# Patient Record
Sex: Female | Born: 1965
Health system: Southern US, Community
[De-identification: ages and names within clinical notes are randomized; demographics above are authoritative.]

## PROBLEM LIST (undated history)

## (undated) DIAGNOSIS — F32A Depression, unspecified: Secondary | ICD-10-CM

## (undated) DIAGNOSIS — I10 Essential (primary) hypertension: Secondary | ICD-10-CM

## (undated) DIAGNOSIS — I639 Cerebral infarction, unspecified: Secondary | ICD-10-CM

## (undated) DIAGNOSIS — M5136 Other intervertebral disc degeneration, lumbar region: Secondary | ICD-10-CM

## (undated) DIAGNOSIS — R569 Unspecified convulsions: Secondary | ICD-10-CM

## (undated) DIAGNOSIS — F329 Major depressive disorder, single episode, unspecified: Secondary | ICD-10-CM

## (undated) DIAGNOSIS — E785 Hyperlipidemia, unspecified: Secondary | ICD-10-CM

## (undated) DIAGNOSIS — M51369 Other intervertebral disc degeneration, lumbar region without mention of lumbar back pain or lower extremity pain: Secondary | ICD-10-CM

## (undated) DIAGNOSIS — Z8619 Personal history of other infectious and parasitic diseases: Secondary | ICD-10-CM

## (undated) HISTORY — DX: Depression, unspecified: F32.A

## (undated) HISTORY — DX: Essential (primary) hypertension: I10

## (undated) HISTORY — PX: GALLBLADDER SURGERY: SHX652

## (undated) HISTORY — DX: Personal history of other infectious and parasitic diseases: Z86.19

## (undated) HISTORY — PX: PARTIAL HYSTERECTOMY: SHX80

## (undated) HISTORY — DX: Major depressive disorder, single episode, unspecified: F32.9

---

## 1988-04-01 DIAGNOSIS — I639 Cerebral infarction, unspecified: Secondary | ICD-10-CM | POA: Insufficient documentation

## 1988-04-01 HISTORY — DX: Cerebral infarction, unspecified: I63.9

## 2000-09-26 ENCOUNTER — Emergency Department (HOSPITAL_COMMUNITY): Admission: EM | Admit: 2000-09-26 | Discharge: 2000-09-26 | Payer: Self-pay | Admitting: Internal Medicine

## 2000-09-26 ENCOUNTER — Encounter: Payer: Self-pay | Admitting: Internal Medicine

## 2000-11-20 ENCOUNTER — Encounter: Payer: Self-pay | Admitting: Family Medicine

## 2000-11-20 ENCOUNTER — Ambulatory Visit (HOSPITAL_COMMUNITY): Admission: RE | Admit: 2000-11-20 | Discharge: 2000-11-20 | Payer: Self-pay | Admitting: Family Medicine

## 2001-10-23 ENCOUNTER — Encounter: Payer: Self-pay | Admitting: Family Medicine

## 2001-10-23 ENCOUNTER — Ambulatory Visit (HOSPITAL_COMMUNITY): Admission: RE | Admit: 2001-10-23 | Discharge: 2001-10-23 | Payer: Self-pay | Admitting: Family Medicine

## 2001-12-06 ENCOUNTER — Emergency Department (HOSPITAL_COMMUNITY): Admission: EM | Admit: 2001-12-06 | Discharge: 2001-12-06 | Payer: Self-pay | Admitting: *Deleted

## 2002-10-29 ENCOUNTER — Emergency Department (HOSPITAL_COMMUNITY): Admission: EM | Admit: 2002-10-29 | Discharge: 2002-10-29 | Payer: Self-pay | Admitting: *Deleted

## 2003-01-28 ENCOUNTER — Inpatient Hospital Stay (HOSPITAL_COMMUNITY): Admission: EM | Admit: 2003-01-28 | Discharge: 2003-01-31 | Payer: Self-pay | Admitting: Internal Medicine

## 2003-02-04 ENCOUNTER — Inpatient Hospital Stay (HOSPITAL_COMMUNITY): Admission: RE | Admit: 2003-02-04 | Discharge: 2003-02-07 | Payer: Self-pay | Admitting: General Surgery

## 2003-02-12 ENCOUNTER — Emergency Department (HOSPITAL_COMMUNITY): Admission: EM | Admit: 2003-02-12 | Discharge: 2003-02-12 | Payer: Self-pay

## 2003-02-15 ENCOUNTER — Ambulatory Visit (HOSPITAL_COMMUNITY): Admission: RE | Admit: 2003-02-15 | Discharge: 2003-02-15 | Payer: Self-pay | Admitting: Family Medicine

## 2003-02-17 ENCOUNTER — Inpatient Hospital Stay (HOSPITAL_COMMUNITY): Admission: EM | Admit: 2003-02-17 | Discharge: 2003-02-19 | Payer: Self-pay | Admitting: Emergency Medicine

## 2003-04-25 ENCOUNTER — Inpatient Hospital Stay (HOSPITAL_COMMUNITY): Admission: EM | Admit: 2003-04-25 | Discharge: 2003-04-26 | Payer: Self-pay | Admitting: Emergency Medicine

## 2003-08-01 ENCOUNTER — Emergency Department (HOSPITAL_COMMUNITY): Admission: EM | Admit: 2003-08-01 | Discharge: 2003-08-01 | Payer: Self-pay | Admitting: Emergency Medicine

## 2004-09-21 ENCOUNTER — Emergency Department (HOSPITAL_COMMUNITY): Admission: EM | Admit: 2004-09-21 | Discharge: 2004-09-21 | Payer: Self-pay | Admitting: Emergency Medicine

## 2004-12-19 ENCOUNTER — Ambulatory Visit (HOSPITAL_COMMUNITY): Admission: RE | Admit: 2004-12-19 | Discharge: 2004-12-19 | Payer: Self-pay | Admitting: Family Medicine

## 2005-03-04 ENCOUNTER — Emergency Department (HOSPITAL_COMMUNITY): Admission: EM | Admit: 2005-03-04 | Discharge: 2005-03-04 | Payer: Self-pay | Admitting: Emergency Medicine

## 2005-08-19 ENCOUNTER — Emergency Department (HOSPITAL_COMMUNITY): Admission: EM | Admit: 2005-08-19 | Discharge: 2005-08-19 | Payer: Self-pay | Admitting: Emergency Medicine

## 2005-10-11 ENCOUNTER — Emergency Department (HOSPITAL_COMMUNITY): Admission: EM | Admit: 2005-10-11 | Discharge: 2005-10-11 | Payer: Self-pay | Admitting: Emergency Medicine

## 2005-12-18 ENCOUNTER — Emergency Department (HOSPITAL_COMMUNITY): Admission: EM | Admit: 2005-12-18 | Discharge: 2005-12-18 | Payer: Self-pay | Admitting: Emergency Medicine

## 2006-01-10 ENCOUNTER — Emergency Department (HOSPITAL_COMMUNITY): Admission: EM | Admit: 2006-01-10 | Discharge: 2006-01-10 | Payer: Self-pay | Admitting: Emergency Medicine

## 2006-02-09 ENCOUNTER — Emergency Department (HOSPITAL_COMMUNITY): Admission: EM | Admit: 2006-02-09 | Discharge: 2006-02-09 | Payer: Self-pay | Admitting: Emergency Medicine

## 2006-04-07 ENCOUNTER — Emergency Department (HOSPITAL_COMMUNITY): Admission: EM | Admit: 2006-04-07 | Discharge: 2006-04-07 | Payer: Self-pay | Admitting: Emergency Medicine

## 2006-07-06 ENCOUNTER — Emergency Department (HOSPITAL_COMMUNITY): Admission: EM | Admit: 2006-07-06 | Discharge: 2006-07-06 | Payer: Self-pay | Admitting: Emergency Medicine

## 2006-08-21 ENCOUNTER — Emergency Department (HOSPITAL_COMMUNITY): Admission: EM | Admit: 2006-08-21 | Discharge: 2006-08-21 | Payer: Self-pay | Admitting: Emergency Medicine

## 2006-09-18 ENCOUNTER — Emergency Department (HOSPITAL_COMMUNITY): Admission: EM | Admit: 2006-09-18 | Discharge: 2006-09-18 | Payer: Self-pay | Admitting: Emergency Medicine

## 2007-02-17 ENCOUNTER — Emergency Department (HOSPITAL_COMMUNITY): Admission: EM | Admit: 2007-02-17 | Discharge: 2007-02-17 | Payer: Self-pay | Admitting: Emergency Medicine

## 2007-03-18 ENCOUNTER — Emergency Department (HOSPITAL_COMMUNITY): Admission: EM | Admit: 2007-03-18 | Discharge: 2007-03-18 | Payer: Self-pay | Admitting: Emergency Medicine

## 2007-05-28 ENCOUNTER — Ambulatory Visit (HOSPITAL_COMMUNITY): Admission: RE | Admit: 2007-05-28 | Discharge: 2007-05-28 | Payer: Self-pay | Admitting: Family Medicine

## 2007-10-26 ENCOUNTER — Emergency Department (HOSPITAL_COMMUNITY): Admission: EM | Admit: 2007-10-26 | Discharge: 2007-10-26 | Payer: Self-pay | Admitting: Emergency Medicine

## 2008-02-03 ENCOUNTER — Emergency Department (HOSPITAL_COMMUNITY): Admission: EM | Admit: 2008-02-03 | Discharge: 2008-02-03 | Payer: Self-pay | Admitting: Emergency Medicine

## 2008-05-30 ENCOUNTER — Inpatient Hospital Stay (HOSPITAL_COMMUNITY): Admission: EM | Admit: 2008-05-30 | Discharge: 2008-06-01 | Payer: Self-pay | Admitting: Emergency Medicine

## 2008-12-08 ENCOUNTER — Emergency Department (HOSPITAL_COMMUNITY): Admission: EM | Admit: 2008-12-08 | Discharge: 2008-12-08 | Payer: Self-pay | Admitting: Emergency Medicine

## 2009-03-29 ENCOUNTER — Emergency Department (HOSPITAL_COMMUNITY): Admission: EM | Admit: 2009-03-29 | Discharge: 2009-03-29 | Payer: Self-pay | Admitting: Emergency Medicine

## 2009-04-25 ENCOUNTER — Ambulatory Visit (HOSPITAL_COMMUNITY): Admission: RE | Admit: 2009-04-25 | Discharge: 2009-04-25 | Payer: Self-pay | Admitting: Family Medicine

## 2009-09-07 ENCOUNTER — Emergency Department (HOSPITAL_COMMUNITY): Admission: EM | Admit: 2009-09-07 | Discharge: 2009-09-07 | Payer: Self-pay | Admitting: Emergency Medicine

## 2010-03-10 ENCOUNTER — Emergency Department (HOSPITAL_COMMUNITY)
Admission: EM | Admit: 2010-03-10 | Discharge: 2010-03-10 | Payer: Self-pay | Source: Home / Self Care | Admitting: Emergency Medicine

## 2010-03-16 ENCOUNTER — Emergency Department (HOSPITAL_COMMUNITY)
Admission: EM | Admit: 2010-03-16 | Discharge: 2010-03-16 | Payer: Self-pay | Source: Home / Self Care | Admitting: Emergency Medicine

## 2010-04-22 ENCOUNTER — Encounter: Payer: Self-pay | Admitting: Family Medicine

## 2010-06-19 ENCOUNTER — Other Ambulatory Visit (HOSPITAL_COMMUNITY): Payer: Self-pay | Admitting: Family Medicine

## 2010-06-19 DIAGNOSIS — Z139 Encounter for screening, unspecified: Secondary | ICD-10-CM

## 2010-06-21 ENCOUNTER — Ambulatory Visit (HOSPITAL_COMMUNITY): Admission: RE | Admit: 2010-06-21 | Payer: Medicare Other | Source: Ambulatory Visit

## 2010-07-01 ENCOUNTER — Emergency Department (HOSPITAL_COMMUNITY)
Admission: EM | Admit: 2010-07-01 | Discharge: 2010-07-02 | Disposition: A | Discharge status: Home / Self Care | Payer: Medicare Other | Attending: Emergency Medicine | Admitting: Emergency Medicine

## 2010-07-01 DIAGNOSIS — A5900 Urogenital trichomoniasis, unspecified: Secondary | ICD-10-CM | POA: Insufficient documentation

## 2010-07-01 DIAGNOSIS — J45909 Unspecified asthma, uncomplicated: Secondary | ICD-10-CM | POA: Insufficient documentation

## 2010-07-01 DIAGNOSIS — E7800 Pure hypercholesterolemia, unspecified: Secondary | ICD-10-CM | POA: Insufficient documentation

## 2010-07-01 DIAGNOSIS — Z8673 Personal history of transient ischemic attack (TIA), and cerebral infarction without residual deficits: Secondary | ICD-10-CM | POA: Insufficient documentation

## 2010-07-01 DIAGNOSIS — R509 Fever, unspecified: Secondary | ICD-10-CM | POA: Insufficient documentation

## 2010-07-01 LAB — URINALYSIS, ROUTINE W REFLEX MICROSCOPIC
Glucose, UA: NEGATIVE mg/dL
Hgb urine dipstick: NEGATIVE
Specific Gravity, Urine: 1.025 (ref 1.005–1.030)
Urobilinogen, UA: 2 mg/dL — ABNORMAL HIGH (ref 0.0–1.0)

## 2010-07-01 LAB — URINE MICROSCOPIC-ADD ON

## 2010-07-03 LAB — URINE CULTURE: Colony Count: NO GROWTH

## 2010-07-12 LAB — BLOOD GAS, ARTERIAL
Acid-base deficit: 7 mmol/L — ABNORMAL HIGH (ref 0.0–2.0)
Bicarbonate: 17.2 mEq/L — ABNORMAL LOW (ref 20.0–24.0)
FIO2: 21 %
O2 Content: 21 L/min
O2 Saturation: 90.4 %
Patient temperature: 37
TCO2: 15.9 mmol/L (ref 0–100)
pCO2 arterial: 29.7 mmHg — ABNORMAL LOW (ref 35.0–45.0)
pH, Arterial: 7.381 (ref 7.350–7.400)
pO2, Arterial: 61 mmHg — ABNORMAL LOW (ref 80.0–100.0)

## 2010-07-12 LAB — CBC
HCT: 38 % (ref 36.0–46.0)
HCT: 38.8 % (ref 36.0–46.0)
HCT: 41.9 % (ref 36.0–46.0)
Hemoglobin: 12.8 g/dL (ref 12.0–15.0)
Hemoglobin: 13.3 g/dL (ref 12.0–15.0)
Hemoglobin: 14.1 g/dL (ref 12.0–15.0)
MCHC: 33.5 g/dL (ref 30.0–36.0)
MCHC: 33.8 g/dL (ref 30.0–36.0)
MCHC: 34.4 g/dL (ref 30.0–36.0)
MCV: 93 fL (ref 78.0–100.0)
MCV: 93.1 fL (ref 78.0–100.0)
MCV: 93.3 fL (ref 78.0–100.0)
Platelets: 200 10*3/uL (ref 150–400)
Platelets: 231 K/uL (ref 150–400)
RBC: 4.07 MIL/uL (ref 3.87–5.11)
RBC: 4.17 MIL/uL (ref 3.87–5.11)
RBC: 4.5 MIL/uL (ref 3.87–5.11)
RDW: 13 % (ref 11.5–15.5)
RDW: 13.1 % (ref 11.5–15.5)
RDW: 13.3 % (ref 11.5–15.5)
WBC: 11.9 K/uL — ABNORMAL HIGH (ref 4.0–10.5)
WBC: 6.3 10*3/uL (ref 4.0–10.5)

## 2010-07-12 LAB — BASIC METABOLIC PANEL
CO2: 18 mEq/L — ABNORMAL LOW (ref 19–32)
CO2: 24 mEq/L (ref 19–32)
Chloride: 100 mEq/L (ref 96–112)
Chloride: 113 mEq/L — ABNORMAL HIGH (ref 96–112)
GFR calc Af Amer: >60 mL/min (ref >60)
GFR calc Af Amer: >60 mL/min (ref >60)
Glucose, Bld: 119 mg/dL — ABNORMAL HIGH (ref 70–99)
Potassium: 3.6 mEq/L (ref 3.5–5.1)
Potassium: 3.8 mEq/L (ref 3.5–5.1)
Sodium: 134 mEq/L — ABNORMAL LOW (ref 135–145)
Sodium: 140 mEq/L (ref 135–145)

## 2010-07-12 LAB — DIFFERENTIAL
Basophils Absolute: 0 10*3/uL (ref 0.0–0.1)
Basophils Relative: 0 % (ref 0–1)
Basophils Relative: 0 % (ref 0–1)
Eosinophils Absolute: 0 10*3/uL (ref 0.0–0.7)
Eosinophils Absolute: 0 10*3/uL (ref 0.0–0.7)
Eosinophils Relative: 0 % (ref 0–5)
Eosinophils Relative: 0 % (ref 0–5)
Lymphocytes Relative: 15 % (ref 12–46)
Lymphocytes Relative: 7 % — ABNORMAL LOW (ref 12–46)
Lymphs Abs: 0.6 10*3/uL — ABNORMAL LOW (ref 0.7–4.0)
Lymphs Abs: 0.8 10*3/uL (ref 0.7–4.0)
Lymphs Abs: 1.1 10*3/uL (ref 0.7–4.0)
Monocytes Absolute: 0.2 10*3/uL (ref 0.1–1.0)
Monocytes Absolute: 0.6 10*3/uL (ref 0.1–1.0)
Monocytes Relative: 2 % — ABNORMAL LOW (ref 3–12)
Monocytes Relative: 9 % (ref 3–12)
Neutro Abs: 5.4 10*3/uL (ref 1.7–7.7)
Neutrophils Relative %: 76 % (ref 43–77)
Neutrophils Relative %: 88 % — ABNORMAL HIGH (ref 43–77)
Neutrophils Relative %: 90 % — ABNORMAL HIGH (ref 43–77)

## 2010-07-12 LAB — CULTURE, BLOOD (ROUTINE X 2)
Culture: NO GROWTH
Culture: NO GROWTH
Report Status: 3062010
Report Status: 3062010

## 2010-07-12 LAB — BASIC METABOLIC PANEL WITH GFR
BUN: 11 mg/dL (ref 6–23)
CO2: 20 meq/L (ref 19–32)
Calcium: 8.6 mg/dL (ref 8.4–10.5)
Chloride: 112 meq/L (ref 96–112)
Creatinine, Ser: 0.71 mg/dL (ref 0.4–1.2)
GFR calc non Af Amer: >60 mL/min
Glucose, Bld: 177 mg/dL — ABNORMAL HIGH (ref 70–99)
Potassium: 3.9 meq/L (ref 3.5–5.1)
Sodium: 140 meq/L (ref 135–145)

## 2010-07-12 LAB — PROTIME-INR: INR: 1 (ref 0.00–1.49)

## 2010-08-14 NOTE — Discharge Summary (Signed)
[name], [name] NO.:  [id number]   MEDICAL RECORD NO.:  [id number]          PATIENT TYPE:  INP   LOCATION:  A310                          FACILITY:  [facility]   PHYSICIAN:  Dorris Singh, DO    DATE OF BIRTH:  [birth year]   DATE OF ADMISSION:  05/30/2008  DATE OF DISCHARGE:  03/03/2010LH                               DISCHARGE SUMMARY   PRIMARY CARE PHYSICIAN:  Dr. Loleta Chance.   TESTING:  Portable chest which shows new linear opacities in both  perihilar regions which may be due to atelectasis or bronchial  pneumonia.   Her H and P was done by Dr. Elige Radon.  Please refer to HPI.  She was  admitted for community acquired pneumonia and exacerbation of asthma.   DISCHARGE DIAGNOSES:  1. Acute exacerbation of asthma, resolved.  2. Pneumonia, resolving.  3. History of reflux, depression, cerebrovascular accident, high      cholesterol.   HOSPITAL COURSE:  The patient was admitted with the above diagnoses.  She was started on IV steroids as well as breathing treatments around  the clock.  She continued to improve without any incidents.  I also  talked with the patient regarding her medications that she has at home.  She just has inhalers.  We will give her a prescription for a nebulizer  to use as well as nebulizing solution.  We will have her follow up with  her doctor.   DISCHARGE MEDICATIONS:  1. The patient did not bring in a list from home but taking this from      her H and P, Depakote 250 mg b.i.d.  2. Albuterol inhaler as needed.  3. Singulair 10 mg once a day.  4. Simvastatin 20 mg at bedtime.  5. She will be sent home on 3 new medications which include albuterol      2.5 mg to use with nebulizer treatment 4 times a day p.r.n.  6. Medrol Dosepak which she will use as directed.  7. Levaquin 750 one p.o. daily x7 days.   DISCHARGE INSTRUCTIONS:  She is to increase her activity slowly.  She is  to remain on a regular diet with no restrictions.  She is to  see Dr.  Margo Aye within the next 1-5 days for followup.  She is to continue the  nebulizer treatments  as directed and all medications to resume as directed from previous.  If  her symptoms worsen she is instructed to follow back up with her primary  care physician or to return to the emergency room for evaluation.   DISPOSITION:  Stable.  She will be discharged to home.      Dorris Singh, DO  Electronically Signed     CB/MEDQ  D:  06/01/2008  T:  06/01/2008  Job:  [id number]

## 2010-08-14 NOTE — H&P (Signed)
Patty [name], WARDA NO.:  [id number]   MEDICAL RECORD NO.:  [id number]          PATIENT TYPE:  INP   LOCATION:  A310                          FACILITY:  [facility]   PHYSICIAN:  Dorris Singh, DO    DATE OF BIRTH:  07-02-1965   DATE OF ADMISSION:  05/30/2008  DATE OF DISCHARGE:  LH                              HISTORY & PHYSICAL   CHIEF COMPLAINT:  The patient is a 45 year old African American female  who presented to [facility] emergency room with the chief complaint of  shortness of breath.  She apparently stated that over the last couple  days she had noticed that her asthma, for which she takes inhalers, was  not  responding to her use of inhalers.  She currently is a patient of  Dr. Loleta Chance so she came in to be evaluated.  She was found to have a  questionable pneumonia and exacerbation of her asthma with pyrexia.   PAST MEDICAL HISTORY:  1. Significant for having some acid reflux.  2. CVA.  3. Depression.  4. High cholesterol.  5. Seizure disorder.   FAMILY HISTORY:  Hypertension.   SURGICAL HISTORY:  She has had cesarean section and hysterectomy.   SOCIAL HISTORY:  Nonsmoker, no drug use.  Occasional drinker.  Does have  a boyfriend.   ALLERGIES:  No known drug allergies.   CURRENT MEDICATIONS:  1. Depakote 250 mg t.i.d..  2. Albuterol inhaler as needed.  3. Singulair 10 mg once a day.  4. Simvastatin 20 mg at bedtime.   REVIEW OF SYSTEMS:  CONSTITUTIONAL:  Positive for fever.  Negative for  weight loss, chills.  EYES:  Negative for eye pain or visual changes.  NOSE, MOUTH, THROAT:  Negative for ear pain.  Denies sinus congestion.  CARDIOVASCULAR:  Denies chest pain or dyspnea.  RESPIRATORY:  Positive  for dyspnea and wheezing and shortness of breath.  GI:  Nausea and  vomiting.  GU:  Negative dysuria and flank pain.  MUSCULOSKELETAL:  Negative for arthralgias or back pain.  SKIN:  Positive for pruritus and  rash.  NEURO:  Negative for headache or  weakness.   LABORATORY DATA:  White count of 7.1,  hemoglobin 14.1 hematocrit 41.9,  platelet count 198,000.  BMP shows sodium 134, potassium 3.6, chloride  100, carbon dioxide 24, glucose 119, BUN 10, creatinine 0.93.   Chest x-ray, one-view, shows new opacity in both perihilar regions which  may be due to atelectasis or bronchopneumonia.   ASSESSMENT/PLAN:  Community-acquired pneumonia, acute exacerbation of  asthma.   PLAN:  Admit the patient to service of [facility] and start her on DVT  and GI prophylaxis.  She was given one dose of Rocephin and Zithromax.  We will continue that while she is here.  Will continue nebulizer  treatments, albuterol and Atrovent, as well as IV hydration and  antitussives.  I will continue to monitor her.  If she is greatly  improved, she may be able to be discharged next 24 hours.  However, she  may need to stay for a duration  longer than that depending on how she  progresses.      Dorris Singh, DO  Electronically Signed     CB/MEDQ  D:  05/30/2008  T:  05/30/2008  Job:  [id number]
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
## 2010-08-17 NOTE — Op Note (Signed)
NAME:  Patty, [name]                            ACCOUNT NO.:  [id number]   MEDICAL RECORD NO.:  [id number]                   PATIENT TYPE:  AMB   LOCATION:  DAY                                  FACILITY:  [facility]   PHYSICIAN:  Jerolyn Shin C. Katrinka Blazing, M.D.                DATE OF BIRTH:  1966-02-27   DATE OF PROCEDURE:  DATE OF DISCHARGE:                                 OPERATIVE REPORT   PREOPERATIVE DIAGNOSES:  1. Severe dysfunctional uterine bleeding.  2. Massive uterine fibroids.  3. Severe anemia.   POSTOPERATIVE DIAGNOSES:  1. Severe dysfunctional uterine bleeding.  2. Massive uterine fibroids.  3. Severe anemia.  4. Cholelithiasis.   PROCEDURE:  Total abdominal hysterectomy.   DESCRIPTION OF PROCEDURE:  Under general anesthesia, the patient's abdomen  was prepped and draped in a sterile field.   A midline incision was made and extended above the umbilicus because of the  large size of the uterus.  The uterus extended about four fingerbreadths  above the umbilicus, and it could not be pulled from the pelvis without a  larger incision.  Once the uterus was pulled from the pelvis, evaluation of  the upper abdomen was done.  The patient was found to have two large  gallstones in the gallbladder without any palpable gallbladder wall  thickening.  No palpable pericholecystic adhesions.  The uterus was grasped  with a tenaculum.  The round ligaments were isolated, doubly clamped, tied,  and divided.  The tubo-ovarian complex was doubly clamped at its junction  with the uterus bilaterally.  Each was controlled with a Heaney ligature of  0 Dexon.  The initial dissection was started on the left side.  The  infundibulopelvic ligament was significantly vascularized due to the massive  size of the uterus.  IT was doubly clamped, divided, and controlled with  ligatures of 0 Dexon.  The uterine vessels were singly clamped, divided, and  controlled with ligatures of 0 Dexon.  A lot of the end  vessels had to be  sutured because of significant backbleeding.  There was major backbleeding  all the way down to the cervix.  Estimated blood loss was about 1300 cc.  There were large major vessels both arterial and venous.  Once the  dissection was continued down to the apex of the vagina, the apex of the  vagina was clamped with a curved Heaney clamp, incised, and then closed with  a ligature of 0 Dexon.  The uterus was excised.  Hemostasis was achieved.  The cuff was oversewn using running locking 0 Biosyn.  The pelvis was  irrigated.  The pelvis was then reperitonealized using 3-0 Biosyn.  The  ovaries were inspected, and there was no evidence of bleeding there.  Sponge, needle, instrument, and blade counts were verified as correct.  The  fascia was closed using running #1 Prolene.  The subcutaneous tissue  was  closed in two layers using 2-0 Biosyn.  The skin was closed with staples.   The patient tolerated the procedure well.  She was awakened from anesthesia  uneventfully, transferred to a bed, and taken to the postanesthetic care  unit in satisfactory condition.      ___________________________________________                                            Dirk Dress Katrinka Blazing, M.D.   LCS/MEDQ  D:  02/04/2003  T:  02/04/2003  Job:  [id number]

## 2010-08-17 NOTE — Discharge Summary (Signed)
   NAME:  Patty [name], Patty [name]                            ACCOUNT NO.:  [id number]   MEDICAL RECORD NO.:  [id number]                   PATIENT TYPE:  INP   LOCATION:  A311                                 FACILITY:  [facility]   PHYSICIAN:  Vania Rea, M.D.              DATE OF BIRTH:  1965/07/21   DATE OF ADMISSION:  01/28/2003  DATE OF DISCHARGE:                                 DISCHARGE SUMMARY   DISCHARGE DIAGNOSES:  1. Anemia status post transfusion of 2 units of packed red cells.  2. Menorrhagia, not currently bleeding.  3. Large uterine fibroids, total abdominal hysterectomy pending.  4. Seizure disorder.  5. History of cerebrovascular accident.  6. Asthma.  7. History of oral Candida; human immunodeficiency virus test negative.   DISPOSITION:  Discharged to home.   CONDITION:  Stable.   DISCHARGE MEDICATIONS:  1. Ferrous sulfate 325 mg three times daily.  2. Depakote 250 mg three times daily.  3. Albuterol inhaler two puffs q.4h. when necessary.   Please refer to History and Physical of January 28, 2003.  The patient was  admitted with a one-day history of dizziness and presyncope.  The patient  gave a long history of menorrhagia.  The patient was found to have uterine  fibroids.  The patient's hemoglobin was found to be 6 on admission.  The  patient was not bleeding at the time of admission.  Blood work suggested a  microcytic anemia.  She was transfused 2 units of blood and was offered  total abdominal hysterectomy by the consulting surgeon, Dr. Elpidio Anis.  The patient agreed and was kept.  The surgery was scheduled for today.  However, today the patient has declined surgery, stating personal problems  needing to be attended to.  The patient is discharged with last hemoglobin  of 10, hematocrit 31.5.  She is ambulating without  difficulty.  She feels much better than when she was admitted.  Naturally  she has been advised to repeat her CBC on Thursday, to return for an  elective TAH on Friday of this week by Dr. Katrinka Blazing.   FOLLOW-UP:  By her primary care physician, Dr. Mirna Mires, and for surgery  on November 5 with Dr. Katrinka Blazing.     ___________________________________________                                         Vania Rea, M.D.   LC/MEDQ  D:  01/31/2003  T:  01/31/2003  Job:  [id number]   cc:   Annia Friendly. Loleta Chance, M.D.  [address]  [place]  [place] [address]  Fax: [phone]   Dirk Dress. Katrinka Blazing, M.D.  [address]  [place]  [place] [address]  Fax: [phone]

## 2010-08-17 NOTE — H&P (Signed)
   NAME:  Patty [name], Patty [name]                            ACCOUNT NO.:  [id number]   MEDICAL RECORD NO.:  [id number]                   PATIENT TYPE:  AMB   LOCATION:  DAY                                  FACILITY:  [facility]   PHYSICIAN:  Jerolyn Shin C. Katrinka Blazing, M.D.                DATE OF BIRTH:  28-Dec-1965   DATE OF ADMISSION:  DATE OF DISCHARGE:                                HISTORY & PHYSICAL   ADDENDUM:  [age]-year-old female admitted on 28 January 2003 with  severe anemia.  The patient has a long history of dysfunctional uterine  bleeding and menometrorrhagia.  She has had large uterine fibroid dating  back to 2002.  She was advised at that time to have hysterectomy.  She has  continued to be symptomatic.  The patient was admitted and transfused.  After transfusion, she was advised to have surgery but she wished to make  arrangement for her children.  Those problems have been completed and she is  now scheduled for total abdominal hysterectomy.     ___________________________________________                                         Dirk Dress Katrinka Blazing, M.D.   LCS/MEDQ  D:  02/03/2003  T:  02/04/2003  Job:  [id number]

## 2010-08-17 NOTE — Discharge Summary (Signed)
NAME:  Patty [name], Patty [name]                            ACCOUNT NO.:  [id number]   MEDICAL RECORD NO.:  [id number]                   PATIENT TYPE:  INP   LOCATION:  A322                                 FACILITY:  [facility]   PHYSICIAN:  Hanley Hays. Dechurch, M.D.           DATE OF BIRTH:  1965/12/19   DATE OF ADMISSION:  04/25/2003  DATE OF DISCHARGE:  04/26/2003                                 DISCHARGE SUMMARY   DIAGNOSES:  1. Status asthmaticus.  2. Probable reactive airway disease.  3. History of seizure disorder.  4. History of cerebrovascular accident.  5. History of hypoxic encephalopathy.  6. Status post total abdominal hysterectomy November 2004.   DISPOSITION:  The patient discharged to home.  Follow up with Dr. Loleta Chance in 2  weeks.   CONDITION:  Much improved.   DISCHARGE MEDICATIONS:  1. Prednisone 30 mg daily x2 days, 20 mg daily x3 days, 10 mg daily x3 days,     then discontinue.  2. Advair 250/50 one puff b.i.d.  3. Albuterol 2 puffs q.4h. p.r.n. wheezing cough.  4. Zithromax daily until gone.  5. Depakote 250 t.i.d.  6. The patient instructed to STOP her iron tablets.   HOSPITAL COURSE:  A 45 year old African-American female followed by Dr. Loleta Chance  who presented to the hospital with respiratory distress which was  progressive over the previous 3-4 days to the point where she could not get  her breath.  She was in moderate respiratory distress at the time of  presentation.  She was tachycardic with rates to 110.  Her O2 saturations  were 91% on room air, but she was working significantly to maintain that.  Chest x-ray revealed no evidence of acute disease.  She was treated in the  emergency room with Solu-Medrol nebulizer therapy, but due to ongoing  respiratory distress was admitted to the hospital. Over the course of the  next 24 hours she had significant improvement with much decreased wheezing.  She still has some scattered rhonchi in the right lower lobe at the time of  discharge, but she is able to move about and feels much much better.  She is  being discharged to home with the follow up as noted above.   PHYSICAL EXAMINATION:  GENERAL:  Exam reveals a moderately obese female in  no distress.  VITAL SIGNS:  No fever with a T-max of 97.9.  Blood pressure is 118/72,  pulse is 90 and regular.  Respirations are unlabored.  Oropharynx is moist,  no thrush.  LUNGS:  Diminished with improved breath sounds, a few scattered fine rhonchi  in the right base, but otherwise much improved. No rales are present.  EXTREMITIES:  No edema is present.  NEUROLOGIC:  Status is baseline, intact, appropriate, and nonfocal.  ABDOMEN:  Obese, soft, and nontender.   ASSESSMENT AND PLAN:  As noted above.  The patient was instructed on the use  of Advair and albuterol for rescue treatment.  Her appointment with Dr. Loleta Chance  was rescheduled for 2 weeks.  She seemed to have good understanding.  My  phone number was supplied to the patient at the time of discharge in case  she should have any questions or recurrences.     ___________________________________________                                         Hanley Hays Josefine Class, M.D.   FED/MEDQ  D:  04/26/2003  T:  04/26/2003  Job:  [id number]   cc:   Annia Friendly. Loleta Chance, M.D.  [address]  [place]  [place] [address]  Fax: [phone]

## 2010-08-17 NOTE — Discharge Summary (Signed)
NAME:  Patty [name], Patty [name]                            ACCOUNT NO.:  [id number]   MEDICAL RECORD NO.:  [id number]                   PATIENT TYPE:  INP   LOCATION:  A203                                 FACILITY:  [facility]   PHYSICIAN:  Annia Friendly. Loleta Chance, M.D.                DATE OF BIRTH:  Feb 23, 1966   DATE OF ADMISSION:  02/17/2003  DATE OF DISCHARGE:  02/19/2003                                 DISCHARGE SUMMARY   The patient was a 45 year old, divorced, gravida 2, para 2, AB 0, disabled,  black female from [place], [place].  The patient was transported  to the emergency room at [facility] by EMS for treatment of  shortness of breath.  EMS was summoned to the patient's home on the morning  of admission.  EMS found the patient sitting in a room and she was observed  to be very short of breath and in distress.  O2 saturation on room air was  74%.  She was given albuterol neb treatment with improvement and O2  saturation to 99% with a mask.  Also, an IV was started and ER physician  gave permission for IV Solu-Medrol 125 mg IV.  It should be noted the  patient had been seen in the emergency room, on February 12, 2003, for  shortness of breath and wheezing.  She indicated that treatment was a neb  treatment, and she was released.  History is also positive for patient being  seen in the office, on November 16 or 17, 2004, for a productive cough,  wheezing and a complaint of shortness of breath.  Her O2 saturation in the  office was 93% on room air.  The patient was treated in the office with  antibiotics, antitussive medication and metered-dose inhaler.  The patient  also received Xopenex neb treatment with each visit in the office.  ABGs, on  February 15, 2003, room air revealed the following:  pH 7.49, pCO2 32.3, pO2  71.7, O2 sat 99.6%.   HABITS:  Negative for tobacco, ethanol and street drugs.   PAST MEDICAL HISTORY:  1. Positive hospitalization for hysterectomy secondary to  dysfunctional     uterine bleeding, on February 12, 2003.  2. Positive hospitalization for hypoxic encephalopathy secondary to, with     residual right chronic hemiparesis and seizure disorder.  3. Positive hospitalization for pregnancy x2 (C-section with second     pregnancy secondary to fetal distress).  4. Bilateral tubal ligation by Dr. Kate Sable at [facility], on     August 31.   As a part of the workup, by the ER, on February 17, 2003, a CT of the chest  was ordered.  A spiral CT of the chest was negative for pulmonary embolic  disease.  Chest x-ray was read normal by ER physician.  A venous Doppler  study of the lower extremities, on February 17, 2003, was negative.   The patient was not allergic to any known medication.   FAMILY HISTORY:  Reveals mother living at age 46 with a history of  hypertension.  Father deceased secondary to a complication of stroke.  Seven  sisters living, health unknown.  One sister deceased, age 31, secondary to  being hit by a car.  Six brothers living, health unknown.  One son living,  age 74, with a history of asthma.  One daughter living, age 81, good health.   PROBLEM LIST:  Problem #1.  Shortness of breath secondary to acute  bronchitis with bronchospasm.   PHYSICAL EXAMINATION:  VITAL SIGNS:  On admission were as follows:  Temperature 99.0, pulse 112, respirations 20, blood pressure 123/63.  GENERAL APPEARANCE:  Revealed a middle-aged, slightly short, alert, medium  framed black female in no apparent respiratory distress or arrest.  NOSE:  Negative for discharge.  EYES:  Demonstrated no discharge.  Sclera were white.  THROAT:  Posterior pharynx was benign.  NECK:  Negative for lymphadenopathy.  LUNGS:  Positive bilateral inspiratory wheezes.  The patient was moving air  fairly on auscultation.  HEART:  Revealed audible S1 and S2 without murmur.  Rhythm was regular and  rate was 108.  EXTREMITIES:  Demonstrated no cyanosis or  clubbing.   SIGNIFICANT LABS ON ADMISSION:  As follows:  ABGs on room air, pH 7.39, pCO2  39.9, pO2 53.3, and O2 sat 86.5%.  D-dimmer was 1.49 (normal 0-0.4 mcg/ml),  on February 17, 2003.  Labs, on February 12, 2003, revealed the following:  Sodium 140, potassium 3.8, chloride 106, CO2 27, glucose 95, BUN 5,  creatinine 0.6, calcium 8.9.  White count 11.5, hemoglobin 11.9, hematocrit  36.6 and platelets 628,000.   The patient was treated with IV fluids, IV Solu-Medrol, Alupent/Atrovent neb  treatment q.4h., IV antibiotic, oxygen at 4 liters per minute via nasal  cannula, mucolytic agent, antitussive medicine and other supportive  measures.  The patient's hospital course was uneventful.  She improved daily  with treatment.  On the day of discharge her lungs were clear.  She was  alert and oriented to person, place and time.  She had no complaints of  shortness of breath at rest or ambulating in room.  ABGs on room air, on  February 19, 2003, demonstrated the following:  pH 7.40, pCO2 33.8, pO2  82.6, O2 sat 96.7%.  Appetite was good.  The patient was discharged to her  home on February 19, 2003.   1. Chronic right hemiparesis:  Examination of extremities on admission     revealed decreased strength involving the right upper and lower     extremities.  The patient demonstrated spastic movement of her right     upper extremity.  Fine motor movement of fingers was poor.  The patient     admitted to feeding herself with the left hand.  She demonstrated no     motor problem or trouble with control or strength pertaining to the left     upper and lower extremities.  Speech was slow but clear.   1. Seizure disorder:  Valproic acid level on admission was 63.6 ng/ml.  The     patient was continued on Depakote 250 mg one tablet every morning, two     tablets p.o. every bedtime.  She demonstrated no seizure activity during    this hospitalization.  She was always alert and oriented  to person,      place  and time.  Short and long term memory were intact.   DISCHARGE INSTRUCTIONS:   DIET:  Regular.   ACTIVITY:  As tolerated.   MEDICATIONS:  1. Albuterol metered-dose inhaler two puffs every four to six hours with     AeroChamber.  2. Doxycycline 100 mg p.o. b.i.d.  3. Tussi-Organidin NR two teaspoons p.o. q.i.d.  4. Depakote 250 mg one tablet every morning and two tablets p.o. every     bedtime.  5. Prednisone 5 mg p.o. t.i.d. x 5 days.   FINAL PRIMARY DIAGNOSIS:  Shortness of breath secondary to acute bronchitis  with bronchospasm.   SECONDARY DIAGNOSES:  1. Chronic right hemiparesis secondary to hypoxic encephalopathy.  2. Seizure disorder.     ___________________________________________                                         Annia Friendly. Loleta Chance, M.D.   Levonne Hubert  D:  02/19/2003  T:  02/20/2003  Job:  [id number]
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
## 2010-08-17 NOTE — Discharge Summary (Signed)
NAME:  Patty [name], Patty [name]                            ACCOUNT NO.:  [id number]   MEDICAL RECORD NO.:  [id number]                   PATIENT TYPE:  INP   LOCATION:  A321                                 FACILITY:  [facility]   PHYSICIAN:  Dirk Dress. Katrinka Blazing, M.D.                DATE OF BIRTH:  1965-05-26   DATE OF ADMISSION:  02/04/2003  DATE OF DISCHARGE:  02/07/2003                                 DISCHARGE SUMMARY   DISCHARGE DIAGNOSES:  1. Dysfunctional uterine bleeding.  2. Uterine fibroids.  3. Cholelithiasis.  4. Hemorrhagic anemia.  5. Seizure disorder.  6. History of asthma.   DISPOSITION:  The patient is discharged home in stable and satisfactory  condition.   DISCHARGE MEDICATIONS:  1. Tylox one to two every four hours as needed for pain.  2. Depakote 250 mg t.i.d.  3. Albuterol inhaler two puffs q.i.d.   HISTORY OF PRESENT ILLNESS:  This is a 45 year old female who presented on  October 29, with complaint of dizziness and weakness.  She had a long  history of uterine fibroids and severe menorrhagia.  She had significant  orthostatic dizziness and was seen in the emergency room.  She was noted to  have a hemoglobin of 6.2 with hematocrit of 20.7.  The patient was admitted,  transfused and had consultation for hysterectomy obtained.  She was advised  to have hysterectomy, but she needed to make arrangements for the children.   HOSPITAL COURSE:  She was treated until her hemoglobin was stabilized.  Once  hemoglobin was stabilized, the patient was discharged home.  She was  readmitted on November 5, and underwent hysterectomy.  At the time of  hysterectomy, she was found to have multiple gallstones.  She had a very  large uterus and had 1300 cc of blood loss mostly from back bleeding.  She  was transfused two units of packed red cells during that procedure.  Her  post procedure hemoglobin was 11.3.  She had no further problems.  She had  no respiratory difficulty.  By November 8, she was  stable and hemoglobin was  10.4.  She was discharged home in satisfactory condition.     ___________________________________________                                         Dirk Dress Katrinka Blazing, M.D.   LCS/MEDQ  D:  04/03/2003  T:  04/04/2003  Job:  [id number]

## 2010-09-19 ENCOUNTER — Emergency Department (HOSPITAL_COMMUNITY): Payer: Medicare Other

## 2010-09-19 ENCOUNTER — Emergency Department (HOSPITAL_COMMUNITY)
Admission: EM | Admit: 2010-09-19 | Discharge: 2010-09-19 | Disposition: A | Discharge status: Home / Self Care | Payer: Medicare Other | Attending: Emergency Medicine | Admitting: Emergency Medicine

## 2010-09-19 DIAGNOSIS — IMO0002 Reserved for concepts with insufficient information to code with codable children: Secondary | ICD-10-CM | POA: Insufficient documentation

## 2010-09-19 LAB — DIFFERENTIAL
Basophils Absolute: 0 K/uL (ref 0.0–0.1)
Basophils Relative: 0 % (ref 0–1)
Eosinophils Absolute: 0.3 K/uL (ref 0.0–0.7)
Eosinophils Relative: 6 % — ABNORMAL HIGH (ref 0–5)
Lymphocytes Relative: 42 % (ref 12–46)
Lymphs Abs: 2.3 10*3/uL (ref 0.7–4.0)
Monocytes Absolute: 0.4 K/uL (ref 0.1–1.0)
Monocytes Relative: 6 % (ref 3–12)
Neutro Abs: 2.6 10*3/uL (ref 1.7–7.7)
Neutrophils Relative %: 46 % (ref 43–77)

## 2010-09-19 LAB — CBC
HCT: 40.5 % (ref 36.0–46.0)
Hemoglobin: 13.6 g/dL (ref 12.0–15.0)
MCH: 31.1 pg (ref 26.0–34.0)
MCHC: 33.6 g/dL (ref 30.0–36.0)
MCV: 92.7 fL (ref 78.0–100.0)
Platelets: 260 K/uL (ref 150–400)
RBC: 4.37 MIL/uL (ref 3.87–5.11)
RDW: 13.2 % (ref 11.5–15.5)
WBC: 5.6 K/uL (ref 4.0–10.5)

## 2010-09-19 LAB — BASIC METABOLIC PANEL
CO2: 26 mEq/L (ref 19–32)
Calcium: 10.1 mg/dL (ref 8.4–10.5)
Creatinine, Ser: 0.66 mg/dL (ref 0.50–1.10)
Glucose, Bld: 92 mg/dL (ref 70–99)
Sodium: 138 mEq/L (ref 135–145)

## 2010-09-19 LAB — BASIC METABOLIC PANEL WITH GFR
BUN: 12 mg/dL (ref 6–23)
Chloride: 103 meq/L (ref 96–112)
GFR calc Af Amer: >60 mL/min (ref >60)
GFR calc non Af Amer: >60 mL/min (ref >60)
Potassium: 3.5 meq/L (ref 3.5–5.1)

## 2010-10-18 ENCOUNTER — Emergency Department (HOSPITAL_COMMUNITY)
Admission: EM | Admit: 2010-10-18 | Discharge: 2010-10-18 | Disposition: A | Discharge status: Home / Self Care | Payer: Medicare Other | Attending: Emergency Medicine | Admitting: Emergency Medicine

## 2010-10-18 ENCOUNTER — Encounter: Payer: Self-pay | Admitting: Emergency Medicine

## 2010-10-18 DIAGNOSIS — N739 Female pelvic inflammatory disease, unspecified: Secondary | ICD-10-CM | POA: Insufficient documentation

## 2010-10-18 DIAGNOSIS — Z8673 Personal history of transient ischemic attack (TIA), and cerebral infarction without residual deficits: Secondary | ICD-10-CM | POA: Insufficient documentation

## 2010-10-18 DIAGNOSIS — E785 Hyperlipidemia, unspecified: Secondary | ICD-10-CM | POA: Insufficient documentation

## 2010-10-18 DIAGNOSIS — G40909 Epilepsy, unspecified, not intractable, without status epilepticus: Secondary | ICD-10-CM | POA: Insufficient documentation

## 2010-10-18 DIAGNOSIS — J45909 Unspecified asthma, uncomplicated: Secondary | ICD-10-CM | POA: Insufficient documentation

## 2010-10-18 DIAGNOSIS — Z79899 Other long term (current) drug therapy: Secondary | ICD-10-CM | POA: Insufficient documentation

## 2010-10-18 HISTORY — DX: Hyperlipidemia, unspecified: E78.5

## 2010-10-18 HISTORY — DX: Cerebral infarction, unspecified: I63.9

## 2010-10-18 HISTORY — DX: Unspecified convulsions: R56.9

## 2010-10-18 MED ORDER — SULFAMETHOXAZOLE-TRIMETHOPRIM 800-160 MG PO TABS: 1.0000 | ORAL_TABLET | Freq: Two times a day (BID) | ORAL | Status: AC

## 2010-10-18 MED ORDER — HYDROCODONE-ACETAMINOPHEN 5-500 MG PO TABS: 1.0000 | ORAL_TABLET | ORAL | Status: AC | PRN

## 2010-10-18 NOTE — ED Notes (Signed)
Pt states she has knot on the rt labia.

## 2010-10-18 NOTE — ED Provider Notes (Signed)
History     Chief Complaint  Patient presents with  . Abscess   Patient is a 45 y.o. female presenting with abscess. The history is provided by the patient.  Abscess  This is a new problem. The current episode started more than one week ago. The problem has been unchanged. The abscess is present on the genitalia. The problem is moderate. The abscess is characterized by redness. Pertinent negatives include no fever and no cough. There were no sick contacts. Recently, medical care has been given by the PCP.    Past Medical History  Diagnosis Date  . Seizures   . Stroke   . Asthma   . Hyperlipidemia     Past Surgical History  Procedure Date  . Partial hyster   . Cesarean section     History reviewed. No pertinent family history.  History  Substance Use Topics  . Smoking status: Never Smoker   . Smokeless tobacco: Not on file  . Alcohol Use: Yes    OB History    Grav Para Term Preterm Abortions TAB SAB Ect Mult Living                  Review of Systems  Constitutional: Negative for fever and activity change.       All ROS Neg except as noted in HPI  HENT: Negative for nosebleeds and neck pain.   Eyes: Negative for photophobia and discharge.  Respiratory: Negative for cough, shortness of breath and wheezing.   Cardiovascular: Negative for chest pain and palpitations.  Gastrointestinal: Negative for abdominal pain and blood in stool.  Genitourinary: Negative for dysuria, frequency and hematuria.  Musculoskeletal: Negative for back pain and arthralgias.  Skin: Negative.   Neurological: Negative for dizziness, seizures and speech difficulty.  Psychiatric/Behavioral: Negative for hallucinations and confusion.    Physical Exam  BP 136/83  Pulse 72  Temp(Src) 98.4 F (36.9 C) (Oral)  Resp 20  Ht 5\' 2"  (1.575 m)  Wt 192 lb (87.091 kg)  BMI 35.12 kg/m2  SpO2 97%  Physical Exam  Nursing note and vitals reviewed. Constitutional: She is oriented to person, place,  and time. She appears well-developed and well-nourished.  Non-toxic appearance.  HENT:  Head: Normocephalic.  Right Ear: Tympanic membrane and external ear normal.  Left Ear: Tympanic membrane and external ear normal.  Eyes: EOM and lids are normal. Pupils are equal, round, and reactive to light.  Neck: Normal range of motion. Neck supple. Carotid bruit is not present.  Cardiovascular: Normal rate, regular rhythm, normal heart sounds, intact distal pulses and normal pulses.   Pulmonary/Chest: Breath sounds normal. No respiratory distress.  Abdominal: Soft. Bowel sounds are normal. There is no tenderness. There is no guarding.  Genitourinary:       2 small abscess of the right external labia of the vagina.  Musculoskeletal: Normal range of motion.  Lymphadenopathy:       Head (right side): No submandibular adenopathy present.       Head (left side): No submandibular adenopathy present.    She has no cervical adenopathy.  Neurological: She is alert and oriented to person, place, and time. She has normal strength. No cranial nerve deficit or sensory deficit.  Skin: Skin is warm and dry.  Psychiatric: She has a normal mood and affect. Her speech is normal.    ED Course  Procedures  MDM I have reviewed nursing notes, vital signs, and all appropriate lab and imaging results for this patient.  Kathie Dike, [place] 10/18/10 1526

## 2010-10-18 NOTE — ED Notes (Signed)
Patient with no complaints at this time. Respirations even and unlabored. Skin warm/dry. Discharge instructions reviewed with patient at this time. Patient given opportunity to voice concerns/ask questions. Patient discharged at this time and left Emergency Department with steady gait.   

## 2010-11-27 ENCOUNTER — Ambulatory Visit (HOSPITAL_COMMUNITY)
Admission: RE | Admit: 2010-11-27 | Discharge: 2010-11-27 | Disposition: A | Discharge status: Home / Self Care | Payer: Medicare Other | Source: Ambulatory Visit | Attending: Family Medicine | Admitting: Family Medicine

## 2010-11-27 DIAGNOSIS — Z1231 Encounter for screening mammogram for malignant neoplasm of breast: Secondary | ICD-10-CM | POA: Insufficient documentation

## 2010-11-27 DIAGNOSIS — Z139 Encounter for screening, unspecified: Secondary | ICD-10-CM

## 2011-01-04 LAB — BLOOD GAS, ARTERIAL
Bicarbonate: 22.1
Patient temperature: 37
TCO2: 19.4
pH, Arterial: 7.434 — ABNORMAL HIGH

## 2011-05-07 DIAGNOSIS — R569 Unspecified convulsions: Secondary | ICD-10-CM | POA: Diagnosis not present

## 2011-05-07 DIAGNOSIS — E781 Pure hyperglyceridemia: Secondary | ICD-10-CM | POA: Diagnosis not present

## 2011-05-22 ENCOUNTER — Other Ambulatory Visit: Payer: Self-pay | Admitting: Family Medicine

## 2011-05-22 DIAGNOSIS — M545 Low back pain: Secondary | ICD-10-CM

## 2011-05-30 ENCOUNTER — Ambulatory Visit
Admission: RE | Admit: 2011-05-30 | Discharge: 2011-05-30 | Disposition: A | Discharge status: Home / Self Care | Payer: Medicare Other | Source: Ambulatory Visit | Attending: Family Medicine | Admitting: Family Medicine

## 2011-05-30 DIAGNOSIS — M545 Low back pain: Secondary | ICD-10-CM

## 2011-05-30 DIAGNOSIS — M5137 Other intervertebral disc degeneration, lumbosacral region: Secondary | ICD-10-CM | POA: Diagnosis not present

## 2011-05-30 DIAGNOSIS — R209 Unspecified disturbances of skin sensation: Secondary | ICD-10-CM | POA: Diagnosis not present

## 2011-05-30 DIAGNOSIS — M5126 Other intervertebral disc displacement, lumbar region: Secondary | ICD-10-CM | POA: Diagnosis not present

## 2011-09-12 ENCOUNTER — Encounter (HOSPITAL_COMMUNITY): Payer: Self-pay | Admitting: *Deleted

## 2011-09-12 ENCOUNTER — Emergency Department (HOSPITAL_COMMUNITY)
Admission: EM | Admit: 2011-09-12 | Discharge: 2011-09-12 | Disposition: A | Discharge status: Home / Self Care | Payer: Medicare Other | Attending: Emergency Medicine | Admitting: Emergency Medicine

## 2011-09-12 DIAGNOSIS — L02219 Cutaneous abscess of trunk, unspecified: Secondary | ICD-10-CM | POA: Insufficient documentation

## 2011-09-12 DIAGNOSIS — E785 Hyperlipidemia, unspecified: Secondary | ICD-10-CM | POA: Diagnosis not present

## 2011-09-12 DIAGNOSIS — L03319 Cellulitis of trunk, unspecified: Secondary | ICD-10-CM | POA: Insufficient documentation

## 2011-09-12 DIAGNOSIS — L0291 Cutaneous abscess, unspecified: Secondary | ICD-10-CM

## 2011-09-12 MED ORDER — DOXYCYCLINE HYCLATE 100 MG PO CAPS: 100.0000 mg | ORAL_CAPSULE | Freq: Two times a day (BID) | ORAL | Status: AC

## 2011-09-12 NOTE — Discharge Instructions (Signed)
Abscess An abscess (boil or furuncle) is an infected area under your skin. This area is filled with yellowish white fluid (pus). HOME CARE   Only take medicine as told by your doctor.   Keep the skin clean around your abscess. Keep clothes that may touch the abscess clean.   Change any bandages (dressings) as told by your doctor.   Avoid direct skin contact with other people. The infection can spread by skin contact with others.   Practice good hygiene and do not share personal care items.   Do not share athletic equipment, towels, or whirlpools. Shower after every practice or work out session.   If a draining area cannot be covered:   Do not play sports.   Children should not go to daycare until the wound has healed or until fluid (drainage) stops coming out of the wound.   See your doctor for a follow-up visit as told.  GET HELP RIGHT AWAY IF:   There is more pain, puffiness (swelling), and redness in the wound site.   There is fluid or bleeding from the wound site.   You have muscle aches, chills, fever, or feel sick.   You or your child has a temperature by mouth above 102 F (38.9 C), not controlled by medicine.   Your baby is older than 3 months with a rectal temperature of 102 F (38.9 C) or higher.  MAKE SURE YOU:   Understand these instructions.   Will watch your condition.   Will get help right away if you are not doing well or get worse.  Document Released: 09/04/2007 Document Revised: 03/07/2011 Document Reviewed: 09/04/2007 [facility] Patient Information 2012 [place], [place].    Take the antibiotic as directed.  apply warm compresses several times daily.  Return as needed.

## 2011-09-12 NOTE — ED Provider Notes (Signed)
History     CSN: [id number]  Arrival date & time 09/12/11  1958   None     Chief Complaint  Patient presents with  . Abscess    (Consider location/radiation/quality/duration/timing/severity/associated sxs/prior treatment) HPI Comments: Suprapubic area.  Started 4 days ago.  No fever or chills.  Patient is a 46 y.o. female presenting with abscess. The history is provided by the patient. No language interpreter was used.  Abscess  This is a new problem. Pertinent negatives include no fever.    Past Medical History  Diagnosis Date  . Seizures   . Stroke   . Asthma   . Hyperlipidemia     Past Surgical History  Procedure Date  . Partial hyster   . Cesarean section     No family history on file.  History  Substance Use Topics  . Smoking status: Never Smoker   . Smokeless tobacco: Not on file  . Alcohol Use: Yes    OB History    Grav Para Term Preterm Abortions TAB SAB Ect Mult Living                  Review of Systems  Constitutional: Negative for fever and chills.  Skin:       abscess    Allergies  Review of patient's allergies indicates no known allergies.  Home Medications   Current Outpatient Rx  Name Route Sig Dispense Refill  . BUDESONIDE-FORMOTEROL FUMARATE 80-4.5 MCG/ACT IN AERO Inhalation Inhale 2 puffs into the lungs every 12 (twelve) hours as needed.    [name] DIVALPROEX SODIUM 250 MG PO TBEC Oral Take 250 mg by mouth 3 (three) times daily.    [name] LEVALBUTEROL TARTRATE 45 MCG/ACT IN AERO Inhalation Inhale 1-2 puffs into the lungs every 4 (four) hours as needed.    [name] PRAVASTATIN SODIUM 20 MG PO TABS Oral Take 20 mg by mouth daily.    . TETRAHYDROZOLINE HCL 0.05 % OP SOLN Both Eyes Place 1 drop into both eyes daily as needed.    [name] DOXYCYCLINE HYCLATE 100 MG PO CAPS Oral Take 1 capsule (100 mg total) by mouth 2 (two) times daily. 20 capsule 0    BP 128/74  Pulse 87  Temp 98.1 F (36.7 C) (Oral)  Resp 16  Ht 5\' 2"  (1.575 m)  Wt 215 lb (97.523  kg)  BMI 39.32 kg/m2  SpO2 99%  Physical Exam  Nursing note and vitals reviewed. Constitutional: She is oriented to person, place, and time. She appears well-developed and well-nourished. No distress.  HENT:  Head: Normocephalic and atraumatic.  Eyes: EOM are normal.  Neck: Normal range of motion.  Cardiovascular: Normal rate, regular rhythm and normal heart sounds.   Pulmonary/Chest: Effort normal and breath sounds normal.  Abdominal: Soft. She exhibits no distension. There is no tenderness.  Musculoskeletal: Normal range of motion.       Legs: Neurological: She is alert and oriented to person, place, and time.  Skin: Skin is warm and dry.  Psychiatric: She has a normal mood and affect. Judgment normal.    ED Course  Procedures (including critical care time)  Labs Reviewed - No data to display No results found.   1. Abscess       MDM  rx-doxycycline 100 mg, BID, 20 Warm compresses.        Worthy Rancher, PA 09/12/11 2157

## 2011-09-12 NOTE — ED Notes (Signed)
States she hash boil in private area

## 2011-09-13 NOTE — ED Provider Notes (Signed)
Medical screening examination/treatment/procedure(s) were performed by non-physician practitioner and as supervising physician I was immediately available for consultation/collaboration.   Laray Anger, DO 09/13/11 1109

## 2011-10-17 ENCOUNTER — Other Ambulatory Visit (HOSPITAL_COMMUNITY)
Admission: RE | Admit: 2011-10-17 | Discharge: 2011-10-17 | Disposition: A | Discharge status: Home / Self Care | Payer: Medicare Other | Source: Ambulatory Visit | Attending: Family Medicine | Admitting: Family Medicine

## 2011-10-17 ENCOUNTER — Other Ambulatory Visit: Payer: Self-pay | Admitting: Family Medicine

## 2011-10-17 DIAGNOSIS — Z01419 Encounter for gynecological examination (general) (routine) without abnormal findings: Secondary | ICD-10-CM | POA: Diagnosis not present

## 2011-10-17 DIAGNOSIS — Z1151 Encounter for screening for human papillomavirus (HPV): Secondary | ICD-10-CM | POA: Insufficient documentation

## 2011-10-17 DIAGNOSIS — Z125 Encounter for screening for malignant neoplasm of prostate: Secondary | ICD-10-CM | POA: Diagnosis not present

## 2011-10-17 DIAGNOSIS — Z124 Encounter for screening for malignant neoplasm of cervix: Secondary | ICD-10-CM | POA: Insufficient documentation

## 2011-10-17 DIAGNOSIS — E785 Hyperlipidemia, unspecified: Secondary | ICD-10-CM | POA: Diagnosis not present

## 2011-10-17 DIAGNOSIS — R561 Post traumatic seizures: Secondary | ICD-10-CM | POA: Diagnosis not present

## 2011-10-17 LAB — BASIC METABOLIC PANEL
ALT: 12 U/L (ref 7–35)
Albumin: 4.2
BUN: 6 mg/dL (ref 4–21)
Glucose: 85
Potassium: 3.9 mmol/L
Total Bilirubin: 0.3 mg/dL

## 2011-10-17 LAB — CBC WITH DIFFERENTIAL/PLATELET
MCV: 92 fL
WBC: 7.2
platelet count: 266

## 2011-11-12 ENCOUNTER — Encounter: Payer: Self-pay | Admitting: General Surgery

## 2011-11-13 ENCOUNTER — Ambulatory Visit (INDEPENDENT_AMBULATORY_CARE_PROVIDER_SITE_OTHER): Payer: Medicare Other | Admitting: Gastroenterology

## 2011-11-13 ENCOUNTER — Encounter: Payer: Self-pay | Admitting: Gastroenterology

## 2011-11-13 ENCOUNTER — Other Ambulatory Visit: Payer: Self-pay | Admitting: Gastroenterology

## 2011-11-13 VITALS — BP 128/87 | HR 72 | Temp 98.4°F | Ht 62.0 in | Wt 207.2 lb

## 2011-11-13 DIAGNOSIS — K625 Hemorrhage of anus and rectum: Secondary | ICD-10-CM | POA: Insufficient documentation

## 2011-11-13 DIAGNOSIS — R109 Unspecified abdominal pain: Secondary | ICD-10-CM | POA: Diagnosis not present

## 2011-11-13 HISTORY — DX: Hemorrhage of anus and rectum: K62.5

## 2011-11-13 MED ORDER — PEG 3350-KCL-NA BICARB-NACL 420 G PO SOLR: 4000.0000 L | ORAL | Status: AC

## 2011-11-13 NOTE — Patient Instructions (Addendum)
We have scheduled you for a colonoscopy with Dr. Darrick Penna. Please see separate instructions. Take ibuprofen 600mg  three times a day for your side/back pain for 10-14 days. If symptoms persist, I would recommend you discuss further with Dr. Loleta Chance.

## 2011-11-13 NOTE — Progress Notes (Signed)
Faxed to PCP

## 2011-11-13 NOTE — Assessment & Plan Note (Signed)
Intermittent left flank pain, positional. Likely musculoskeletal. Ibuprofen 600mg  tid for 2 weeks. For persistent pain, discuss with Dr. Loleta Chance. She may need CT abd for further evaluation, if TCS negative. I will also discuss with Dr. Darrick Penna.

## 2011-11-13 NOTE — Progress Notes (Signed)
Primary Care Physician:  Evlyn Courier, MD  Primary Gastroenterologist:  Jonette Eva, MD   Chief Complaint  Patient presents with  . Rectal Bleeding    HPI:  Patty [name] is a 46 y.o. female here at the request of Dr. Mirna Mires. Patient not sure of reason for today's visit. Referral papers said for heme positive stool but heme negative stool documented under PE on referral papers.   Patient denies prior colonoscopy. No FH of colon cancer. She has h/o intermittent brbpr with wiping. No constipation, diarrhea. Left flank pain off/on. Positional. Unrelated to meals or BMs. Takes ibuprofen with it. No heartburn, vomiting, dysphagia, weight loss.   MRI lumbar spine 05/2011-->small foraminal protrusion on right with mild foraminal narrowing. Small left paracentral protrusion L5-S1 without central canal or foraminal narrowing.   Current Outpatient Prescriptions  Medication Sig Dispense Refill  . aspirin 81 MG tablet Take 81 mg by mouth daily.      . budesonide-formoterol (SYMBICORT) 80-4.5 MCG/ACT inhaler Inhale 2 puffs into the lungs every 12 (twelve) hours as needed.      . divalproex (DEPAKOTE) 250 MG DR tablet Take 250 mg by mouth 3 (three) times daily.      [name] levalbuterol (XOPENEX HFA) 45 MCG/ACT inhaler Inhale 1-2 puffs into the lungs every 4 (four) hours as needed.      . pravastatin (PRAVACHOL) 20 MG tablet Take 20 mg by mouth daily.      [name] tetrahydrozoline (EYE DROPS) 0.05 % ophthalmic solution Place 1 drop into both eyes daily as needed.      . polyethylene glycol-electrolytes (TRILYTE) 420 G solution Take 4,000,000 mLs by mouth as directed.  4000 mL  0    Allergies as of 11/13/2011  . (No Known Allergies)    Past Medical History  Diagnosis Date  . Seizures     remote  . Stroke 1990    chronic right hemiparesis  . Asthma   . Hyperlipidemia   . HTN (hypertension)   . Depression     Past Surgical History  Procedure Date  . Partial hysterectomy   . Cesarean section      Family History  Problem Relation Age of Onset  . Colon cancer Neg Hx   . Liver disease Neg Hx     History   Social History  . Marital Status: Divorced    Spouse Name: N/A    Number of Children: 2  . Years of Education: N/A   Occupational History  . disabled    Social History Main Topics  . Smoking status: Never Smoker   . Smokeless tobacco: Not on file  . Alcohol Use: Yes     couple of drinks on weekends  . Drug Use: No  . Sexually Active: Not Currently   Other Topics Concern  . Not on file   Social History Narrative  . No narrative on file      ROS:  General: Negative for anorexia, weight loss, fever, chills, fatigue, weakness. Eyes: Negative for vision changes.  ENT: Negative for hoarseness, difficulty swallowing , nasal congestion. CV: Negative for chest pain, angina, palpitations, dyspnea on exertion, peripheral edema.  Respiratory: Negative for dyspnea at rest, dyspnea on exertion, cough, sputum, wheezing.  GI: See history of present illness. GU:  Negative for dysuria, hematuria, urinary incontinence, urinary frequency, nocturnal urination.  MS: Negative for joint pain. intermittent low back pain.  Derm: Negative for rash or itching.  Neuro: Chronic right hemiparesis. H/O seizures but remote.  Negative for frequent headaches, memory loss, confusion.  Psych: Negative for anxiety, depression, suicidal ideation, hallucinations.  Endo: Negative for unusual weight change.  Heme: Negative for bruising or bleeding. Allergy: Negative for rash or hives.    Physical Examination:  BP 128/87  Pulse 72  Temp 98.4 F (36.9 C) (Temporal)  Ht 5\' 2"  (1.575 m)  Wt 207 lb 3.2 oz (93.985 kg)  BMI 37.90 kg/m2   General: Well-nourished, well-developed in no acute distress.  Head: Normocephalic, atraumatic.   Eyes: Conjunctiva pink, no icterus. Mouth: Oropharyngeal mucosa moist and pink , no lesions erythema or exudate. Neck: Supple without thyromegaly, masses, or  lymphadenopathy.  Lungs: Clear to auscultation bilaterally.  Heart: Regular rate and rhythm, no murmurs rubs or gallops.  Abdomen: Bowel sounds are normal, left flank tenderness with palpation. No CVA tenderness or back tenderness, nondistended, no hepatosplenomegaly or masses, no abdominal bruits or hernia , no rebound or guarding.   Rectal: defer Extremities: No lower extremity edema. No clubbing or deformities.  Neuro: Alert and oriented x 4 , grossly noted right sided weakness with ambulation.  Skin: Warm and dry, no rash or jaundice.   Psych: Alert and cooperative, normal mood and affect.  Labs: Labs from 10/17/2011: White blood cell count 7200, hemoglobin 13.4, hematocrit 39.2, MCV 92, platelets 266,000, sodium 138, potassium 3.9, glucose 85, BUN 6, creatinine 0.78, calcium 9.5 total bilirubin 0.3, alkaline phosphatase 77, AST 12, ALT 12, albumin 4.2.  Imaging Studies: No results found.

## 2011-11-13 NOTE — Progress Notes (Signed)
PROCEED WITH TCS. IF L FLANK PAIN CHANGES WITH POSITION, MOST LIKELY MS IN ORIGIN. AGREE WITH TRIAL OF IBUPROFEN. TCS 9/9.

## 2011-11-13 NOTE — Assessment & Plan Note (Signed)
Intermittent toilet tissue hematochezia. Likely benign anorectal source. Patient with ?heme positive stool, see above for details. No prior colonoscopy. Colonoscopy in near future with Dr. Darrick Penna.  I have discussed the risks, alternatives, benefits with regards to but not limited to the risk of reaction to medication, bleeding, infection, perforation and the patient is agreeable to proceed. Written consent to be obtained.

## 2011-11-25 ENCOUNTER — Encounter (HOSPITAL_COMMUNITY): Payer: Self-pay | Admitting: Pharmacy Technician

## 2011-12-09 ENCOUNTER — Other Ambulatory Visit: Payer: Self-pay | Admitting: Gastroenterology

## 2011-12-09 ENCOUNTER — Telehealth: Payer: Self-pay | Admitting: Gastroenterology

## 2011-12-09 MED ORDER — PEG 3350-KCL-NA BICARB-NACL 420 G PO SOLR: 4000.0000 mL | ORAL | Status: AC

## 2011-12-09 NOTE — Telephone Encounter (Signed)
Patient has been rescheduled for 12/24/11 at 12:30 new instruction sheet has been mailed to the patient

## 2011-12-09 NOTE — Telephone Encounter (Signed)
Pt called this morning to say she needed to cancel her procedure today with [facility]. Patient ate solid food after taking her prep. She wants to [facility] to next available. Please call her back at [phone]

## 2011-12-24 ENCOUNTER — Encounter (HOSPITAL_COMMUNITY)
Admission: RE | Disposition: A | Discharge status: Home / Self Care | Payer: Self-pay | Source: Ambulatory Visit | Attending: Gastroenterology

## 2011-12-24 ENCOUNTER — Ambulatory Visit (HOSPITAL_COMMUNITY)
Admission: RE | Admit: 2011-12-24 | Discharge: 2011-12-24 | Disposition: A | Discharge status: Home / Self Care | Payer: Medicare Other | Source: Ambulatory Visit | Attending: Gastroenterology | Admitting: Gastroenterology

## 2011-12-24 ENCOUNTER — Encounter (HOSPITAL_COMMUNITY): Payer: Self-pay | Admitting: *Deleted

## 2011-12-24 DIAGNOSIS — K5730 Diverticulosis of large intestine without perforation or abscess without bleeding: Secondary | ICD-10-CM | POA: Insufficient documentation

## 2011-12-24 DIAGNOSIS — E785 Hyperlipidemia, unspecified: Secondary | ICD-10-CM | POA: Diagnosis not present

## 2011-12-24 DIAGNOSIS — K648 Other hemorrhoids: Secondary | ICD-10-CM | POA: Insufficient documentation

## 2011-12-24 DIAGNOSIS — I10 Essential (primary) hypertension: Secondary | ICD-10-CM | POA: Diagnosis not present

## 2011-12-24 DIAGNOSIS — K625 Hemorrhage of anus and rectum: Secondary | ICD-10-CM | POA: Diagnosis not present

## 2011-12-24 HISTORY — PX: COLONOSCOPY: SHX5424

## 2011-12-24 SURGERY — COLONOSCOPY
Anesthesia: Moderate Sedation

## 2011-12-24 MED ORDER — MIDAZOLAM HCL 5 MG/5ML IJ SOLN
INTRAMUSCULAR | Status: DC | PRN
  Administered 2011-12-24 (×2): 2 mg via INTRAVENOUS
  Administered 2011-12-24: 1 mg via INTRAVENOUS

## 2011-12-24 MED ORDER — MIDAZOLAM HCL 5 MG/5ML IJ SOLN
INTRAMUSCULAR | Status: AC
  Filled 2011-12-24: qty 10

## 2011-12-24 MED ORDER — STERILE WATER FOR IRRIGATION IR SOLN
Status: DC | PRN
  Administered 2011-12-24: 11:00:00

## 2011-12-24 MED ORDER — MEPERIDINE HCL 100 MG/ML IJ SOLN
INTRAMUSCULAR | Status: AC
  Filled 2011-12-24: qty 1

## 2011-12-24 MED ORDER — MEPERIDINE HCL 100 MG/ML IJ SOLN
INTRAMUSCULAR | Status: DC | PRN
  Administered 2011-12-24 (×2): 25 mg via INTRAVENOUS
  Administered 2011-12-24: 50 mg via INTRAVENOUS

## 2011-12-24 MED ORDER — SODIUM CHLORIDE 0.45 % IV SOLN
INTRAVENOUS | Status: DC
  Administered 2011-12-24: 11:00:00 via INTRAVENOUS

## 2011-12-24 NOTE — H&P (Signed)
  Primary Care Physician:  Evlyn Courier, MD Primary Gastroenterologist:  Dr. Darrick Penna  Pre-Procedure History & Physical: HPI:  Patty [name] is a 45 y.o. female here for  BRBPR.  Past Medical History  Diagnosis Date  . Stroke 1990    chronic right hemiparesis  . Asthma   . Hyperlipidemia   . HTN (hypertension)   . Depression   . Seizures     remote    Past Surgical History  Procedure Date  . Partial hysterectomy   . Cesarean section     Prior to Admission medications   Medication Sig Start Date End Date Taking? Authorizing Provider  aspirin 81 MG tablet Take 81 mg by mouth daily.   Yes Historical Provider, MD  budesonide-formoterol (SYMBICORT) 80-4.5 MCG/ACT inhaler Inhale 2 puffs into the lungs every 12 (twelve) hours as needed. Shortness of breath   Yes Historical Provider, MD  divalproex (DEPAKOTE) 250 MG DR tablet Take 250 mg by mouth 3 (three) times daily.   Yes Historical Provider, MD  ibuprofen (ADVIL,MOTRIN) 200 MG tablet Take 400 mg by mouth every 6 (six) hours as needed. Pain   Yes Historical Provider, MD  levalbuterol (XOPENEX HFA) 45 MCG/ACT inhaler Inhale 1-2 puffs into the lungs every 4 (four) hours as needed. Shortness of breath   Yes Historical Provider, MD  pravastatin (PRAVACHOL) 20 MG tablet Take 20 mg by mouth daily.   Yes Historical Provider, MD  tetrahydrozoline (EYE DROPS) 0.05 % ophthalmic solution Place 1 drop into both eyes daily as needed. Dry Eyes   Yes Historical Provider, MD    Allergies as of 11/13/2011  . (No Known Allergies)    Family History  Problem Relation Age of Onset  . Colon cancer Neg Hx   . Liver disease Neg Hx     History   Social History  . Marital Status: Divorced    Spouse Name: N/A    Number of Children: 2  . Years of Education: N/A   Occupational History  . disabled    Social History Main Topics  . Smoking status: Never Smoker   . Smokeless tobacco: Not on file  . Alcohol Use: Yes     couple of drinks on  weekends  . Drug Use: No  . Sexually Active: Not Currently   Other Topics Concern  . Not on file   Social History Narrative  . No narrative on file    Review of Systems: See HPI, otherwise negative ROS   Physical Exam: BP 153/95  Pulse 85  Temp 98.2 F (36.8 C) (Oral)  Resp 22  Ht 5\' 2"  (1.575 m)  Wt 207 lb (93.895 kg)  BMI 37.86 kg/m2 General:   Alert,  pleasant and cooperative in NAD Head:  Normocephalic and atraumatic. Neck:  Supple; Lungs:  Clear throughout to auscultation.    Heart:  Regular rate and rhythm. Abdomen:  Soft, nontender and nondistended. Normal bowel sounds, without guarding, and without rebound.   Neurologic:  Alert and  oriented x4;  grossly normal neurologically.  Impression/Plan:    BRBPR  PLAN: TCS TODAY

## 2011-12-24 NOTE — Op Note (Signed)
[facility] [address] [place], [address]   COLONOSCOPY PROCEDURE REPORT  PATIENT: Patty, [name]  MR#: [id number] BIRTHDATE: 07/05/1965 , 46  yrs. old GENDER: Female ENDOSCOPIST: Jonette Eva, MD REFERRED XB:JYNWGN Hill, M.D. PROCEDURE DATE:  12/24/2011 PROCEDURE:   ILEOColonoscopy, screening INDICATIONS:rectal bleeding. MEDICATIONS: Demerol 100 mg IV and Versed 5 mg IV  DESCRIPTION OF PROCEDURE:    Physical exam was performed.  Informed consent was obtained from the patient after explaining the benefits, risks, and alternatives to procedure.  The patient was connected to monitor and placed in left lateral position. Continuous oxygen was provided by nasal cannula and IV medicine administered through an indwelling cannula.  After administration of sedation and rectal exam, the patients rectum was intubated and the EC-[id number] ([id number])  colonoscope was advanced under direct visualization to the ileum.  The scope was removed slowly by carefully examining the color, texture, anatomy, and integrity mucosa on the way out.  The patient was recovered in endoscopy and discharged home in satisfactory condition.       COLON FINDINGS: The mucosa appeared normal in the terminal ileum.  10-20 CM VISUALIZED , Mild diverticulosis was noted in the descending colon and sigmoid colon.  , The colon was otherwise normal.  There was no inflammation, polyps or cancers unless previously stated.  , and Small internal hemorrhoids were found.   PREP QUALITY: excellent. CECAL W/D TIME: 11 minutes  COMPLICATIONS: None  ENDOSCOPIC IMPRESSION: 1.   Normal mucosa in the terminal ileum 2.   Mild diverticulosis was noted in the descending colon and sigmoid colon 3.   The colon was otherwise normal 4.   Small internal hemorrhoids   RECOMMENDATIONS: 1.  Follow a HIGH FIBER DIET.  PREP H OR PROCTOCREAM FOR RECTAL BLEEDING.  CALL OFFICE FOR RX OR QUESTIONS.  TCS IN 10  YEARS. 2.  Follow a HIGH FIBER DIET.  PREP H OR PROCTOCREAM FOR RECTAL BLEEDING.  CALL OFFICE FOR RX OR QUESTIONS.  TCS IN 10 YEARS.       _______________________________ Rosalie DoctorJonette Eva, MD 12/24/2011 1:19 PM     PATIENT NAME:  Patty, [name] MR#: [id number]

## 2011-12-26 ENCOUNTER — Encounter (HOSPITAL_COMMUNITY): Payer: Self-pay | Admitting: Gastroenterology

## 2012-01-12 NOTE — Progress Notes (Signed)
TCS SEP 2013 TICS/IH  REVIEWED.

## 2012-01-29 ENCOUNTER — Other Ambulatory Visit (HOSPITAL_COMMUNITY): Payer: Self-pay | Admitting: Family Medicine

## 2012-01-29 DIAGNOSIS — Z139 Encounter for screening, unspecified: Secondary | ICD-10-CM

## 2012-01-31 ENCOUNTER — Ambulatory Visit (HOSPITAL_COMMUNITY)
Admission: RE | Admit: 2012-01-31 | Discharge: 2012-01-31 | Disposition: A | Discharge status: Home / Self Care | Payer: Medicare Other | Source: Ambulatory Visit | Attending: Family Medicine | Admitting: Family Medicine

## 2012-01-31 DIAGNOSIS — Z1231 Encounter for screening mammogram for malignant neoplasm of breast: Secondary | ICD-10-CM | POA: Insufficient documentation

## 2012-01-31 DIAGNOSIS — Z139 Encounter for screening, unspecified: Secondary | ICD-10-CM

## 2012-02-14 DIAGNOSIS — G40909 Epilepsy, unspecified, not intractable, without status epilepticus: Secondary | ICD-10-CM | POA: Diagnosis not present

## 2012-02-14 DIAGNOSIS — Z23 Encounter for immunization: Secondary | ICD-10-CM | POA: Diagnosis not present

## 2012-02-14 DIAGNOSIS — J45909 Unspecified asthma, uncomplicated: Secondary | ICD-10-CM | POA: Diagnosis not present

## 2012-02-14 DIAGNOSIS — R561 Post traumatic seizures: Secondary | ICD-10-CM | POA: Diagnosis not present

## 2012-02-14 DIAGNOSIS — I10 Essential (primary) hypertension: Secondary | ICD-10-CM | POA: Diagnosis not present

## 2012-05-05 DIAGNOSIS — N6459 Other signs and symptoms in breast: Secondary | ICD-10-CM | POA: Diagnosis not present

## 2012-09-07 DIAGNOSIS — E781 Pure hyperglyceridemia: Secondary | ICD-10-CM | POA: Diagnosis not present

## 2012-09-07 DIAGNOSIS — R561 Post traumatic seizures: Secondary | ICD-10-CM | POA: Diagnosis not present

## 2013-03-03 DIAGNOSIS — R561 Post traumatic seizures: Secondary | ICD-10-CM | POA: Diagnosis not present

## 2013-03-03 DIAGNOSIS — J45909 Unspecified asthma, uncomplicated: Secondary | ICD-10-CM | POA: Diagnosis not present

## 2013-03-03 DIAGNOSIS — E785 Hyperlipidemia, unspecified: Secondary | ICD-10-CM | POA: Diagnosis not present

## 2013-04-19 ENCOUNTER — Other Ambulatory Visit (HOSPITAL_COMMUNITY): Payer: Self-pay | Admitting: Family Medicine

## 2013-04-19 DIAGNOSIS — Z139 Encounter for screening, unspecified: Secondary | ICD-10-CM

## 2013-04-20 ENCOUNTER — Ambulatory Visit (HOSPITAL_COMMUNITY)
Admission: RE | Admit: 2013-04-20 | Discharge: 2013-04-20 | Disposition: A | Discharge status: Home / Self Care | Payer: Medicare Other | Source: Ambulatory Visit | Attending: Family Medicine | Admitting: Family Medicine

## 2013-04-20 DIAGNOSIS — Z139 Encounter for screening, unspecified: Secondary | ICD-10-CM

## 2013-04-20 DIAGNOSIS — Z1231 Encounter for screening mammogram for malignant neoplasm of breast: Secondary | ICD-10-CM | POA: Insufficient documentation

## 2013-04-29 ENCOUNTER — Encounter (HOSPITAL_COMMUNITY): Payer: Self-pay | Admitting: Emergency Medicine

## 2013-04-29 ENCOUNTER — Other Ambulatory Visit: Payer: Self-pay

## 2013-04-29 ENCOUNTER — Emergency Department (HOSPITAL_COMMUNITY)
Admission: EM | Admit: 2013-04-29 | Discharge: 2013-04-29 | Disposition: A | Discharge status: Home / Self Care | Payer: Medicare Other | Attending: Emergency Medicine | Admitting: Emergency Medicine

## 2013-04-29 ENCOUNTER — Emergency Department (HOSPITAL_COMMUNITY): Payer: Medicare Other

## 2013-04-29 DIAGNOSIS — Z79899 Other long term (current) drug therapy: Secondary | ICD-10-CM | POA: Diagnosis not present

## 2013-04-29 DIAGNOSIS — J45909 Unspecified asthma, uncomplicated: Secondary | ICD-10-CM | POA: Diagnosis not present

## 2013-04-29 DIAGNOSIS — N644 Mastodynia: Secondary | ICD-10-CM | POA: Diagnosis not present

## 2013-04-29 DIAGNOSIS — Z7982 Long term (current) use of aspirin: Secondary | ICD-10-CM | POA: Diagnosis not present

## 2013-04-29 DIAGNOSIS — F3289 Other specified depressive episodes: Secondary | ICD-10-CM | POA: Insufficient documentation

## 2013-04-29 DIAGNOSIS — Z8673 Personal history of transient ischemic attack (TIA), and cerebral infarction without residual deficits: Secondary | ICD-10-CM | POA: Insufficient documentation

## 2013-04-29 DIAGNOSIS — E785 Hyperlipidemia, unspecified: Secondary | ICD-10-CM | POA: Insufficient documentation

## 2013-04-29 DIAGNOSIS — F329 Major depressive disorder, single episode, unspecified: Secondary | ICD-10-CM | POA: Diagnosis not present

## 2013-04-29 DIAGNOSIS — Z8739 Personal history of other diseases of the musculoskeletal system and connective tissue: Secondary | ICD-10-CM | POA: Insufficient documentation

## 2013-04-29 DIAGNOSIS — I10 Essential (primary) hypertension: Secondary | ICD-10-CM | POA: Diagnosis not present

## 2013-04-29 DIAGNOSIS — G40909 Epilepsy, unspecified, not intractable, without status epilepticus: Secondary | ICD-10-CM | POA: Insufficient documentation

## 2013-04-29 DIAGNOSIS — R079 Chest pain, unspecified: Secondary | ICD-10-CM | POA: Diagnosis not present

## 2013-04-29 HISTORY — DX: Other intervertebral disc degeneration, lumbar region: M51.36

## 2013-04-29 HISTORY — DX: Other intervertebral disc degeneration, lumbar region without mention of lumbar back pain or lower extremity pain: M51.369

## 2013-04-29 LAB — CBC WITH DIFFERENTIAL/PLATELET
BASOS ABS: 0 10*3/uL (ref 0.0–0.1)
Basophils Relative: 1 % (ref 0–1)
Eosinophils Absolute: 0.3 10*3/uL (ref 0.0–0.7)
Eosinophils Relative: 4 % (ref 0–5)
HEMATOCRIT: 40.7 % (ref 36.0–46.0)
Hemoglobin: 13.4 g/dL (ref 12.0–15.0)
LYMPHS ABS: 2.7 10*3/uL (ref 0.7–4.0)
LYMPHS PCT: 41 % (ref 12–46)
MCH: 31 pg (ref 26.0–34.0)
MCHC: 32.9 g/dL (ref 30.0–36.0)
MCV: 94.2 fL (ref 78.0–100.0)
MONO ABS: 0.4 10*3/uL (ref 0.1–1.0)
Monocytes Relative: 6 % (ref 3–12)
NEUTROS ABS: 3.2 10*3/uL (ref 1.7–7.7)
Neutrophils Relative %: 48 % (ref 43–77)
Platelets: 265 10*3/uL (ref 150–400)
RBC: 4.32 MIL/uL (ref 3.87–5.11)
RDW: 12.7 % (ref 11.5–15.5)
WBC: 6.6 10*3/uL (ref 4.0–10.5)

## 2013-04-29 LAB — COMPREHENSIVE METABOLIC PANEL
ALT: 10 U/L (ref 0–35)
AST: 12 U/L (ref 0–37)
Albumin: 3.4 g/dL — ABNORMAL LOW (ref 3.5–5.2)
Alkaline Phosphatase: 89 U/L (ref 39–117)
BUN: 16 mg/dL (ref 6–23)
CHLORIDE: 102 meq/L (ref 96–112)
CO2: 26 meq/L (ref 19–32)
Calcium: 9 mg/dL (ref 8.4–10.5)
Creatinine, Ser: 0.81 mg/dL (ref 0.50–1.10)
GFR, EST NON AFRICAN AMERICAN: 85 mL/min — AB (ref >90)
GLUCOSE: 82 mg/dL (ref 70–99)
Potassium: 3.7 mEq/L (ref 3.7–5.3)
SODIUM: 140 meq/L (ref 137–147)
Total Protein: 8 g/dL (ref 6.0–8.3)

## 2013-04-29 LAB — TROPONIN I: Troponin I: <0.3 ng/mL (ref <0.30)

## 2013-04-29 MED ORDER — NAPROXEN 250 MG PO TABS: 250.0000 mg | ORAL_TABLET | Freq: Two times a day (BID) | ORAL | Status: DC

## 2013-04-29 MED ORDER — TRAMADOL HCL 50 MG PO TABS: 50.0000 mg | ORAL_TABLET | Freq: Four times a day (QID) | ORAL | Status: DC | PRN

## 2013-04-29 NOTE — Discharge Instructions (Signed)
°Emergency Department Resource Guide °1) Find a Doctor and Pay Out of Pocket °Although you won't have to find out who is covered by your insurance plan, it is a good idea to ask around and get recommendations. You will then need to call the office and see if the doctor you have chosen will accept you as a new patient and what types of options they offer for patients who are self-pay. Some doctors offer discounts or will set up payment plans for their patients who do not have insurance, but you will need to ask so you aren't surprised when you get to your appointment. ° °2) Contact Your Local Health Department °Not all health departments have doctors that can see patients for sick visits, but many do, so it is worth a call to see if yours does. If you don't know where your local health department is, you can check in your phone book. The CDC also has a tool to help you locate your state's health department, and many state websites also have listings of all of their local health departments. ° °3) Find a Walk-in Clinic °If your illness is not likely to be very severe or complicated, you may want to try a walk in clinic. These are popping up all over the country in pharmacies, drugstores, and shopping centers. They're usually staffed by nurse practitioners or physician assistants that have been trained to treat common illnesses and complaints. They're usually fairly quick and inexpensive. However, if you have serious medical issues or chronic medical problems, these are probably not your best option. ° °No Primary Care Doctor: °- Call [facility] at  832-8000 - they can help you locate a primary care doctor that  accepts your insurance, provides certain services, etc. °- Physician Referral Service- 1-800-533-3463 ° °Chronic Pain Problems: °Organization         Address  Phone   Notes  °[facility]  (336) 297-2271 Patients need to be referred by their primary care doctor.  ° °Medication  Assistance: °Organization         Address  Phone   Notes  °[place] [facility] [address] °[place], [place] [address] (336) 641-8030 --Must be a resident of [place] °-- Must have NO insurance coverage whatsoever (no Medicaid/ Medicare, etc.) °-- The pt. MUST have a primary care doctor that directs their care regularly and follows them in the community °  °[facility]  (866) 331-1348   °[employer]  (888) 892-1162   ° °Agencies that provide inexpensive medical care: °Organization         Address  Phone   Notes  °[facility]  (336) 832-8035   °[facility]    (336) 832-7272   °[facility] [address] °[place], [place] [address] (336) 832-4777   °[facility] [address], °[place] (336) 271-4999   °[facility]    (336) 373-0678   °[facility]    (336) 272-1050   °[facility] ° [address], [place] Phone:  (336) 832-4444, Fax:  (336) 832-4440 Hours of Operation:  9 am - 6 pm, M-F.  Also accepts Medicaid/Medicare and self-pay.  °[facility] ° [address], [place] Phone: (336) [phone], Fax: (336) [phone]. Hours of Operation:  8:30 am - 5:30 pm, M-F.  Also accepts Medicaid and self-pay.  °[facility] [place] [address], [place] Phone: (336) 878-6027   °[facility] [address], [place], [place] (336)723-1848, Ext. 123 Mondays & Thursdays: 7-9 AM.  First 15 patients are seen on a first come, first serve basis. °  ° °Medicaid-accepting [place] Providers: ° °Organization         Address  Phone   Notes  °[facility] [name], [place] (336) 641-2100 Also accepts self-pay patients.  °[facility] [address], [place] ° (336) 856-9996   °[facility] [address], [place]  (336) 288-8857   °[facility] [address] [place] [address], [place] (336) 299-7000   °Veita Bland [address], [place]  ° (336) 373-1557 Only accepts [place] Access Medicaid patients after they have their name applied to their card.  ° °Self-Pay (no insurance) in [place]: ° °Organization         Address  Phone   Notes  °Sickle Cell Patients, [facility] [address], [place] (336) 832-1970   °[facility] [address], [place] (336) 832-4400   °[facility] ° [address], [place] (336) 992-4800   °[facility]/Dr. Osei-Bonsu ° [address] [place] [address], [place] or [address], [place] (336) [phone] Phone number for both [place] and [place] locations is the same.  °[facility] [address], [place] (336) 299-0000   °[facility] [address] [place] [address], [place] or [address] (336) 852-7530 °(336) 878-2260   °[facility] [address], [place] (336) 350-1642, phone; (336) 294-5005, fax Sees patients 1st and 3rd Saturday of every month.  Must not qualify for public or private insurance (i.e. Medicaid, Medicare, [place] Health Choice, Veterans' Benefits) • Household income should be no more than 200% of the poverty level •The clinic cannot treat you if you are pregnant or think you are pregnant • Sexually transmitted diseases are not treated at the clinic.  ° ° °Dental Care: °Organization         Address  Phone  Notes  °[place] [facility] [address], [place] (336) 641-6152 Accepts children up to age 21 who are enrolled in Medicaid or [place] Health Choice; pregnant women with a Medicaid card; and children who have applied for Medicaid or [place] Health Choice, but were declined, whose parents can pay a reduced fee at time of service.  °[place]  [facility] [place]  [address], [place] (336) 641-7733 Accepts children up to age 21 who are enrolled in Medicaid or [place] Health Choice; pregnant women with a Medicaid card; and children who have applied for Medicaid or [place] Health Choice, but were declined, whose parents can pay a reduced fee at time of service.  °[facility] ° [address], [place] (336) 641-4533 Patients are seen by appointment only. Walk-ins are not accepted. [facility] will see patients 18 years of age and older. °Monday - Tuesday (8am-5pm) °Most Wednesdays (8:30-5pm) °$30 per visit, cash only  °[facility] ° [address], [place] (336) 641-4533 Patients are seen by appointment only. Walk-ins are not accepted. [facility] will see patients 18 years of age and older. °One   Wednesday Evening (Monthly: Volunteer Based).  $30 per visit, cash only  °[facility]  (919) 537-3737 for adults; Children under age 4, call Graduate Pediatric Dentistry at ([phone]) [phone]. Children aged 4-14, please call (919) [phone] to request a pediatric application. ° Dental services are provided in all areas of dental care including fillings, crowns and bridges, complete and partial dentures, implants, gum treatment, root canals, and extractions. Preventive care is also provided. Treatment is provided to both adults and children. °Patients are selected via a lottery and there is often a waiting list. °  °[facility] [address], °[place] ° (336) 763-8833 [email or web address] °  °[facility] [address], [place], [place] (336)723-1848, Ext. 123 Second and Fourth Thursday of each month, opens at 6:30 AM; Clinic ends at 9 AM.  Patients are seen on a first-come first-served basis, and a limited number are seen during each clinic.  ° °[facility] ° [address], [place], [place] (336) 723-7904    Eligibility Requirements °You must have lived in [place], [place], or [place] counties for at least the last three months. °  You cannot be eligible for state or federal sponsored healthcare insurance, including Veterans Administration, Medicaid, or Medicare. °  You generally cannot be eligible for healthcare insurance through your employer.  °  How to apply: °Eligibility screenings are held every Tuesday and Wednesday afternoon from 1:00 pm until 4:00 pm. You do not need an appointment for the interview!  °[facility] [address], [place], [place] 336-631-2330   °[facility]  336-342-8273   °[facility]  336-703-3100   °[facility]  336-570-6415   ° °Behavioral Health Resources in the Community: °Intensive Outpatient Programs °Organization         Address  Phone  Notes  °[facility] [address], [place], [place] 336-878-6098   °[facility] [address], [place], [place] 336-832-9800   °[facility] [address], [place], [place] ° 336-882-2125   °[facility] [address],  °[place], [place] 1-800-853-5163 or 336-641-4981   °Substance Abuse Resources °Organization         Address  Phone  Notes  °[facility]  336-882-2125   °[facility]  336-784-9470   °[facility]  336-285-9073   °[facility]  336-845-3988   °Residential & Outpatient Substance Abuse Program  1-800-659-3381   °Psychological Services °Organization         Address  Phone  Notes  °[facility]  336- 832-9600   °[facility]  336- 378-7881   °[facility] [address], [place] 1-800-853-5163 or 336-641-4981   ° °Mobile Crisis Teams °Organization         Address  Phone  Notes  °[facility], Mobile Crisis Care Unit  1-877-626-1772   °Assertive °[facility] ° [address]  [place], [place] 336-834-9664   °Sharon DeEsch [address] °[place] 336-554-5454   ° °Self-Help/Support Groups °Organization         Address  Phone             Notes  °[facility]  - variety of support groups  336- 373-1402 Call for more information  °Narcotics Anonymous (NA), [facility] [address], °[place]  2 meetings at this location  ° °  Residential Treatment Programs Organization         Address  Phone  Notes  [facility] [address],    [place]  [phone]   [facility]  [address], [place] [phone], [place], [place] [phone]   [facility] [address], [place] [phone] Admissions: 8am-3pm M-F  [facility] [address]    [place], [place] [phone]   [facility] [address], [place], [place] [phone]   [facility] [address]  [place], [place] [phone]   [facility] Intensive Outpatient [address], [place], [place] [phone]   [facility] ([facility] [address]  [place], [place] [phone] or [phone]   [facility] ([facility] [address] [place], [place] [phone] Accepts Medicaid  [facility] [address]  [place] [phone] Substance Abuse/Addiction Treatment   [facility]         Address  Phone  Notes  [facility]  [phone](888) [phone]   Angie FavaJulie Brannon, PhD [address] [place], [place]   [phone](336) [phone] or [phone](336) [phone]   [facility]   [address] [place], [place] [phone](336) [phone]   [facility] [address], [place], [place] [phone](336) [phone] Insurance/Medicaid/sponsorship through [facility]  [facility] [address]                                    [place], [place] [phone](336) [phone] Therapy/tele-psych/case    [facility] [address]   [place], [place] [phone]) [phone]    Dr. Lolly MustacheArfeen  [phone]) [phone]   [facility]  [facility] 1) [address], [place] 2) [address], [place] 3)  [address], [place] [phone](336) [phone](336) [phone]  [phone]) [phone]   [facility] Child Abuse Hotline (414) [phone](336) [phone] or [phone](336) [phone] (After Hours)       Take the prescriptions as directed.  Apply moist heat or ice to the area(s) of discomfort, for 15 minutes at a time, several times per day for the next few days.  Do not fall asleep on a heating or ice pack.  Call your regular medical doctor and your OB/GYN doctor tomorrow to schedule a follow up appointment within the next week.  Return to the Emergency Department immediately if worsening.

## 2013-04-29 NOTE — ED Notes (Signed)
Pain lt breast for 2 weeks,  Had normal mammogram last week.  No d/c, alert,  No redness

## 2013-04-29 NOTE — ED Notes (Signed)
Patient seen and placed for discharge prior to nursing assessment.  Assessment completed at discharge.  Patient c/o left breast pain; states no knots or discharge, just pain.

## 2013-04-29 NOTE — ED Notes (Signed)
Discharge instructions given and reviewed with patient.  Prescriptions given for Naproxen and Ultram; effects and use explained.  Patient verbalized understanding to follow up with PMD as needed.

## 2013-04-29 NOTE — ED Provider Notes (Signed)
CSN: [id number]     Arrival date & time 04/29/13  1556 History   First MD Initiated Contact with Patient 04/29/13 1941     Chief Complaint  Patient presents with  . Breast Pain    HPI Pt was seen at 1950. Per pt, c/o gradual onset and persistence of constant left breast "pain" for the past 2 to 3 weeks. Describes the pain as "sore," worsens with palpation of the area. States she had a mammogram last week "that was normal." Pt has not f/u with PMD or OB/GYN for this pain. Denies fevers, no rash, no reported injury, no CP/palpitations, no SOB/cough, no abd pain.    Past Medical History  Diagnosis Date  . Stroke 1990    chronic right hemiparesis  . Asthma   . Hyperlipidemia   . HTN (hypertension)   . Depression   . Seizures     remote  . DDD (degenerative disc disease), lumbar    Past Surgical History  Procedure Laterality Date  . Partial hysterectomy    . Cesarean section    . Colonoscopy  12/24/2011    Procedure: COLONOSCOPY;  Surgeon: West BaliSandi L Fields, MD;  Location: [facility];  Service: Endoscopy;  Laterality: N/A;  2:00   Family History  Problem Relation Age of Onset  . Colon cancer Neg Hx   . Liver disease Neg Hx    History  Substance Use Topics  . Smoking status: Never Smoker   . Smokeless tobacco: Not on file  . Alcohol Use: Yes     Comment: couple of drinks on weekends    Review of Systems ROS: Statement: All systems negative except as marked or noted in the HPI; Constitutional: Negative for fever and chills. ; ; Eyes: Negative for eye pain, redness and discharge. ; ; ENMT: Negative for ear pain, hoarseness, nasal congestion, sinus pressure and sore throat. ; ; Cardiovascular: Negative for chest pain, palpitations, diaphoresis, dyspnea and peripheral edema. ; ; Respiratory: Negative for cough, wheezing and stridor. ; ; Gastrointestinal: Negative for nausea, vomiting, diarrhea, abdominal pain, blood in stool, hematemesis, jaundice and rectal bleeding.; ;  Genitourinary: Negative for dysuria, flank pain and hematuria. ; ; Musculoskeletal: +left breast pain. Negative for back pain and neck pain. Negative for swelling and trauma.; ; Skin: Negative for pruritus, rash, abrasions, blisters, bruising and skin lesion.; ; Neuro: Negative for headache, lightheadedness and neck stiffness. Negative for weakness, altered level of consciousness , altered mental status, extremity weakness, paresthesias, involuntary movement, seizure and syncope.      Allergies  Review of patient's allergies indicates no known allergies.  Home Medications   Current Outpatient Rx  Name  Route  Sig  Dispense  Refill  . aspirin 81 MG tablet   Oral   Take 81 mg by mouth daily.         . budesonide-formoterol (SYMBICORT) 80-4.5 MCG/ACT inhaler   Inhalation   Inhale 2 puffs into the lungs every 12 (twelve) hours as needed. Shortness of breath         . divalproex (DEPAKOTE) 250 MG DR tablet   Oral   Take 250 mg by mouth 3 (three) times daily.         [name]. levalbuterol (XOPENEX HFA) 45 MCG/ACT inhaler   Inhalation   Inhale 1-2 puffs into the lungs every 4 (four) hours as needed. Shortness of breath         . naproxen (NAPROSYN) 250 MG tablet   Oral   Take  1 tablet (250 mg total) by mouth 2 (two) times daily with a meal.   14 tablet   0   . pravastatin (PRAVACHOL) 20 MG tablet   Oral   Take 20 mg by mouth daily.         [name] tetrahydrozoline (EYE DROPS) 0.05 % ophthalmic solution   Both Eyes   Place 1 drop into both eyes daily as needed. Dry Eyes         . traMADol (ULTRAM) 50 MG tablet   Oral   Take 1 tablet (50 mg total) by mouth every 6 (six) hours as needed.   15 tablet   0    BP 127/82  Pulse 77  Temp(Src) 98.1 F (36.7 C) (Oral)  Resp 20  Ht 5\' 2"  (1.575 m)  Wt 202 lb (91.627 kg)  BMI 36.94 kg/m2  SpO2 98% Physical Exam 1955: Physical examination:  Nursing notes reviewed; Vital signs and O2 SAT reviewed;  Constitutional: Well developed,  Well nourished, Well hydrated, In no acute distress; Head:  Normocephalic, atraumatic; Eyes: EOMI, PERRL, No scleral icterus; ENMT: Mouth and pharynx normal, Mucous membranes moist; Neck: Supple, Full range of motion, No lymphadenopathy; Cardiovascular: Regular rate and rhythm, No murmur, rub, or gallop; Respiratory: Breath sounds clear & equal bilaterally, No rales, rhonchi, wheezes.  Speaking full sentences with ease, Normal respiratory effort/excursion; Chest: Pendulous breasts bilat. +mild left medial breast tenderness to palp. No palp mass, no erythema, no ecchymosis, no edema, no open wounds, no nipple discharge. Nontender, Movement normal; Abdomen: Soft, Nontender, Nondistended, Normal bowel sounds; Genitourinary: No CVA tenderness; Extremities: Pulses normal, No tenderness, No edema, No calf edema or asymmetry.; Neuro: AA&Ox3, Major CN grossly intact.  Speech clear. No gross focal motor or sensory deficits in extremities. Climbs on and off stretcher easily by herself. Gait steady.; Skin: Color normal, Warm, Dry.   ED Course  Procedures  EKG Interpretation   None       MDM  MDM Reviewed: previous chart, nursing note and vitals Reviewed previous: labs, ECG and ultrasound Interpretation: labs, ECG and x-ray    Date: 04/29/2013  Rate: 75  Rhythm: normal sinus rhythm  QRS Axis: normal  Intervals: normal  ST/T Wave abnormalities: normal  Conduction Disutrbances:none  Narrative Interpretation:   Old EKG Reviewed: none available.  Results for orders placed during the hospital encounter of 04/29/13  CBC WITH DIFFERENTIAL      Result Value Range   WBC 6.6  4.0 - 10.5 K/uL   RBC 4.32  3.87 - 5.11 MIL/uL   Hemoglobin 13.4  12.0 - 15.0 g/dL   HCT 16.1  09.6 - 04.5 %   MCV 94.2  78.0 - 100.0 fL   MCH 31.0  26.0 - 34.0 pg   MCHC 32.9  30.0 - 36.0 g/dL   RDW 40.9  81.1 - 91.4 %   Platelets 265  150 - 400 K/uL   Neutrophils Relative % 48  43 - 77 %   Neutro Abs 3.2  1.7 - 7.7 K/uL    Lymphocytes Relative 41  12 - 46 %   Lymphs Abs 2.7  0.7 - 4.0 K/uL   Monocytes Relative 6  3 - 12 %   Monocytes Absolute 0.4  0.1 - 1.0 K/uL   Eosinophils Relative 4  0 - 5 %   Eosinophils Absolute 0.3  0.0 - 0.7 K/uL   Basophils Relative 1  0 - 1 %   Basophils Absolute 0.0  0.0 -  0.1 K/uL  COMPREHENSIVE METABOLIC PANEL      Result Value Range   Sodium 140  137 - 147 mEq/L   Potassium 3.7  3.7 - 5.3 mEq/L   Chloride 102  96 - 112 mEq/L   CO2 26  19 - 32 mEq/L   Glucose, Bld 82  70 - 99 mg/dL   BUN 16  6 - 23 mg/dL   Creatinine, Ser 1.61  0.50 - 1.10 mg/dL   Calcium 9.0  8.4 - 09.6 mg/dL   Total Protein 8.0  6.0 - 8.3 g/dL   Albumin 3.4 (*) 3.5 - 5.2 g/dL   AST 12  0 - 37 U/L   ALT 10  0 - 35 U/L   Alkaline Phosphatase 89  39 - 117 U/L   Total Bilirubin <0.2 (*) 0.3 - 1.2 mg/dL   GFR calc non Af Amer 85 (*) >90 mL/min   GFR calc Af Amer >90  >90 mL/min  TROPONIN I      Result Value Range   Troponin I <0.30  <0.30 ng/mL   Dg Chest 2 View 04/29/2013   CLINICAL DATA:  Left-sided chest pain for 2 weeks  EXAM: CHEST  2 VIEW  COMPARISON:  05/30/2008  FINDINGS: The heart size and mediastinal contours are within normal limits. Both lungs are clear. The visualized skeletal structures are unremarkable.  IMPRESSION: No active cardiopulmonary disease.   Electronically Signed   By: Alcide Clever M.D.   On: 04/29/2013 17:29   Mm Digital Screening 04/21/2013   CLINICAL DATA:  Screening.  EXAM: DIGITAL SCREENING BILATERAL MAMMOGRAM WITH CAD  COMPARISON:  Previous exam(s).  ACR Breast Density Category b: There are scattered areas of fibroglandular density.  FINDINGS: There are no findings suspicious for malignancy. Images were processed with CAD.  IMPRESSION: No mammographic evidence of malignancy. A result letter of this screening mammogram will be mailed directly to the patient.  RECOMMENDATION: Screening mammogram in one year. (Code:[id number]-B-01Y)  BI-RADS CATEGORY  1: Negative.   Electronically Signed    By: Annia Belt M.D.   On: 04/21/2013 09:09    2000:  Mammogram without concerning findings. WBC count normal, afebrile. Left breast without palp masses, no rash or area of fluctuance to suggest cellulitis or abscess at this time. Tx symptomatically and f/u with OB/GYN MD. Dx and testing d/w pt.  Questions answered.  Verb understanding, agreeable to d/c home with outpt f/u.        Laray Anger, DO 05/02/13 0000

## 2013-09-23 ENCOUNTER — Other Ambulatory Visit (HOSPITAL_COMMUNITY)
Admission: RE | Admit: 2013-09-23 | Discharge: 2013-09-23 | Disposition: A | Discharge status: Home / Self Care | Payer: Medicare Other | Source: Ambulatory Visit | Attending: Family Medicine | Admitting: Family Medicine

## 2013-09-23 ENCOUNTER — Other Ambulatory Visit: Payer: Self-pay | Admitting: Family Medicine

## 2013-09-23 DIAGNOSIS — R561 Post traumatic seizures: Secondary | ICD-10-CM | POA: Diagnosis not present

## 2013-09-23 DIAGNOSIS — Z124 Encounter for screening for malignant neoplasm of cervix: Secondary | ICD-10-CM | POA: Insufficient documentation

## 2013-09-23 DIAGNOSIS — Z01419 Encounter for gynecological examination (general) (routine) without abnormal findings: Secondary | ICD-10-CM | POA: Diagnosis not present

## 2013-09-23 DIAGNOSIS — N760 Acute vaginitis: Secondary | ICD-10-CM | POA: Diagnosis not present

## 2013-09-23 DIAGNOSIS — J45909 Unspecified asthma, uncomplicated: Secondary | ICD-10-CM | POA: Diagnosis not present

## 2013-09-27 LAB — CYTOLOGY - PAP

## 2013-11-16 DIAGNOSIS — H04129 Dry eye syndrome of unspecified lacrimal gland: Secondary | ICD-10-CM | POA: Diagnosis not present

## 2013-12-17 ENCOUNTER — Emergency Department (HOSPITAL_COMMUNITY)
Admission: EM | Admit: 2013-12-17 | Discharge: 2013-12-17 | Disposition: A | Discharge status: Home / Self Care | Payer: Medicare Other | Attending: Emergency Medicine | Admitting: Emergency Medicine

## 2013-12-17 ENCOUNTER — Emergency Department (HOSPITAL_COMMUNITY): Payer: Medicare Other

## 2013-12-17 ENCOUNTER — Encounter (HOSPITAL_COMMUNITY): Payer: Self-pay | Admitting: Emergency Medicine

## 2013-12-17 DIAGNOSIS — M25521 Pain in right elbow: Secondary | ICD-10-CM

## 2013-12-17 DIAGNOSIS — Z8673 Personal history of transient ischemic attack (TIA), and cerebral infarction without residual deficits: Secondary | ICD-10-CM | POA: Diagnosis not present

## 2013-12-17 DIAGNOSIS — M25529 Pain in unspecified elbow: Secondary | ICD-10-CM | POA: Diagnosis not present

## 2013-12-17 DIAGNOSIS — J45909 Unspecified asthma, uncomplicated: Secondary | ICD-10-CM | POA: Insufficient documentation

## 2013-12-17 DIAGNOSIS — E785 Hyperlipidemia, unspecified: Secondary | ICD-10-CM | POA: Diagnosis not present

## 2013-12-17 DIAGNOSIS — Z79899 Other long term (current) drug therapy: Secondary | ICD-10-CM | POA: Diagnosis not present

## 2013-12-17 DIAGNOSIS — I10 Essential (primary) hypertension: Secondary | ICD-10-CM | POA: Diagnosis not present

## 2013-12-17 DIAGNOSIS — Z791 Long term (current) use of non-steroidal anti-inflammatories (NSAID): Secondary | ICD-10-CM | POA: Insufficient documentation

## 2013-12-17 DIAGNOSIS — Z8659 Personal history of other mental and behavioral disorders: Secondary | ICD-10-CM | POA: Diagnosis not present

## 2013-12-17 MED ORDER — CYCLOBENZAPRINE HCL 10 MG PO TABS: 10.0000 mg | ORAL_TABLET | Freq: Two times a day (BID) | ORAL | Status: DC | PRN

## 2013-12-17 MED ORDER — PREDNISONE 50 MG PO TABS: ORAL_TABLET | ORAL | Status: DC

## 2013-12-17 NOTE — ED Notes (Addendum)
Pt reports right elbow pain, increased right sided weakness x4 days. Pt reports pain radiates from right elbow to right hand. Pt reports pain worse with movement. Pt reports weakness in right arm at baseline since stroke in 1990. Pt denies headache,changes in vision. nad noted. Pt denies new or recent injury.

## 2013-12-17 NOTE — ED Notes (Signed)
Pt states pain with movement of right elbow, denies pain to the touch. NAD. No recent injury, per pt.

## 2013-12-17 NOTE — ED Provider Notes (Signed)
CSN: [id number]     Arrival date & time 12/17/13  1013 History  This chart was scribed for Donnetta Hutching, MD by Annye Asa, ED Scribe. This patient was seen in room APA18/[facility]18 and the patient's care was started at 11:17 AM.    Chief Complaint  Patient presents with  . Elbow Pain   The history is provided by the patient. No language interpreter was used.    HPI Comments: Patty [name] is a 48 y.o. female who presents to the Emergency Department complaining of 3-4 days of gradual onset, constant pain to the tip of right elbow and flexor aspect. She denies recent injury or trauma. Patient is currently on disability after a past stroke (1990); she notes that her stroke affected her speech and the right side of her body; secondary impairment following stroke noted.   Past Medical History  Diagnosis Date  . Stroke 1990    chronic right hemiparesis  . Asthma   . Hyperlipidemia   . HTN (hypertension)   . Depression   . Seizures     remote  . DDD (degenerative disc disease), lumbar    Past Surgical History  Procedure Laterality Date  . Partial hysterectomy    . Cesarean section    . Colonoscopy  12/24/2011    Procedure: COLONOSCOPY;  Surgeon: West Bali, MD;  Location: [facility];  Service: Endoscopy;  Laterality: N/A;  2:00   Family History  Problem Relation Age of Onset  . Colon cancer Neg Hx   . Liver disease Neg Hx    History  Substance Use Topics  . Smoking status: Never Smoker   . Smokeless tobacco: Not on file  . Alcohol Use: Yes     Comment: couple of drinks on weekends   OB History   Grav Para Term Preterm Abortions TAB SAB Ect Mult Living                 Review of Systems  A complete 10 system review of systems was obtained and all systems are negative except as noted in the HPI and PMH.   Allergies  Review of patient's allergies indicates no known allergies.  Home Medications   Prior to Admission medications   Medication Sig Start Date End Date Taking?  Authorizing Provider  budesonide-formoterol (SYMBICORT) 80-4.5 MCG/ACT inhaler Inhale 2 puffs into the lungs every 12 (twelve) hours as needed. Shortness of breath   Yes Historical Provider, MD  divalproex (DEPAKOTE) 250 MG DR tablet Take 250 mg by mouth 3 (three) times daily.   Yes Historical Provider, MD  ibuprofen (ADVIL,MOTRIN) 200 MG tablet Take 800 mg by mouth daily as needed for moderate pain.   Yes Historical Provider, MD  levalbuterol [facility] HFA) 45 MCG/ACT inhaler Inhale 1-2 puffs into the lungs every 4 (four) hours as needed. Shortness of breath   Yes Historical Provider, MD  pravastatin (PRAVACHOL) 20 MG tablet Take 20 mg by mouth at bedtime.    Yes Historical Provider, MD  tetrahydrozoline (EYE DROPS) 0.05 % ophthalmic solution Place 1 drop into both eyes daily as needed. Dry Eyes   Yes Historical Provider, MD  verapamil (CALAN) 120 MG tablet Take 1 tablet by mouth daily. 09/23/13  Yes Historical Provider, MD  cyclobenzaprine (FLEXERIL) 10 MG tablet Take 1 tablet (10 mg total) by mouth 2 (two) times daily as needed for muscle spasms. 12/17/13   Donnetta Hutching, MD  predniSONE (DELTASONE) 50 MG tablet One tablet daily for 6 days  12/17/13   Donnetta Hutching, MD   BP 141/83  Pulse 73  Temp(Src) 98.4 F (36.9 C) (Oral)  Resp 18  Ht  (1.575 m)  Wt 210 lb (95.255 kg)  BMI 38.40 kg/m2  SpO2 99% Physical Exam  Nursing note and vitals reviewed. Constitutional: She is oriented to person, place, and time. She appears well-developed and well-nourished.  HENT:  Head: Normocephalic and atraumatic.  Eyes: Conjunctivae and EOM are normal. Pupils are equal, round, and reactive to light.  Neck: Normal range of motion. Neck supple.  Cardiovascular: Normal rate, regular rhythm and normal heart sounds.   Pulmonary/Chest: Effort normal and breath sounds normal.  Abdominal: Soft. Bowel sounds are normal.  Musculoskeletal: Normal range of motion.  Generalized tenderness to right elbow. ROM impaired  secondary to prior CVA.   Neurological: She is alert and oriented to person, place, and time.  Skin: Skin is warm and dry.  Psychiatric: She has a normal mood and affect. Her behavior is normal.    ED Course  Procedures   DIAGNOSTIC STUDIES: Oxygen Saturation is 99% on RA, normal by my interpretation.    COORDINATION OF CARE: 11:20 AM Discussed treatment plan with pt at bedside; x-ray of the patient's right elbow. Patient agreed to plan.  Labs Review Labs Reviewed - No data to display  Imaging Review Dg Elbow Complete Right  12/17/2013   CLINICAL DATA:  Pain  EXAM: RIGHT ELBOW - COMPLETE 3+ VIEW  COMPARISON:  None.  FINDINGS: Frontal, lateral, and bilateral oblique views were obtained. There is no fracture, dislocation, or effusion. Joint spaces appear intact. No erosive change.  IMPRESSION: No abnormality noted.   Electronically Signed   By: Bretta Bang M.D.   On: 12/17/2013 12:03     EKG Interpretation None      MDM   Final diagnoses:  Right elbow pain   no trauma to right elbow. No evidence of olecranon bursitis or cellulitis. Most likely pain is related to tendinitis. Rx prednisone and Flexeril 10 mg.  I personally performed the services described in this documentation, which was scribed in my presence. The recorded information has been reviewed and is accurate.      Donnetta Hutching, MD 12/17/13 1251

## 2013-12-17 NOTE — Discharge Instructions (Signed)
X-ray was normal. Prescriptions for prednisone and a muscle relaxer. Apply ice to elbow.

## 2013-12-29 ENCOUNTER — Emergency Department (HOSPITAL_COMMUNITY)
Admission: EM | Admit: 2013-12-29 | Discharge: 2013-12-29 | Discharge status: Against Medical Advice | Payer: Medicare Other | Attending: Emergency Medicine | Admitting: Emergency Medicine

## 2013-12-29 ENCOUNTER — Encounter (HOSPITAL_COMMUNITY): Payer: Self-pay | Admitting: Emergency Medicine

## 2013-12-29 DIAGNOSIS — I10 Essential (primary) hypertension: Secondary | ICD-10-CM | POA: Diagnosis not present

## 2013-12-29 DIAGNOSIS — J45909 Unspecified asthma, uncomplicated: Secondary | ICD-10-CM | POA: Diagnosis not present

## 2013-12-29 DIAGNOSIS — R42 Dizziness and giddiness: Secondary | ICD-10-CM | POA: Diagnosis not present

## 2013-12-29 NOTE — ED Notes (Signed)
Pt called x2 no answer 

## 2013-12-29 NOTE — ED Notes (Signed)
Pt called x 3 no answer 

## 2013-12-29 NOTE — ED Notes (Signed)
Pt called x 1 no answer

## 2013-12-29 NOTE — ED Notes (Signed)
Pt with dizziness since Monday, sob and nausea, denies emesis

## 2013-12-30 DIAGNOSIS — J4520 Mild intermittent asthma, uncomplicated: Secondary | ICD-10-CM | POA: Diagnosis not present

## 2013-12-30 DIAGNOSIS — M25521 Pain in right elbow: Secondary | ICD-10-CM | POA: Diagnosis not present

## 2014-02-16 DIAGNOSIS — H40023 Open angle with borderline findings, high risk, bilateral: Secondary | ICD-10-CM | POA: Diagnosis not present

## 2014-04-19 ENCOUNTER — Other Ambulatory Visit (HOSPITAL_COMMUNITY): Payer: Self-pay | Admitting: Family Medicine

## 2014-04-19 DIAGNOSIS — Z1231 Encounter for screening mammogram for malignant neoplasm of breast: Secondary | ICD-10-CM

## 2014-04-21 ENCOUNTER — Ambulatory Visit (HOSPITAL_COMMUNITY): Payer: Medicare Other

## 2014-05-02 ENCOUNTER — Other Ambulatory Visit (HOSPITAL_COMMUNITY): Payer: Self-pay | Admitting: Family Medicine

## 2014-05-02 DIAGNOSIS — N644 Mastodynia: Secondary | ICD-10-CM

## 2014-05-10 ENCOUNTER — Other Ambulatory Visit (HOSPITAL_COMMUNITY): Payer: Self-pay | Admitting: Family Medicine

## 2014-05-10 ENCOUNTER — Ambulatory Visit (HOSPITAL_COMMUNITY)
Admission: RE | Admit: 2014-05-10 | Discharge: 2014-05-10 | Disposition: A | Discharge status: Home / Self Care | Payer: Medicare Other | Source: Ambulatory Visit | Attending: Family Medicine | Admitting: Family Medicine

## 2014-05-10 DIAGNOSIS — N644 Mastodynia: Secondary | ICD-10-CM

## 2014-07-11 ENCOUNTER — Emergency Department (HOSPITAL_COMMUNITY)
Admission: EM | Admit: 2014-07-11 | Discharge: 2014-07-11 | Disposition: A | Discharge status: Home / Self Care | Payer: Medicare Other | Source: Home / Self Care | Attending: Emergency Medicine | Admitting: Emergency Medicine

## 2014-07-11 ENCOUNTER — Encounter (HOSPITAL_COMMUNITY): Payer: Self-pay | Admitting: *Deleted

## 2014-07-11 ENCOUNTER — Emergency Department (HOSPITAL_COMMUNITY): Payer: Medicare Other

## 2014-07-11 DIAGNOSIS — F329 Major depressive disorder, single episode, unspecified: Secondary | ICD-10-CM | POA: Insufficient documentation

## 2014-07-11 DIAGNOSIS — Z90711 Acquired absence of uterus with remaining cervical stump: Secondary | ICD-10-CM | POA: Diagnosis present

## 2014-07-11 DIAGNOSIS — E785 Hyperlipidemia, unspecified: Secondary | ICD-10-CM | POA: Diagnosis present

## 2014-07-11 DIAGNOSIS — K8000 Calculus of gallbladder with acute cholecystitis without obstruction: Secondary | ICD-10-CM | POA: Diagnosis not present

## 2014-07-11 DIAGNOSIS — J45909 Unspecified asthma, uncomplicated: Secondary | ICD-10-CM

## 2014-07-11 DIAGNOSIS — Z9889 Other specified postprocedural states: Secondary | ICD-10-CM

## 2014-07-11 DIAGNOSIS — Z90710 Acquired absence of both cervix and uterus: Secondary | ICD-10-CM | POA: Insufficient documentation

## 2014-07-11 DIAGNOSIS — M5136 Other intervertebral disc degeneration, lumbar region: Secondary | ICD-10-CM | POA: Diagnosis present

## 2014-07-11 DIAGNOSIS — I69928 Other speech and language deficits following unspecified cerebrovascular disease: Secondary | ICD-10-CM | POA: Diagnosis not present

## 2014-07-11 DIAGNOSIS — R1013 Epigastric pain: Secondary | ICD-10-CM | POA: Insufficient documentation

## 2014-07-11 DIAGNOSIS — L98499 Non-pressure chronic ulcer of skin of other sites with unspecified severity: Secondary | ICD-10-CM | POA: Diagnosis present

## 2014-07-11 DIAGNOSIS — K8020 Calculus of gallbladder without cholecystitis without obstruction: Secondary | ICD-10-CM | POA: Diagnosis not present

## 2014-07-11 DIAGNOSIS — I69951 Hemiplegia and hemiparesis following unspecified cerebrovascular disease affecting right dominant side: Secondary | ICD-10-CM | POA: Diagnosis not present

## 2014-07-11 DIAGNOSIS — Z8739 Personal history of other diseases of the musculoskeletal system and connective tissue: Secondary | ICD-10-CM | POA: Insufficient documentation

## 2014-07-11 DIAGNOSIS — Z79899 Other long term (current) drug therapy: Secondary | ICD-10-CM

## 2014-07-11 DIAGNOSIS — Z7951 Long term (current) use of inhaled steroids: Secondary | ICD-10-CM

## 2014-07-11 DIAGNOSIS — I10 Essential (primary) hypertension: Secondary | ICD-10-CM

## 2014-07-11 DIAGNOSIS — R569 Unspecified convulsions: Secondary | ICD-10-CM | POA: Diagnosis not present

## 2014-07-11 DIAGNOSIS — Z418 Encounter for other procedures for purposes other than remedying health state: Secondary | ICD-10-CM | POA: Diagnosis not present

## 2014-07-11 DIAGNOSIS — Z8673 Personal history of transient ischemic attack (TIA), and cerebral infarction without residual deficits: Secondary | ICD-10-CM | POA: Insufficient documentation

## 2014-07-11 DIAGNOSIS — Z01818 Encounter for other preprocedural examination: Secondary | ICD-10-CM | POA: Diagnosis not present

## 2014-07-11 DIAGNOSIS — K8080 Other cholelithiasis without obstruction: Secondary | ICD-10-CM

## 2014-07-11 DIAGNOSIS — R1011 Right upper quadrant pain: Secondary | ICD-10-CM

## 2014-07-11 DIAGNOSIS — K8050 Calculus of bile duct without cholangitis or cholecystitis without obstruction: Secondary | ICD-10-CM | POA: Diagnosis not present

## 2014-07-11 DIAGNOSIS — Z791 Long term (current) use of non-steroidal anti-inflammatories (NSAID): Secondary | ICD-10-CM | POA: Insufficient documentation

## 2014-07-11 LAB — COMPREHENSIVE METABOLIC PANEL
ALBUMIN: 3.8 g/dL (ref 3.5–5.2)
ALK PHOS: 103 U/L (ref 39–117)
ALT: 23 U/L (ref 0–35)
ANION GAP: 9 (ref 5–15)
AST: 24 U/L (ref 0–37)
BILIRUBIN TOTAL: 0.4 mg/dL (ref 0.3–1.2)
BUN: 25 mg/dL — ABNORMAL HIGH (ref 6–23)
CALCIUM: 9 mg/dL (ref 8.4–10.5)
CO2: 26 mmol/L (ref 19–32)
Chloride: 103 mmol/L (ref 96–112)
Creatinine, Ser: 0.84 mg/dL (ref 0.50–1.10)
GFR calc non Af Amer: 80 mL/min — ABNORMAL LOW (ref >90)
GLUCOSE: 117 mg/dL — AB (ref 70–99)
Potassium: 3.4 mmol/L — ABNORMAL LOW (ref 3.5–5.1)
Sodium: 138 mmol/L (ref 135–145)
Total Protein: 7.8 g/dL (ref 6.0–8.3)

## 2014-07-11 LAB — CBC WITH DIFFERENTIAL/PLATELET
Basophils Absolute: 0 10*3/uL (ref 0.0–0.1)
Basophils Relative: 0 % (ref 0–1)
EOS PCT: 6 % — AB (ref 0–5)
Eosinophils Absolute: 0.4 10*3/uL (ref 0.0–0.7)
HEMATOCRIT: 40.6 % (ref 36.0–46.0)
Hemoglobin: 13.5 g/dL (ref 12.0–15.0)
LYMPHS ABS: 2.3 10*3/uL (ref 0.7–4.0)
Lymphocytes Relative: 30 % (ref 12–46)
MCH: 31 pg (ref 26.0–34.0)
MCHC: 33.3 g/dL (ref 30.0–36.0)
MCV: 93.3 fL (ref 78.0–100.0)
Monocytes Absolute: 0.6 10*3/uL (ref 0.1–1.0)
Monocytes Relative: 8 % (ref 3–12)
NEUTROS PCT: 56 % (ref 43–77)
Neutro Abs: 4.3 10*3/uL (ref 1.7–7.7)
Platelets: 279 10*3/uL (ref 150–400)
RBC: 4.35 MIL/uL (ref 3.87–5.11)
RDW: 12.7 % (ref 11.5–15.5)
WBC: 7.6 10*3/uL (ref 4.0–10.5)

## 2014-07-11 LAB — LIPASE, BLOOD
LIPASE: 75 U/L — AB (ref 11–59)
Lipase: 149 U/L — ABNORMAL HIGH (ref 11–59)

## 2014-07-11 MED ORDER — ONDANSETRON HCL 4 MG PO TABS: 4.0000 mg | ORAL_TABLET | Freq: Three times a day (TID) | ORAL | Status: DC | PRN

## 2014-07-11 MED ORDER — MORPHINE SULFATE 4 MG/ML IJ SOLN
4.0000 mg | Freq: Once | INTRAMUSCULAR | Status: AC
  Administered 2014-07-11: 4 mg via INTRAVENOUS
  Filled 2014-07-11: qty 1

## 2014-07-11 MED ORDER — ONDANSETRON HCL 4 MG/2ML IJ SOLN
4.0000 mg | Freq: Once | INTRAMUSCULAR | Status: AC
  Administered 2014-07-11: 4 mg via INTRAVENOUS
  Filled 2014-07-11: qty 2

## 2014-07-11 MED ORDER — ONDANSETRON HCL 4 MG/2ML IJ SOLN
INTRAMUSCULAR | Status: AC
  Administered 2014-07-11: 4 mg
  Filled 2014-07-11: qty 2

## 2014-07-11 MED ORDER — FENTANYL CITRATE 0.05 MG/ML IJ SOLN
50.0000 ug | Freq: Once | INTRAMUSCULAR | Status: AC
  Administered 2014-07-11: 50 ug via INTRAVENOUS

## 2014-07-11 MED ORDER — FENTANYL CITRATE 0.05 MG/ML IJ SOLN
INTRAMUSCULAR | Status: AC
  Filled 2014-07-11: qty 2

## 2014-07-11 MED ORDER — OXYCODONE-ACETAMINOPHEN 5-325 MG PO TABS
1.0000 | ORAL_TABLET | Freq: Once | ORAL | Status: AC
  Administered 2014-07-11: 1 via ORAL
  Filled 2014-07-11: qty 1

## 2014-07-11 MED ORDER — OXYCODONE-ACETAMINOPHEN 5-325 MG PO TABS: 1.0000 | ORAL_TABLET | Freq: Four times a day (QID) | ORAL | Status: DC | PRN

## 2014-07-11 MED ORDER — FENTANYL CITRATE 0.05 MG/ML IJ SOLN
50.0000 ug | Freq: Once | INTRAMUSCULAR | Status: AC
  Administered 2014-07-11: 50 ug via INTRAVENOUS
  Filled 2014-07-11: qty 2

## 2014-07-11 MED ORDER — SODIUM CHLORIDE 0.9 % IV BOLUS (SEPSIS)
1000.0000 mL | Freq: Once | INTRAVENOUS | Status: AC
  Administered 2014-07-11: 1000 mL via INTRAVENOUS

## 2014-07-11 NOTE — ED Provider Notes (Signed)
Signed out by Dr. Lynelle DoctorKnapp pending right upper quadrant ultrasound. Right upper quadrant ultrasound shows cholelithiasis with a large stone in the gallbladder neck. No evidence of cholecystitis. On recheck, patient continues to endorse pain.  She has right upper quadrant tenderness. Current pain is 8 out of 10. Will redose his pain medication and reassess.  10:34 AM Patient requesting ice water. Repeat lipase downtrending. Suspect symptomatic cholelithiasis. No evidence of cholecystitis. Will by mouth challenge and discharged home with pain medication and surgery follow-up.  Results for orders placed or performed during the hospital encounter of 07/11/14  CBC with Differential  Result Value Ref Range   WBC 7.6 4.0 - 10.5 K/uL   RBC 4.35 3.87 - 5.11 MIL/uL   Hemoglobin 13.5 12.0 - 15.0 g/dL   HCT 40.940.6 81.136.0 - 91.446.0 %   MCV 93.3 78.0 - 100.0 fL   MCH 31.0 26.0 - 34.0 pg   MCHC 33.3 30.0 - 36.0 g/dL   RDW 78.212.7 95.611.5 - 21.315.5 %   Platelets 279 150 - 400 K/uL   Neutrophils Relative % 56 43 - 77 %   Neutro Abs 4.3 1.7 - 7.7 K/uL   Lymphocytes Relative 30 12 - 46 %   Lymphs Abs 2.3 0.7 - 4.0 K/uL   Monocytes Relative 8 3 - 12 %   Monocytes Absolute 0.6 0.1 - 1.0 K/uL   Eosinophils Relative 6 (H) 0 - 5 %   Eosinophils Absolute 0.4 0.0 - 0.7 K/uL   Basophils Relative 0 0 - 1 %   Basophils Absolute 0.0 0.0 - 0.1 K/uL  Comprehensive metabolic panel  Result Value Ref Range   Sodium 138 135 - 145 mmol/L   Potassium 3.4 (L) 3.5 - 5.1 mmol/L   Chloride 103 96 - 112 mmol/L   CO2 26 19 - 32 mmol/L   Glucose, Bld 117 (H) 70 - 99 mg/dL   BUN 25 (H) 6 - 23 mg/dL   Creatinine, Ser 0.860.84 0.50 - 1.10 mg/dL   Calcium 9.0 8.4 - 57.810.5 mg/dL   Total Protein 7.8 6.0 - 8.3 g/dL   Albumin 3.8 3.5 - 5.2 g/dL   AST 24 0 - 37 U/L   ALT 23 0 - 35 U/L   Alkaline Phosphatase 103 39 - 117 U/L   Total Bilirubin 0.4 0.3 - 1.2 mg/dL   GFR calc non Af Amer 80 (L) >90 mL/min   GFR calc Af Amer >90 >90 mL/min   Anion gap 9  5 - 15  Lipase, blood  Result Value Ref Range   Lipase 149 (H) 11 - 59 U/L   [place] Abdomen Limited Ruq  07/11/2014   CLINICAL DATA:  Right upper quadrant pain for one night.  EXAM: US ABDOMEN LIMITED - RIGHT UPPER QUADRANT  COMPARISON:  None.  FINDINGS: Gallbladder:  The 2.7 cm stone is identified in the neck of the gallbladder. There is no gallbladder wall thickening or pericholecystic fluid. Sonographer reports negative Murphy's sign.  Common bile duct:  Diameter: 0.5 cm  Liver:  No focal lesion identified. Within normal limits in parenchymal echogenicity.  IMPRESSION: 2.7 cm stone in the neck of the gallbladder. Negative for evidence of cholecystitis.  Right upper Please select the correct template based on the type of Limited Abdominal US exam - i.e. RUQ, Ascites, Appendix, Pylorus, etc   Electronically Signed   By: Drusilla Kannerhomas  Dalessio M.D.   On: 07/11/2014 07:44      Shon Batonourtney F Kahley Leib, MD 07/11/14 1034

## 2014-07-11 NOTE — ED Notes (Signed)
Patient to restroom. Patient missed the urine cup. No specimen obtained at this time.

## 2014-07-11 NOTE — ED Provider Notes (Signed)
CSN: [id number]     Arrival date & time 07/11/14  0456 History   First MD Initiated Contact with Patient 07/11/14 [phone]     Chief Complaint  Patient presents with  . Abdominal Pain     (Consider location/radiation/quality/duration/timing/severity/associated sxs/prior Treatment) HPI  Patient states about 1 hour ago while she was watching TV she had acute onset of right sided abdominal pain. She denies any pain in her back or her flank. She had nausea and a couple episodes of vomiting. She denies diarrhea. She states she's been having discomfort but not this bad for the last few months but she does not associate it with food or racing she eats. She states she normally takes Advil and it goes away in about an hour. She states last night she ate boiled neck bones. She denies any blood in her urine or fever. She denies significant caffeine or milk ingestion. She states she was taking Advil for chest wall pain that her doctor told her to stop taking it about 6 months ago. She now takes Aleve about 3 times a week whenever she has pains. She states she took 2 Aleve today and it did not relieve her pain. She does report her sister recently had surgery for gallstones.    PCP Dr Loleta Chance  Past Medical History  Diagnosis Date  . Stroke 1990    chronic right hemiparesis  . Asthma   . Hyperlipidemia   . HTN (hypertension)   . Depression   . Seizures     remote  . DDD (degenerative disc disease), lumbar    Past Surgical History  Procedure Laterality Date  . Partial hysterectomy    . Cesarean section    . Colonoscopy  12/24/2011    Procedure: COLONOSCOPY;  Surgeon: West Bali, MD;  Location: [facility];  Service: Endoscopy;  Laterality: N/A;  2:00   Family History  Problem Relation Age of Onset  . Colon cancer Neg Hx   . Liver disease Neg Hx    History  Substance Use Topics  . Smoking status: Never Smoker   . Smokeless tobacco: Not on file  . Alcohol Use: Yes     Comment: couple of  drinks on weekends   On disability b/o prior stroke and seizures Drinks 1 can of beer on weekends, only 1 yesterday  OB History    No data available     Review of Systems  All other systems reviewed and are negative.     Allergies  Review of patient's allergies indicates no known allergies.  Home Medications   Prior to Admission medications   Medication Sig Start Date End Date Taking? Authorizing Provider  budesonide-formoterol (SYMBICORT) 80-4.5 MCG/ACT inhaler Inhale 2 puffs into the lungs every 12 (twelve) hours as needed. Shortness of breath    Historical Provider, MD  cyclobenzaprine (FLEXERIL) 10 MG tablet Take 1 tablet (10 mg total) by mouth 2 (two) times daily as needed for muscle spasms. 12/17/13   Donnetta Hutching, MD  divalproex (DEPAKOTE) 250 MG DR tablet Take 250 mg by mouth 3 (three) times daily.    Historical Provider, MD  ibuprofen (ADVIL,MOTRIN) 200 MG tablet Take 800 mg by mouth daily as needed for moderate pain.    Historical Provider, MD  levalbuterol [facility] HFA) 45 MCG/ACT inhaler Inhale 1-2 puffs into the lungs every 4 (four) hours as needed. Shortness of breath    Historical Provider, MD  pravastatin (PRAVACHOL) 20 MG tablet Take 20 mg by mouth  at bedtime.     Historical Provider, MD  predniSONE (DELTASONE) 50 MG tablet One tablet daily for 6 days 12/17/13   Donnetta HutchingBrian Cook, MD  tetrahydrozoline (EYE DROPS) 0.05 % ophthalmic solution Place 1 drop into both eyes daily as needed. Dry Eyes    Historical Provider, MD  verapamil (CALAN) 120 MG tablet Take 1 tablet by mouth daily. 09/23/13   Historical Provider, MD   BP 147/119 mmHg  Pulse 78  Temp(Src) 97.6 F (36.4 C) (Oral)  Resp 22  Ht 5\' 2"  (1.575 m)  Wt 220 lb (99.791 kg)  BMI 40.23 kg/m2  SpO2 98%  Vital signs normal   Physical Exam  Constitutional: She is oriented to person, place, and time. She appears well-developed and well-nourished.  Non-toxic appearance. She does not appear ill. She appears distressed.    HENT:  Head: Normocephalic and atraumatic.  Right Ear: External ear normal.  Left Ear: External ear normal.  Nose: Nose normal. No mucosal edema or rhinorrhea.  Mouth/Throat: Oropharynx is clear and moist and mucous membranes are normal. No dental abscesses or uvula swelling.  Eyes: Conjunctivae and EOM are normal. Pupils are equal, round, and reactive to light.  Neck: Normal range of motion and full passive range of motion without pain. Neck supple.  Cardiovascular: Normal rate, regular rhythm and normal heart sounds.  Exam reveals no gallop and no friction rub.   No murmur heard. Pulmonary/Chest: Effort normal and breath sounds normal. No respiratory distress. She has no wheezes. She has no rhonchi. She has no rales. She exhibits no tenderness and no crepitus.  Abdominal: Soft. Normal appearance and bowel sounds are normal. She exhibits distension. There is no tenderness. There is no rebound and no guarding.    Patient has tenderness in the epigastric area but she is most tender in the right upper quadrant.  Musculoskeletal: Normal range of motion. She exhibits no edema or tenderness.  Moves all extremities well.   Neurological: She is alert and oriented to person, place, and time. She has normal strength. No cranial nerve deficit.  Skin: Skin is warm, dry and intact. No rash noted. No erythema. No pallor.  Psychiatric: She has a normal mood and affect. Her speech is normal and behavior is normal. Her mood appears not anxious.  Nursing note and vitals reviewed.   ED Course  Procedures (including critical care time)  Medications  fentaNYL (SUBLIMAZE) 0.05 MG/ML injection (not administered)  ondansetron (ZOFRAN) 4 MG/2ML injection (4 mg  Given 07/11/14 0522)  sodium chloride 0.9 % bolus 1,000 mL (1,000 mLs Intravenous New Bag/Given 07/11/14 0532)  fentaNYL (SUBLIMAZE) injection 50 mcg (50 mcg Intravenous Given 07/11/14 0531)  fentaNYL (SUBLIMAZE) injection 50 mcg (50 mcg Intravenous  Given 07/11/14 0642)   Pt was given IV fluids and IV nausea and pain medication.   Pt left at change of shift with Dr Wilkie AyeHorton to get results of her US  Labs Review Results for orders placed or performed during the hospital encounter of 07/11/14  CBC with Differential  Result Value Ref Range   WBC 7.6 4.0 - 10.5 K/uL   RBC 4.35 3.87 - 5.11 MIL/uL   Hemoglobin 13.5 12.0 - 15.0 g/dL   HCT 04.540.6 40.936.0 - 81.146.0 %   MCV 93.3 78.0 - 100.0 fL   MCH 31.0 26.0 - 34.0 pg   MCHC 33.3 30.0 - 36.0 g/dL   RDW 91.412.7 78.211.5 - 95.615.5 %   Platelets 279 150 - 400 K/uL   Neutrophils Relative %  56 43 - 77 %   Neutro Abs 4.3 1.7 - 7.7 K/uL   Lymphocytes Relative 30 12 - 46 %   Lymphs Abs 2.3 0.7 - 4.0 K/uL   Monocytes Relative 8 3 - 12 %   Monocytes Absolute 0.6 0.1 - 1.0 K/uL   Eosinophils Relative 6 (H) 0 - 5 %   Eosinophils Absolute 0.4 0.0 - 0.7 K/uL   Basophils Relative 0 0 - 1 %   Basophils Absolute 0.0 0.0 - 0.1 K/uL  Comprehensive metabolic panel  Result Value Ref Range   Sodium 138 135 - 145 mmol/L   Potassium 3.4 (L) 3.5 - 5.1 mmol/L   Chloride 103 96 - 112 mmol/L   CO2 26 19 - 32 mmol/L   Glucose, Bld 117 (H) 70 - 99 mg/dL   BUN 25 (H) 6 - 23 mg/dL   Creatinine, Ser 9.56 0.50 - 1.10 mg/dL   Calcium 9.0 8.4 - 21.3 mg/dL   Total Protein 7.8 6.0 - 8.3 g/dL   Albumin 3.8 3.5 - 5.2 g/dL   AST 24 0 - 37 U/L   ALT 23 0 - 35 U/L   Alkaline Phosphatase 103 39 - 117 U/L   Total Bilirubin 0.4 0.3 - 1.2 mg/dL   GFR calc non Af Amer 80 (L) >90 mL/min   GFR calc Af Amer >90 >90 mL/min   Anion gap 9 5 - 15  Lipase, blood  Result Value Ref Range   Lipase 149 (H) 11 - 59 U/L   Laboratory interpretation all normal except elevated lipase     Imaging Review No results found.   EKG Interpretation None      MDM   Final diagnoses:  RUQ pain  Epigastric abdominal pain    Disposition pending   Devoria Albe, MD, Concha Pyo, MD 07/11/14 [phone]

## 2014-07-11 NOTE — ED Notes (Signed)
Patient with no complaints at this time. Respirations even and unlabored. Skin warm/dry. Discharge instructions reviewed with patient at this time. Patient given opportunity to voice concerns/ask questions. IV removed per policy and band-aid applied to site. Patient stated she drove herself to ED, RN informed patient she could not drive until 3 pm due to narcotic pain medication administration. Patient states she called a family member for ride. Patient discharged at this time and left Emergency Department via wheelchair to wait for ride. Security informed about patient waiting for ride and that she cannot drive until 3 pm.

## 2014-07-11 NOTE — ED Notes (Signed)
Pt c/o right lower abdominal pain x 1 hour with n/v

## 2014-07-11 NOTE — Discharge Instructions (Signed)
Cholelithiasis °Cholelithiasis (also called gallstones) is a form of gallbladder disease in which gallstones form in your gallbladder. The gallbladder is an organ that stores bile made in the liver, which helps digest fats. Gallstones begin as small crystals and slowly grow into stones. Gallstone pain occurs when the gallbladder spasms and a gallstone is blocking the duct. Pain can also occur when a stone passes out of the duct.  °RISK FACTORS °· Being female.   °· Having multiple pregnancies. Health care providers sometimes advise removing diseased gallbladders before future pregnancies.   °· Being obese. °· Eating a diet heavy in fried foods and fat.   °· Being older than 60 years and increasing age.   °· Prolonged use of medicines containing female hormones.   °· Having diabetes mellitus.   °· Rapidly losing weight.   °· Having a family history of gallstones (heredity).   °SYMPTOMS °· Nausea.   °· Vomiting. °· Abdominal pain.   °· Yellowing of the skin (jaundice).   °· Sudden pain. It may persist from several minutes to several hours. °· Fever.   °· Tenderness to the touch.  °In some cases, when gallstones do not move into the bile duct, people have no pain or symptoms. These are called "silent" gallstones.  °TREATMENT °Silent gallstones do not need treatment. In severe cases, emergency surgery may be required. Options for treatment include: °· Surgery to remove the gallbladder. This is the most common treatment. °· Medicines. These do not always work and may take 6-12 months or more to work. °· Shock wave treatment (extracorporeal biliary lithotripsy). In this treatment an ultrasound machine sends shock waves to the gallbladder to break gallstones into smaller pieces that can pass into the intestines or be dissolved by medicine. °HOME CARE INSTRUCTIONS  °· Only take over-the-counter or prescription medicines for pain, discomfort, or fever as directed by your health care provider.   °· Follow a low-fat diet until  seen again by your health care provider. Fat causes the gallbladder to contract, which can result in pain.   °· Follow up with your health care provider as directed. Attacks are almost always recurrent and surgery is usually required for permanent treatment.   °SEEK IMMEDIATE MEDICAL CARE IF:  °· Your pain increases and is not controlled by medicines.   °· You have a fever or persistent symptoms for more than 2-3 days.   °· You have a fever and your symptoms suddenly get worse.   °· You have persistent nausea and vomiting.   °MAKE SURE YOU:  °· Understand these instructions. °· Will watch your condition. °· Will get help right away if you are not doing well or get worse. °Document Released: 03/14/2005 Document Revised: 11/18/2012 Document Reviewed: 09/09/2012 °[employer]® Patient Information ©2015 [employer]. This information is not intended to replace advice given to you by your health care provider. Make sure you discuss any questions you have with your health care provider. ° °

## 2014-07-11 NOTE — ED Notes (Signed)
Ginger ale given for PO challenge.

## 2014-07-12 ENCOUNTER — Observation Stay (HOSPITAL_COMMUNITY): Payer: Medicare Other

## 2014-07-12 ENCOUNTER — Encounter (HOSPITAL_COMMUNITY): Payer: Self-pay | Admitting: Emergency Medicine

## 2014-07-12 ENCOUNTER — Inpatient Hospital Stay (HOSPITAL_COMMUNITY)
Admission: EM | Admit: 2014-07-12 | Discharge: 2014-07-17 | DRG: 415 | Disposition: A | Discharge status: Home / Self Care | Payer: Medicare Other | Attending: General Surgery | Admitting: General Surgery

## 2014-07-12 DIAGNOSIS — Z418 Encounter for other procedures for purposes other than remedying health state: Secondary | ICD-10-CM | POA: Diagnosis not present

## 2014-07-12 DIAGNOSIS — K8000 Calculus of gallbladder with acute cholecystitis without obstruction: Secondary | ICD-10-CM | POA: Diagnosis present

## 2014-07-12 DIAGNOSIS — K8010 Calculus of gallbladder with chronic cholecystitis without obstruction: Secondary | ICD-10-CM | POA: Diagnosis present

## 2014-07-12 DIAGNOSIS — R1011 Right upper quadrant pain: Secondary | ICD-10-CM | POA: Diagnosis not present

## 2014-07-12 DIAGNOSIS — R112 Nausea with vomiting, unspecified: Secondary | ICD-10-CM | POA: Diagnosis not present

## 2014-07-12 DIAGNOSIS — Z01818 Encounter for other preprocedural examination: Secondary | ICD-10-CM

## 2014-07-12 DIAGNOSIS — R0602 Shortness of breath: Secondary | ICD-10-CM | POA: Diagnosis not present

## 2014-07-12 DIAGNOSIS — K8020 Calculus of gallbladder without cholecystitis without obstruction: Secondary | ICD-10-CM | POA: Diagnosis not present

## 2014-07-12 DIAGNOSIS — Z419 Encounter for procedure for purposes other than remedying health state, unspecified: Secondary | ICD-10-CM

## 2014-07-12 DIAGNOSIS — K810 Acute cholecystitis: Secondary | ICD-10-CM | POA: Diagnosis present

## 2014-07-12 DIAGNOSIS — K8050 Calculus of bile duct without cholangitis or cholecystitis without obstruction: Secondary | ICD-10-CM | POA: Diagnosis not present

## 2014-07-12 DIAGNOSIS — R109 Unspecified abdominal pain: Secondary | ICD-10-CM | POA: Diagnosis not present

## 2014-07-12 HISTORY — DX: Calculus of gallbladder with chronic cholecystitis without obstruction: K80.10

## 2014-07-12 LAB — CBC WITH DIFFERENTIAL/PLATELET
Basophils Absolute: 0 10*3/uL (ref 0.0–0.1)
Basophils Relative: 0 % (ref 0–1)
Eosinophils Absolute: 0 10*3/uL (ref 0.0–0.7)
Eosinophils Relative: 0 % (ref 0–5)
HEMATOCRIT: 42.4 % (ref 36.0–46.0)
Hemoglobin: 14.1 g/dL (ref 12.0–15.0)
LYMPHS PCT: 13 % (ref 12–46)
Lymphs Abs: 1.6 10*3/uL (ref 0.7–4.0)
MCH: 31.3 pg (ref 26.0–34.0)
MCHC: 33.3 g/dL (ref 30.0–36.0)
MCV: 94 fL (ref 78.0–100.0)
MONO ABS: 0.9 10*3/uL (ref 0.1–1.0)
Monocytes Relative: 8 % (ref 3–12)
NEUTROS ABS: 9.9 10*3/uL — AB (ref 1.7–7.7)
Neutrophils Relative %: 79 % — ABNORMAL HIGH (ref 43–77)
Platelets: 267 10*3/uL (ref 150–400)
RBC: 4.51 MIL/uL (ref 3.87–5.11)
RDW: 12.7 % (ref 11.5–15.5)
WBC: 12.4 10*3/uL — ABNORMAL HIGH (ref 4.0–10.5)

## 2014-07-12 LAB — COMPREHENSIVE METABOLIC PANEL
ALBUMIN: 4 g/dL (ref 3.5–5.2)
ALT: 20 U/L (ref 0–35)
AST: 17 U/L (ref 0–37)
Alkaline Phosphatase: 91 U/L (ref 39–117)
Anion gap: 9 (ref 5–15)
BUN: 18 mg/dL (ref 6–23)
CALCIUM: 9 mg/dL (ref 8.4–10.5)
CHLORIDE: 101 mmol/L (ref 96–112)
CO2: 26 mmol/L (ref 19–32)
Creatinine, Ser: 0.65 mg/dL (ref 0.50–1.10)
GFR calc Af Amer: >90 mL/min (ref >90)
GFR calc non Af Amer: >90 mL/min (ref >90)
Glucose, Bld: 109 mg/dL — ABNORMAL HIGH (ref 70–99)
Potassium: 3.7 mmol/L (ref 3.5–5.1)
Sodium: 136 mmol/L (ref 135–145)
Total Bilirubin: 0.4 mg/dL (ref 0.3–1.2)
Total Protein: 8.4 g/dL — ABNORMAL HIGH (ref 6.0–8.3)

## 2014-07-12 LAB — I-STAT TROPONIN, ED: Troponin i, poc: 0 ng/mL (ref 0.00–0.08)

## 2014-07-12 LAB — LIPASE, BLOOD: LIPASE: 20 U/L (ref 11–59)

## 2014-07-12 MED ORDER — ONDANSETRON HCL 4 MG/2ML IJ SOLN: 4.0000 mg | Freq: Four times a day (QID) | INTRAMUSCULAR | Status: DC | PRN

## 2014-07-12 MED ORDER — FENTANYL CITRATE 0.05 MG/ML IJ SOLN
50.0000 ug | Freq: Once | INTRAMUSCULAR | Status: AC
  Administered 2014-07-12: 50 ug via INTRAVENOUS
  Filled 2014-07-12: qty 2

## 2014-07-12 MED ORDER — BUDESONIDE-FORMOTEROL FUMARATE 80-4.5 MCG/ACT IN AERO
2.0000 | INHALATION_SPRAY | Freq: Two times a day (BID) | RESPIRATORY_TRACT | Status: DC | PRN
  Filled 2014-07-12: qty 6.9

## 2014-07-12 MED ORDER — ENOXAPARIN SODIUM 40 MG/0.4ML ~~LOC~~ SOLN
40.0000 mg | SUBCUTANEOUS | Status: DC
  Administered 2014-07-12: 40 mg via SUBCUTANEOUS
  Filled 2014-07-12 (×2): qty 0.4

## 2014-07-12 MED ORDER — HYDROMORPHONE HCL 1 MG/ML IJ SOLN
1.0000 mg | INTRAMUSCULAR | Status: DC | PRN
  Administered 2014-07-12 – 2014-07-13 (×3): 1 mg via INTRAVENOUS
  Filled 2014-07-12 (×3): qty 1

## 2014-07-12 MED ORDER — CHLORHEXIDINE GLUCONATE 0.12 % MT SOLN
15.0000 mL | Freq: Two times a day (BID) | OROMUCOSAL | Status: DC
  Administered 2014-07-13 (×2): 15 mL via OROMUCOSAL
  Filled 2014-07-12 (×2): qty 15

## 2014-07-12 MED ORDER — POTASSIUM CHLORIDE IN NACL 20-0.9 MEQ/L-% IV SOLN
INTRAVENOUS | Status: DC
  Administered 2014-07-12 – 2014-07-13 (×2): 100 mL/h via INTRAVENOUS
  Filled 2014-07-12 (×3): qty 1000

## 2014-07-12 MED ORDER — CETYLPYRIDINIUM CHLORIDE 0.05 % MT LIQD
7.0000 mL | Freq: Two times a day (BID) | OROMUCOSAL | Status: DC
  Administered 2014-07-13: 7 mL via OROMUCOSAL

## 2014-07-12 MED ORDER — PRAVASTATIN SODIUM 20 MG PO TABS
20.0000 mg | ORAL_TABLET | Freq: Every day | ORAL | Status: DC
  Administered 2014-07-12: 20 mg via ORAL
  Filled 2014-07-12 (×2): qty 1

## 2014-07-12 MED ORDER — LEVALBUTEROL TARTRATE 45 MCG/ACT IN AERO: 1.0000 | INHALATION_SPRAY | RESPIRATORY_TRACT | Status: DC | PRN

## 2014-07-12 MED ORDER — VERAPAMIL HCL 120 MG PO TABS
120.0000 mg | ORAL_TABLET | Freq: Every day | ORAL | Status: DC
  Administered 2014-07-12 – 2014-07-17 (×5): 120 mg via ORAL
  Filled 2014-07-12 (×6): qty 1

## 2014-07-12 MED ORDER — NAPHAZOLINE HCL 0.1 % OP SOLN
1.0000 [drp] | Freq: Four times a day (QID) | OPHTHALMIC | Status: DC | PRN
  Filled 2014-07-12: qty 15

## 2014-07-12 MED ORDER — OXYCODONE HCL 5 MG PO TABS
5.0000 mg | ORAL_TABLET | ORAL | Status: DC | PRN
  Administered 2014-07-14: 5 mg via ORAL
  Filled 2014-07-12: qty 1

## 2014-07-12 MED ORDER — PIPERACILLIN-TAZOBACTAM 3.375 G IVPB
3.3750 g | Freq: Three times a day (TID) | INTRAVENOUS | Status: DC
  Administered 2014-07-12 – 2014-07-16 (×12): 3.375 g via INTRAVENOUS
  Filled 2014-07-12 (×13): qty 50

## 2014-07-12 MED ORDER — DIVALPROEX SODIUM 250 MG PO DR TAB
250.0000 mg | DELAYED_RELEASE_TABLET | Freq: Three times a day (TID) | ORAL | Status: DC
  Administered 2014-07-12 – 2014-07-17 (×12): 250 mg via ORAL
  Filled 2014-07-12 (×16): qty 1

## 2014-07-12 MED ORDER — ALBUTEROL SULFATE (2.5 MG/3ML) 0.083% IN NEBU
2.5000 mg | INHALATION_SOLUTION | RESPIRATORY_TRACT | Status: DC | PRN
  Administered 2014-07-16 – 2014-07-17 (×4): 2.5 mg via RESPIRATORY_TRACT
  Filled 2014-07-12 (×4): qty 3

## 2014-07-12 MED ORDER — ONDANSETRON 8 MG PO TBDP
8.0000 mg | ORAL_TABLET | Freq: Once | ORAL | Status: AC
  Administered 2014-07-12: 8 mg via ORAL
  Filled 2014-07-12: qty 1

## 2014-07-12 NOTE — H&P (Signed)
Patty [name] is an 49 y.o. female.   Chief Complaint: Right upper quadrant abdominal pain HPI: This is a 49 year old female who presents with a 2 day history of right upper quadrant abdominal pain, nausea, and vomiting. She was seen at [facility] yesterday. An ultrasound showed cholelithiasis with a stone in the gallbladder neck. Her white blood count and liver function tests were normal. Her lipase was elevated. She was sent home. Because of continued abdominal pain today, her primary care physician told her to go to [facility]. She describes the pain as sharp, intermittent, and in the right upper quadrant. She reports having intermittent similar attacks in the past. It does not refer any where else. Her nausea and vomiting have improved. She has a history of having have a CVA in the 1990s and has chronic slurred speech as well as right upper and lower extremity weakness. She denies chest pain or shortness of breath. Bowel movements have been normal.  Past Medical History  Diagnosis Date  . Stroke 1990    chronic right hemiparesis  . Asthma   . Hyperlipidemia   . HTN (hypertension)   . Depression   . Seizures     remote  . DDD (degenerative disc disease), lumbar     Past Surgical History  Procedure Laterality Date  . Partial hysterectomy    . Cesarean section    . Colonoscopy  12/24/2011    Procedure: COLONOSCOPY;  Surgeon: Danie Binder, MD;  [facility]: [facility];  Service: Endoscopy;  Laterality: N/A;  2:00    Family History  Problem Relation Age of Onset  . Colon cancer Neg Hx   . Liver disease Neg Hx    Social History:  reports that she has never smoked. She does not have any smokeless tobacco history on file. She reports that she drinks alcohol. She reports that she does not use illicit drugs.  Allergies: No Known Allergies  Medications Prior to Admission  Medication Sig Dispense Refill  . budesonide-formoterol (SYMBICORT) 80-4.5 MCG/ACT inhaler Inhale  2 puffs into the lungs every 12 (twelve) hours as needed. Shortness of breath    . divalproex (DEPAKOTE) 250 MG DR tablet Take 250 mg by mouth 3 (three) times daily.    [name] levalbuterol (XOPENEX HFA) 45 MCG/ACT inhaler Inhale 1-2 puffs into the lungs every 4 (four) hours as needed. Shortness of breath    . oxyCODONE-acetaminophen (PERCOCET/ROXICET) 5-325 MG per tablet Take 1 tablet by mouth every 6 (six) hours as needed for severe pain. 15 tablet 0  . pravastatin (PRAVACHOL) 20 MG tablet Take 20 mg by mouth at bedtime.     [name] tetrahydrozoline (EYE DROPS) 0.05 % ophthalmic solution Place 1 drop into both eyes daily as needed. Dry Eyes    . verapamil (CALAN) 120 MG tablet Take 1 tablet by mouth daily.    . ondansetron (ZOFRAN) 4 MG tablet Take 1 tablet (4 mg total) by mouth every 8 (eight) hours as needed for nausea or vomiting. (Patient not taking: Reported on 07/12/2014) 20 tablet 0    Results for orders placed or performed during the hospital encounter of 07/12/14 (from the past 48 hour(s))  CBC with Differential     Status: Abnormal   Collection Time: 07/12/14  3:12 PM  Result Value Ref Range   WBC 12.4 (H) 4.0 - 10.5 K/uL   RBC 4.51 3.87 - 5.11 MIL/uL   Hemoglobin 14.1 12.0 - 15.0 g/dL   HCT 42.4 36.0 -  46.0 %   MCV 94.0 78.0 - 100.0 fL   MCH 31.3 26.0 - 34.0 pg   MCHC 33.3 30.0 - 36.0 g/dL   RDW 12.7 11.5 - 15.5 %   Platelets 267 150 - 400 K/uL   Neutrophils Relative % 79 (H) 43 - 77 %   Neutro Abs 9.9 (H) 1.7 - 7.7 K/uL   Lymphocytes Relative 13 12 - 46 %   Lymphs Abs 1.6 0.7 - 4.0 K/uL   Monocytes Relative 8 3 - 12 %   Monocytes Absolute 0.9 0.1 - 1.0 K/uL   Eosinophils Relative 0 0 - 5 %   Eosinophils Absolute 0.0 0.0 - 0.7 K/uL   Basophils Relative 0 0 - 1 %   Basophils Absolute 0.0 0.0 - 0.1 K/uL  Comprehensive metabolic panel     Status: Abnormal   Collection Time: 07/12/14  3:12 PM  Result Value Ref Range   Sodium 136 135 - 145 mmol/L   Potassium 3.7 3.5 - 5.1 mmol/L    Chloride 101 96 - 112 mmol/L   CO2 26 19 - 32 mmol/L   Glucose, Bld 109 (H) 70 - 99 mg/dL   BUN 18 6 - 23 mg/dL   Creatinine, Ser 0.65 0.50 - 1.10 mg/dL   Calcium 9.0 8.4 - 10.5 mg/dL   Total Protein 8.4 (H) 6.0 - 8.3 g/dL   Albumin 4.0 3.5 - 5.2 g/dL   AST 17 0 - 37 U/L   ALT 20 0 - 35 U/L   Alkaline Phosphatase 91 39 - 117 U/L   Total Bilirubin 0.4 0.3 - 1.2 mg/dL   GFR calc non Af Amer >90 >90 mL/min   GFR calc Af Amer >90 >90 mL/min    Comment: (NOTE) The eGFR has been calculated using the CKD EPI equation. This calculation has not been validated in all clinical situations. eGFR's persistently <90 mL/min signify possible Chronic Kidney Disease.    Anion gap 9 5 - 15  Lipase, blood     Status: None   Collection Time: 07/12/14  3:12 PM  Result Value Ref Range   Lipase 20 11 - 59 U/L  I-stat troponin, ED (only if pt is 50 y.o. or older & pain is above umbilicus) - do not order at [facility]     Status: None   Collection Time: 07/12/14  3:23 PM  Result Value Ref Range   Troponin i, poc 0.00 0.00 - 0.08 ng/mL   Comment 3            Comment: Due to the release kinetics of cTnI, a negative result within the first hours of the onset of symptoms does not rule out myocardial infarction with certainty. If myocardial infarction is still suspected, repeat the test at appropriate intervals.    Dg Chest Portable 1 View  07/12/2014   CLINICAL DATA:  Right side abdominal pain today.  Vomiting.  EXAM: PORTABLE CHEST - 1 VIEW  COMPARISON:  PA and lateral chest 04/29/2013.  FINDINGS: The lungs are clear. Heart size is upper normal. No pneumothorax or pleural effusion.  IMPRESSION: No acute disease.   Electronically Signed   By: Inge Rise M.D.   On: 07/12/2014 18:45   US Abdomen Limited Ruq  07/11/2014   CLINICAL DATA:  Right upper quadrant pain for one night.  EXAM: US ABDOMEN LIMITED - RIGHT UPPER QUADRANT  COMPARISON:  None.  FINDINGS: Gallbladder:  The 2.7 cm stone is identified in the  neck of the  gallbladder. There is no gallbladder wall thickening or pericholecystic fluid. Sonographer reports negative Murphy's sign.  Common bile duct:  Diameter: 0.5 cm  Liver:  No focal lesion identified. Within normal limits in parenchymal echogenicity.  IMPRESSION: 2.7 cm stone in the neck of the gallbladder. Negative for evidence of cholecystitis.  Right upper Please select the correct template based on the type of Limited Abdominal US exam - i.e. RUQ, Ascites, Appendix, Pylorus, etc   Electronically Signed   By: Inge Rise M.D.   On: 07/11/2014 07:44    Review of Systems  All other systems reviewed and are negative.   Blood pressure 126/85, pulse 84, temperature 98 F (36.7 C), temperature source Oral, resp. rate 18, height _0  (1.575 m), weight 99.791 kg (220 lb), SpO2 100 %. Physical Exam  Constitutional: She appears well-developed and well-nourished. No distress.  HENT:  Head: Normocephalic and atraumatic.  Right Ear: External ear normal.  Left Ear: External ear normal.  Nose: Nose normal.  Mouth/Throat: Oropharynx is clear and moist. No oropharyngeal exudate.  Eyes: Conjunctivae are normal. Pupils are equal, round, and reactive to light.  Neck: Normal range of motion. No tracheal deviation present.  Cardiovascular: Normal rate, regular rhythm, normal heart sounds and intact distal pulses.   No murmur heard. Respiratory: Effort normal and breath sounds normal. No respiratory distress.  GI: Soft. There is tenderness. There is guarding.  There is tenderness with guarding in the right upper quadrant. The rest of the abdomen is soft and nontender  Musculoskeletal: Normal range of motion. She exhibits no edema or tenderness.  Lymphadenopathy:    She has no cervical adenopathy.  Neurological: She is alert.  Slurred speech. Weakness in the right upper and lower extremity  Skin: Skin is warm and dry. No erythema.  Psychiatric: Her behavior is normal. Judgment normal.      Assessment/Plan Acute cholecystitis with cholelithiasis  Plan will be to admit the patient to the hospital for IV antibiotics and eventual laparoscopic cholecystectomy with possible cholangiogram. I discussed the diagnosis with her in detail. She understands and agrees.  Esmay Amspacher A 07/12/2014, 8:40 PM

## 2014-07-12 NOTE — ED Provider Notes (Signed)
CSN: [id number]     Arrival date & time 07/12/14  1335 History   First MD Initiated Contact with Patient 07/12/14 1719     Chief Complaint  Patient presents with  . Abdominal Pain  . Nausea  . Emesis     (Consider location/radiation/quality/duration/timing/severity/associated sxs/prior Treatment) Patient is a 49 y.o. female presenting with abdominal pain and vomiting. The history is provided by the patient.  Abdominal Pain Associated symptoms: fatigue, nausea and vomiting   Associated symptoms: no chest pain, no dysuria and no shortness of breath   Emesis Associated symptoms: abdominal pain   Associated symptoms: no headaches    patient presents with biliary colic. Was seen at [facility] yesterday morning. Diagnosis of biliary colic with a slightly elevated lipase came down and a 2.7 cm gallbladder stone in the neck. Was reportedly doing better at the time but the pain returned with nausea and vomiting. No fevers. Reportedly called Dr. Lovell Sheehan and they stated they could not see him today. They then called her primary care doctor who told her to come down to the ER. Her primary care is Dr. Loleta Chance. She states she's continued to have some nausea. The pain continues on the right side or abdomen. No diarrhea. No fevers.  Past Medical History  Diagnosis Date  . Stroke 1990    chronic right hemiparesis  . Asthma   . Hyperlipidemia   . HTN (hypertension)   . Depression   . Seizures     remote  . DDD (degenerative disc disease), lumbar    Past Surgical History  Procedure Laterality Date  . Partial hysterectomy    . Cesarean section    . Colonoscopy  12/24/2011    Procedure: COLONOSCOPY;  Surgeon: West Bali, MD;  Location: [facility];  Service: Endoscopy;  Laterality: N/A;  2:00   Family History  Problem Relation Age of Onset  . Colon cancer Neg Hx   . Liver disease Neg Hx    History  Substance Use Topics  . Smoking status: Never Smoker   . Smokeless tobacco: Not  on file  . Alcohol Use: Yes     Comment: couple of drinks on weekends   OB History    No data available     Review of Systems  Constitutional: Positive for appetite change and fatigue.  HENT: Negative for facial swelling.   Respiratory: Negative for chest tightness and shortness of breath.   Cardiovascular: Negative for chest pain.  Gastrointestinal: Positive for nausea, vomiting and abdominal pain.  Genitourinary: Negative for dysuria and frequency.  Skin: Negative for rash.  Neurological: Negative for headaches.  Hematological: Negative for adenopathy.  Psychiatric/Behavioral: Negative for confusion.      Allergies  Review of patient's allergies indicates no known allergies.  Home Medications   Prior to Admission medications   Medication Sig Start Date End Date Taking? Authorizing Provider  budesonide-formoterol (SYMBICORT) 80-4.5 MCG/ACT inhaler Inhale 2 puffs into the lungs every 12 (twelve) hours as needed. Shortness of breath   Yes Historical Provider, MD  divalproex (DEPAKOTE) 250 MG DR tablet Take 250 mg by mouth 3 (three) times daily.   Yes Historical Provider, MD  levalbuterol [facility] HFA) 45 MCG/ACT inhaler Inhale 1-2 puffs into the lungs every 4 (four) hours as needed. Shortness of breath   Yes Historical Provider, MD  oxyCODONE-acetaminophen (PERCOCET/ROXICET) 5-325 MG per tablet Take 1 tablet by mouth every 6 (six) hours as needed for severe pain. 07/11/14  Yes Mayer Masker  Horton, MD  pravastatin (PRAVACHOL) 20 MG tablet Take 20 mg by mouth at bedtime.    Yes Historical Provider, MD  tetrahydrozoline (EYE DROPS) 0.05 % ophthalmic solution Place 1 drop into both eyes daily as needed. Dry Eyes   Yes Historical Provider, MD  verapamil (CALAN) 120 MG tablet Take 1 tablet by mouth daily. 09/23/13  Yes Historical Provider, MD  ondansetron (ZOFRAN) 4 MG tablet Take 1 tablet (4 mg total) by mouth every 8 (eight) hours as needed for nausea or vomiting. Patient not taking:  Reported on 07/12/2014 07/11/14   Shon Batonourtney F Horton, MD   BP 109/61 mmHg  Pulse 73  Temp(Src) 99 F (37.2 C) (Oral)  Resp 18  Ht 5\' 2"  (1.575 m)  Wt 220 lb (99.791 kg)  BMI 40.23 kg/m2  SpO2 99% Physical Exam  Constitutional: She is oriented to person, place, and time. She appears well-developed.  HENT:  Head: Normocephalic and atraumatic.  Cardiovascular: Normal rate and regular rhythm.   Pulmonary/Chest: Effort normal.  Abdominal: There is tenderness.  Patient is obese with moderate right upper quadrant tenderness. No hernias palpated.  Musculoskeletal: Normal range of motion.  Neurological: She is alert and oriented to person, place, and time.  Skin: Skin is warm.    ED Course  Procedures (including critical care time) Labs Review Labs Reviewed  CBC WITH DIFFERENTIAL/PLATELET - Abnormal; Notable for the following:    WBC 12.4 (*)    Neutrophils Relative % 79 (*)    Neutro Abs 9.9 (*)    All other components within normal limits  COMPREHENSIVE METABOLIC PANEL - Abnormal; Notable for the following:    Glucose, Bld 109 (*)    Total Protein 8.4 (*)    All other components within normal limits  COMPREHENSIVE METABOLIC PANEL - Abnormal; Notable for the following:    Glucose, Bld 114 (*)    Calcium 8.2 (*)    Albumin 3.2 (*)    GFR calc non Af Amer 83 (*)    All other components within normal limits  CBC - Abnormal; Notable for the following:    WBC 11.6 (*)    All other components within normal limits  SURGICAL PCR SCREEN  LIPASE, BLOOD  I-STAT TROPOININ, ED    Imaging Review Dg Chest 2 View  07/12/2014   CLINICAL DATA:  Weakness, shortness of breath, epigastric pain. Preop for cholecystectomy.  EXAM: CHEST  2 VIEW  COMPARISON:  Single frontal view earlier this day.  FINDINGS: Lung volumes are low. The cardiomediastinal contours are unchanged, the heart remains at the upper limits of normal in size. Minimal bibasilar atelectasis. Pulmonary vasculature is normal. No  consolidation, pleural effusion, or pneumothorax. No acute osseous abnormalities are seen.  IMPRESSION: Minimal bibasilar atelectasis.   Electronically Signed   By: Rubye OaksMelanie  Ehinger M.D.   On: 07/12/2014 21:07   Dg Chest Portable 1 View  07/12/2014   CLINICAL DATA:  Right side abdominal pain today.  Vomiting.  EXAM: PORTABLE CHEST - 1 VIEW  COMPARISON:  PA and lateral chest 04/29/2013.  FINDINGS: The lungs are clear. Heart size is upper normal. No pneumothorax or pleural effusion.  IMPRESSION: No acute disease.   Electronically Signed   By: Drusilla Kannerhomas  Dalessio M.D.   On: 07/12/2014 18:45     EKG Interpretation None      MDM   Final diagnoses:  Preop examination  Surgery, other elective  biliary colic  Patient with biliary colic. [facility] Lipase is  improved. Discussed with general surgery who will admit the patient.    Benjiman Core, MD 07/13/14 [phone]

## 2014-07-12 NOTE — ED Notes (Signed)
Pain started to right side yesterday. [name] and diagnosed with symptomatic cholelithiasis, was told to follow up with general surgery. States pain increasing upon waking up this morning, attempted to call general surgery and said "the doctor was busy." She called her PCP, who told her to come here. Pt states she's thrown up around 5-6 times this morning, denies diarrhea.

## 2014-07-13 ENCOUNTER — Observation Stay (HOSPITAL_COMMUNITY): Payer: Medicare Other

## 2014-07-13 ENCOUNTER — Encounter (HOSPITAL_COMMUNITY): Admission: EM | Disposition: A | Discharge status: Home / Self Care | Payer: Self-pay | Source: Home / Self Care

## 2014-07-13 ENCOUNTER — Observation Stay (HOSPITAL_COMMUNITY): Payer: Medicare Other | Admitting: Anesthesiology

## 2014-07-13 ENCOUNTER — Encounter (HOSPITAL_COMMUNITY): Payer: Self-pay | Admitting: Anesthesiology

## 2014-07-13 DIAGNOSIS — K8000 Calculus of gallbladder with acute cholecystitis without obstruction: Secondary | ICD-10-CM | POA: Diagnosis present

## 2014-07-13 DIAGNOSIS — K810 Acute cholecystitis: Secondary | ICD-10-CM | POA: Diagnosis not present

## 2014-07-13 DIAGNOSIS — R569 Unspecified convulsions: Secondary | ICD-10-CM | POA: Diagnosis not present

## 2014-07-13 DIAGNOSIS — I69951 Hemiplegia and hemiparesis following unspecified cerebrovascular disease affecting right dominant side: Secondary | ICD-10-CM | POA: Diagnosis not present

## 2014-07-13 DIAGNOSIS — I10 Essential (primary) hypertension: Secondary | ICD-10-CM | POA: Diagnosis not present

## 2014-07-13 HISTORY — DX: Calculus of gallbladder with acute cholecystitis without obstruction: K80.00

## 2014-07-13 HISTORY — PX: CHOLECYSTECTOMY: SHX55

## 2014-07-13 LAB — COMPREHENSIVE METABOLIC PANEL
ALBUMIN: 3.2 g/dL — AB (ref 3.5–5.2)
ALT: 16 U/L (ref 0–35)
AST: 12 U/L (ref 0–37)
Alkaline Phosphatase: 72 U/L (ref 39–117)
Anion gap: 7 (ref 5–15)
BILIRUBIN TOTAL: 0.6 mg/dL (ref 0.3–1.2)
BUN: 15 mg/dL (ref 6–23)
CHLORIDE: 104 mmol/L (ref 96–112)
CO2: 27 mmol/L (ref 19–32)
CREATININE: 0.82 mg/dL (ref 0.50–1.10)
Calcium: 8.2 mg/dL — ABNORMAL LOW (ref 8.4–10.5)
GFR calc Af Amer: >90 mL/min (ref >90)
GFR calc non Af Amer: 83 mL/min — ABNORMAL LOW (ref >90)
GLUCOSE: 114 mg/dL — AB (ref 70–99)
Potassium: 3.7 mmol/L (ref 3.5–5.1)
Sodium: 138 mmol/L (ref 135–145)
Total Protein: 7.2 g/dL (ref 6.0–8.3)

## 2014-07-13 LAB — CBC
HCT: 38.3 % (ref 36.0–46.0)
HEMATOCRIT: 39.8 % (ref 36.0–46.0)
HEMOGLOBIN: 12.5 g/dL (ref 12.0–15.0)
Hemoglobin: 12.1 g/dL (ref 12.0–15.0)
MCH: 30.3 pg (ref 26.0–34.0)
MCH: 30.6 pg (ref 26.0–34.0)
MCHC: 31.6 g/dL (ref 30.0–36.0)
MCHC: 31.4 g/dL (ref 30.0–36.0)
MCV: 96 fL (ref 78.0–100.0)
MCV: 97.3 fL (ref 78.0–100.0)
PLATELETS: 320 10*3/uL (ref 150–400)
Platelets: 290 10*3/uL (ref 150–400)
RBC: 3.99 MIL/uL (ref 3.87–5.11)
RBC: 4.09 MIL/uL (ref 3.87–5.11)
RDW: 13 % (ref 11.5–15.5)
RDW: 13.2 % (ref 11.5–15.5)
WBC: 11.6 10*3/uL — AB (ref 4.0–10.5)
WBC: 18.8 10*3/uL — ABNORMAL HIGH (ref 4.0–10.5)

## 2014-07-13 LAB — CREATININE, SERUM
Creatinine, Ser: 0.81 mg/dL (ref 0.50–1.10)
GFR calc Af Amer: >90 mL/min (ref >90)
GFR calc non Af Amer: 84 mL/min — ABNORMAL LOW (ref >90)

## 2014-07-13 LAB — SURGICAL PCR SCREEN
MRSA, PCR: NEGATIVE
Staphylococcus aureus: NEGATIVE

## 2014-07-13 SURGERY — LAPAROSCOPIC CHOLECYSTECTOMY WITH INTRAOPERATIVE CHOLANGIOGRAM
Anesthesia: General

## 2014-07-13 MED ORDER — 0.9 % SODIUM CHLORIDE (POUR BTL) OPTIME
TOPICAL | Status: DC | PRN
  Administered 2014-07-13: 2000 mL

## 2014-07-13 MED ORDER — LIDOCAINE HCL (CARDIAC) 20 MG/ML IV SOLN
INTRAVENOUS | Status: DC | PRN
  Administered 2014-07-13: 50 mg via INTRAVENOUS

## 2014-07-13 MED ORDER — ACETAMINOPHEN 325 MG PO TABS: 650.0000 mg | ORAL_TABLET | ORAL | Status: DC | PRN

## 2014-07-13 MED ORDER — HYDROMORPHONE HCL 1 MG/ML IJ SOLN
INTRAMUSCULAR | Status: DC | PRN
  Administered 2014-07-13 (×2): 1 mg via INTRAVENOUS

## 2014-07-13 MED ORDER — LIDOCAINE HCL (CARDIAC) 20 MG/ML IV SOLN
INTRAVENOUS | Status: AC
  Filled 2014-07-13: qty 5

## 2014-07-13 MED ORDER — HYDRALAZINE HCL 20 MG/ML IJ SOLN
INTRAMUSCULAR | Status: AC
  Filled 2014-07-13: qty 1

## 2014-07-13 MED ORDER — GLYCOPYRROLATE 0.2 MG/ML IJ SOLN
INTRAMUSCULAR | Status: DC | PRN
  Administered 2014-07-13: 0.6 mg via INTRAVENOUS

## 2014-07-13 MED ORDER — ENOXAPARIN SODIUM 40 MG/0.4ML ~~LOC~~ SOLN
40.0000 mg | SUBCUTANEOUS | Status: DC
  Administered 2014-07-14 – 2014-07-16 (×3): 40 mg via SUBCUTANEOUS
  Filled 2014-07-13 (×4): qty 0.4

## 2014-07-13 MED ORDER — LACTATED RINGERS IR SOLN
Status: DC | PRN
  Administered 2014-07-13: 1000 mL

## 2014-07-13 MED ORDER — NEOSTIGMINE METHYLSULFATE 10 MG/10ML IV SOLN
INTRAVENOUS | Status: AC
  Filled 2014-07-13: qty 1

## 2014-07-13 MED ORDER — DEXAMETHASONE SODIUM PHOSPHATE 10 MG/ML IJ SOLN
INTRAMUSCULAR | Status: AC
  Filled 2014-07-13: qty 1

## 2014-07-13 MED ORDER — HYDROMORPHONE HCL 2 MG/ML IJ SOLN
INTRAMUSCULAR | Status: AC
  Filled 2014-07-13: qty 1

## 2014-07-13 MED ORDER — CISATRACURIUM BESYLATE (PF) 10 MG/5ML IV SOLN
INTRAVENOUS | Status: DC | PRN
  Administered 2014-07-13 (×3): 2 mg via INTRAVENOUS
  Administered 2014-07-13: 4 mg via INTRAVENOUS

## 2014-07-13 MED ORDER — HYDROCODONE-ACETAMINOPHEN 5-325 MG PO TABS
1.0000 | ORAL_TABLET | ORAL | Status: DC | PRN
  Administered 2014-07-15 (×2): 2 via ORAL
  Administered 2014-07-15 – 2014-07-16 (×3): 1 via ORAL
  Filled 2014-07-13 (×2): qty 1
  Filled 2014-07-13 (×2): qty 2
  Filled 2014-07-13: qty 1

## 2014-07-13 MED ORDER — ONDANSETRON HCL 4 MG/2ML IJ SOLN
INTRAMUSCULAR | Status: AC
  Filled 2014-07-13: qty 2

## 2014-07-13 MED ORDER — PROPOFOL 10 MG/ML IV BOLUS
INTRAVENOUS | Status: AC
  Filled 2014-07-13: qty 20

## 2014-07-13 MED ORDER — ALBUTEROL SULFATE HFA 108 (90 BASE) MCG/ACT IN AERS
INHALATION_SPRAY | RESPIRATORY_TRACT | Status: DC | PRN
  Administered 2014-07-13 (×2): 2 via RESPIRATORY_TRACT

## 2014-07-13 MED ORDER — IOHEXOL 300 MG/ML  SOLN
INTRAMUSCULAR | Status: DC | PRN
  Administered 2014-07-13: 50 mL via INTRAVENOUS

## 2014-07-13 MED ORDER — ONDANSETRON HCL 4 MG/2ML IJ SOLN
4.0000 mg | Freq: Four times a day (QID) | INTRAMUSCULAR | Status: DC | PRN
  Administered 2014-07-14: 4 mg via INTRAVENOUS
  Filled 2014-07-13: qty 2

## 2014-07-13 MED ORDER — LACTATED RINGERS IV SOLN
INTRAVENOUS | Status: DC | PRN
Start: 2014-07-13 — End: 2014-07-13
  Administered 2014-07-13 (×3): via INTRAVENOUS

## 2014-07-13 MED ORDER — HYDROMORPHONE HCL 1 MG/ML IJ SOLN
INTRAMUSCULAR | Status: AC
  Filled 2014-07-13: qty 1

## 2014-07-13 MED ORDER — PROPOFOL 10 MG/ML IV BOLUS
INTRAVENOUS | Status: DC | PRN
  Administered 2014-07-13: 180 mg via INTRAVENOUS

## 2014-07-13 MED ORDER — ONDANSETRON HCL 4 MG/2ML IJ SOLN
INTRAMUSCULAR | Status: DC | PRN
  Administered 2014-07-13: 4 mg via INTRAVENOUS

## 2014-07-13 MED ORDER — HYDROMORPHONE HCL 1 MG/ML IJ SOLN
1.0000 mg | INTRAMUSCULAR | Status: DC | PRN
  Administered 2014-07-13 – 2014-07-14 (×3): 1 mg via INTRAVENOUS
  Filled 2014-07-13 (×3): qty 1

## 2014-07-13 MED ORDER — SUFENTANIL CITRATE 50 MCG/ML IV SOLN
INTRAVENOUS | Status: DC | PRN
  Administered 2014-07-13 (×5): 10 ug via INTRAVENOUS

## 2014-07-13 MED ORDER — ONDANSETRON HCL 4 MG PO TABS
4.0000 mg | ORAL_TABLET | Freq: Four times a day (QID) | ORAL | Status: DC | PRN
  Administered 2014-07-16: 4 mg via ORAL
  Filled 2014-07-13: qty 1

## 2014-07-13 MED ORDER — HYDROMORPHONE HCL 1 MG/ML IJ SOLN
0.2500 mg | INTRAMUSCULAR | Status: DC | PRN
  Administered 2014-07-13 – 2014-07-14 (×3): 0.5 mg via INTRAVENOUS
  Filled 2014-07-13: qty 1

## 2014-07-13 MED ORDER — SUCCINYLCHOLINE CHLORIDE 20 MG/ML IJ SOLN
INTRAMUSCULAR | Status: DC | PRN
  Administered 2014-07-13: 100 mg via INTRAVENOUS
  Administered 2014-07-13: 4 mg via INTRAVENOUS

## 2014-07-13 MED ORDER — KCL IN DEXTROSE-NACL 20-5-0.45 MEQ/L-%-% IV SOLN
INTRAVENOUS | Status: DC
  Administered 2014-07-13 – 2014-07-16 (×3): 75 mL/h via INTRAVENOUS
  Filled 2014-07-13 (×5): qty 1000

## 2014-07-13 MED ORDER — NEOSTIGMINE METHYLSULFATE 10 MG/10ML IV SOLN
INTRAVENOUS | Status: DC | PRN
Start: 2014-07-13 — End: 2014-07-13
  Administered 2014-07-13: 4 mg via INTRAVENOUS

## 2014-07-13 MED ORDER — BUPIVACAINE-EPINEPHRINE 0.5% -1:200000 IJ SOLN
INTRAMUSCULAR | Status: AC
  Filled 2014-07-13: qty 1

## 2014-07-13 MED ORDER — GLYCOPYRROLATE 0.2 MG/ML IJ SOLN
INTRAMUSCULAR | Status: AC
  Filled 2014-07-13: qty 3

## 2014-07-13 MED ORDER — ONDANSETRON HCL 4 MG/2ML IJ SOLN: 4.0000 mg | Freq: Once | INTRAMUSCULAR | Status: AC | PRN

## 2014-07-13 MED ORDER — BUPIVACAINE-EPINEPHRINE 0.5% -1:200000 IJ SOLN
INTRAMUSCULAR | Status: DC | PRN
  Administered 2014-07-13: 50 mL

## 2014-07-13 MED ORDER — CISATRACURIUM BESYLATE 20 MG/10ML IV SOLN
INTRAVENOUS | Status: AC
  Filled 2014-07-13: qty 10

## 2014-07-13 MED ORDER — HYDRALAZINE HCL 20 MG/ML IJ SOLN
2.0000 mg | INTRAMUSCULAR | Status: AC
  Administered 2014-07-13 (×2): 2 mg via INTRAVENOUS

## 2014-07-13 MED ORDER — SUFENTANIL CITRATE 50 MCG/ML IV SOLN
INTRAVENOUS | Status: AC
  Filled 2014-07-13: qty 1

## 2014-07-13 SURGICAL SUPPLY — 53 items
APL SKNCLS STERI-STRIP NONHPOA (GAUZE/BANDAGES/DRESSINGS) ×1
APPLIER CLIP ROT 10 11.4 M/L (STAPLE) ×3
APR CLP MED LRG 11.4X10 (STAPLE) ×1
BAG SPEC RTRVL LRG 6X4 10 (ENDOMECHANICALS) ×1
BENZOIN TINCTURE PRP APPL 2/3 (GAUZE/BANDAGES/DRESSINGS) ×3 IMPLANT
BLADE EXTENDED COATED 6.5IN (ELECTRODE) ×3 IMPLANT
CABLE HIGH FREQUENCY MONO STRZ (ELECTRODE) ×3 IMPLANT
CHLORAPREP W/TINT 26ML (MISCELLANEOUS) ×3 IMPLANT
CLEANER TIP ELECTROSURG 2X2 (MISCELLANEOUS) ×3 IMPLANT
CLIP APPLIE ROT 10 11.4 M/L (STAPLE) ×1 IMPLANT
CLIP LIGATING HEM O LOK PURPLE (MISCELLANEOUS) ×3 IMPLANT
CLOSURE WOUND 1/2 X4 (GAUZE/BANDAGES/DRESSINGS) ×1
COVER MAYO STAND STRL (DRAPES) ×3 IMPLANT
DECANTER SPIKE VIAL GLASS SM (MISCELLANEOUS) ×3 IMPLANT
DRAIN CHANNEL 19F RND (DRAIN) ×2 IMPLANT
DRAPE C-ARM 42X120 X-RAY (DRAPES) ×3 IMPLANT
DRAPE LAPAROSCOPIC ABDOMINAL (DRAPES) ×3 IMPLANT
DRAPE UTILITY XL STRL (DRAPES) ×3 IMPLANT
DRAPE WARM FLUID 44X44 (DRAPE) ×2 IMPLANT
ELECT CAUTERY BLADE 6.4 (BLADE) ×2 IMPLANT
ELECT REM PT RETURN 9FT ADLT (ELECTROSURGICAL) ×3
ELECTRODE REM PT RTRN 9FT ADLT (ELECTROSURGICAL) ×1 IMPLANT
EVACUATOR SILICONE 100CC (DRAIN) ×2 IMPLANT
GAUZE SPONGE 2X2 8PLY STRL LF (GAUZE/BANDAGES/DRESSINGS) ×1 IMPLANT
GAUZE SPONGE 4X4 12PLY STRL (GAUZE/BANDAGES/DRESSINGS) ×2 IMPLANT
GLOVE SURG ORTHO 8.0 STRL STRW (GLOVE) ×3 IMPLANT
GOWN STRL REUS W/TWL XL LVL3 (GOWN DISPOSABLE) ×6 IMPLANT
HEMOSTAT SURGICEL 4X8 (HEMOSTASIS) IMPLANT
KIT BASIN OR (CUSTOM PROCEDURE TRAY) ×3 IMPLANT
NS IRRIG 1000ML POUR BTL (IV SOLUTION) ×3 IMPLANT
PENCIL BUTTON HOLSTER BLD 10FT (ELECTRODE) ×2 IMPLANT
POUCH SPECIMEN RETRIEVAL 10MM (ENDOMECHANICALS) ×3 IMPLANT
SCISSORS LAP 5X35 DISP (ENDOMECHANICALS) ×3 IMPLANT
SET CHOLANGIOGRAPH MIX (MISCELLANEOUS) ×3 IMPLANT
SET IRRIG TUBING LAPAROSCOPIC (IRRIGATION / IRRIGATOR) ×3 IMPLANT
SLEEVE XCEL OPT CAN 5 100 (ENDOMECHANICALS) ×3 IMPLANT
SPONGE GAUZE 2X2 STER 10/PKG (GAUZE/BANDAGES/DRESSINGS) ×2
STAPLER VISISTAT 35W (STAPLE) ×3 IMPLANT
STRIP CLOSURE SKIN 1/2X4 (GAUZE/BANDAGES/DRESSINGS) ×2 IMPLANT
SUT ETHILON 3 0 PS 1 (SUTURE) ×2 IMPLANT
SUT MNCRL AB 4-0 PS2 18 (SUTURE) ×3 IMPLANT
SUT NOVA NAB DX-16 0-1 5-0 T12 (SUTURE) ×3 IMPLANT
SUT PDS AB 1 CTX 36 (SUTURE) ×6 IMPLANT
SUT VIC AB 0 UR5 27 (SUTURE) ×3 IMPLANT
SYR BULB IRRIGATION 50ML (SYRINGE) ×3 IMPLANT
TAPE CLOTH SURG 6X10 WHT LF (GAUZE/BANDAGES/DRESSINGS) ×2 IMPLANT
TOWEL OR 17X26 10 PK STRL BLUE (TOWEL DISPOSABLE) ×3 IMPLANT
TOWEL OR NON WOVEN STRL DISP B (DISPOSABLE) ×3 IMPLANT
TRAY LAPAROSCOPIC (CUSTOM PROCEDURE TRAY) ×3 IMPLANT
TROCAR BLADELESS OPT 5 100 (ENDOMECHANICALS) ×3 IMPLANT
TROCAR XCEL BLUNT TIP 100MML (ENDOMECHANICALS) ×3 IMPLANT
TROCAR XCEL NON-BLD 11X100MML (ENDOMECHANICALS) ×3 IMPLANT
YANKAUER SUCT BULB TIP 10FT TU (MISCELLANEOUS) ×3 IMPLANT

## 2014-07-13 NOTE — Anesthesia Procedure Notes (Signed)
Procedure Name: Intubation Date/Time: 07/13/2014 2:36 PM Performed by: Enriqueta ShutterWILLIFORD, Japleen Tornow D Pre-anesthesia Checklist: Patient identified, Emergency Drugs available, Suction available and Patient being monitored Patient Re-evaluated:Patient Re-evaluated prior to inductionOxygen Delivery Method: Circle System Utilized Preoxygenation: Pre-oxygenation with 100% oxygen Intubation Type: IV induction Ventilation: Mask ventilation without difficulty Laryngoscope Size: Mac and 4 Grade View: Grade II Tube type: Oral Tube size: 7.5 mm Number of attempts: 1 Airway Equipment and Method: Stylet and Oral airway Placement Confirmation: ETT inserted through vocal cords under direct vision,  positive ETCO2 and breath sounds checked- equal and bilateral Secured at: 21 cm Tube secured with: Tape Dental Injury: Teeth and Oropharynx as per pre-operative assessment

## 2014-07-13 NOTE — Anesthesia Preprocedure Evaluation (Signed)
Anesthesia Evaluation  Patient identified by MRN, date of birth, ID band Patient awake    Reviewed: Allergy & Precautions, NPO status , Patient's Chart, lab work & pertinent test results  Airway Mallampati: II  TM Distance: >3 FB     Dental   Pulmonary asthma ,    + decreased breath sounds      Cardiovascular hypertension, Rhythm:Regular Rate:Normal     Neuro/Psych Seizures -,  Depression CVA, Residual Symptoms    GI/Hepatic   Endo/Other    Renal/GU      Musculoskeletal  (+) Arthritis -,   Abdominal   Peds  Hematology   Anesthesia Other Findings   Reproductive/Obstetrics                             Anesthesia Physical Anesthesia Plan  ASA: III  Anesthesia Plan: General   Post-op Pain Management:    Induction: Intravenous  Airway Management Planned: Oral ETT  Additional Equipment:   Intra-op Plan:   Post-operative Plan: Extubation in OR  Informed Consent: I have reviewed the patients History and Physical, chart, labs and discussed the procedure including the risks, benefits and alternatives for the proposed anesthesia with the patient or authorized representative who has indicated his/her understanding and acceptance.     Plan Discussed with: CRNA, Anesthesiologist and Surgeon  Anesthesia Plan Comments:         Anesthesia Quick Evaluation

## 2014-07-13 NOTE — Anesthesia Postprocedure Evaluation (Signed)
  Anesthesia Post-op Note  Patient: Patty OppenheimClara M Yanes  Procedure(s) Performed: Procedure(s): LAPAROSCOPIC CONVERTED TO OPEN  CHOLECYSTECTOMY  (N/A)  Patient Location: PACU  Anesthesia Type:General  Level of Consciousness: awake, oriented, sedated and patient cooperative  Airway and Oxygen Therapy: Patient Spontanous Breathing  Post-op Pain: mild, moderate  Post-op Assessment: Post-op Vital signs reviewed, Patient's Cardiovascular Status Stable, Respiratory Function Stable, Patent Airway, No signs of Nausea or vomiting and Pain level controlled  Post-op Vital Signs: stable  Last Vitals:  Filed Vitals:   07/13/14 1745  BP: 170/108  Pulse: 91  Temp:   Resp: 19    Complications: No apparent anesthesia complications

## 2014-07-13 NOTE — Progress Notes (Signed)
Dr. Katrinka BlazingSmith aware of BP 178/103. Orders given.

## 2014-07-13 NOTE — Progress Notes (Signed)
Subjective: Awaiting surgery.  Still very tender Right side and more in the RUQ.  She reports no seizures for some time.  Ongoing right sided weakness.    Objective: Vital signs in last 24 hours: Temp:  [98 F (36.7 C)-99.1 F (37.3 C)] 99 F (37.2 C) (04/13 0526) Pulse Rate:  [72-85] 73 (04/13 0526) Resp:  [15-18] 18 (04/13 0526) BP: (103-132)/(60-85) 109/61 mmHg (04/13 0526) SpO2:  [77 %-100 %] 99 % (04/13 0526) Weight:  [99.791 kg (220 lb)] 99.791 kg (220 lb) (04/12 1402) Last BM Date: 07/11/14     Intake/Output from previous day: 04/12 0701 - 04/13 0700 In: -  Out: 150 [Urine:150] Intake/Output this shift:    General appearance: alert, cooperative and no distress GI: soft, but very tender.    Lab Results:   Recent Labs  07/12/14 1512 07/13/14 0420  WBC 12.4* 11.6*  HGB 14.1 12.1  HCT 42.4 38.3  PLT 267 290    BMET  Recent Labs  07/12/14 1512 07/13/14 0420  NA 136 138  K 3.7 3.7  CL 101 104  CO2 26 27  GLUCOSE 109* 114*  BUN 18 15  CREATININE 0.65 0.82  CALCIUM 9.0 8.2*   PT/INR No results for input(s): LABPROT, INR in the last 72 hours.   Recent Labs Lab 07/11/14 0524 07/12/14 1512 07/13/14 0420  AST 24 17 12   ALT 23 20 16   ALKPHOS 103 91 72  BILITOT 0.4 0.4 0.6  PROT 7.8 8.4* 7.2  ALBUMIN 3.8 4.0 3.2*     Lipase     Component Value Date/Time   LIPASE 20 07/12/2014 1512     Studies/Results: Dg Chest 2 View  07/12/2014   CLINICAL DATA:  Weakness, shortness of breath, epigastric pain. Preop for cholecystectomy.  EXAM: CHEST  2 VIEW  COMPARISON:  Single frontal view earlier this day.  FINDINGS: Lung volumes are low. The cardiomediastinal contours are unchanged, the heart remains at the upper limits of normal in size. Minimal bibasilar atelectasis. Pulmonary vasculature is normal. No consolidation, pleural effusion, or pneumothorax. No acute osseous abnormalities are seen.  IMPRESSION: Minimal bibasilar atelectasis.   Electronically  Signed   By: Rubye OaksMelanie  Ehinger M.D.   On: 07/12/2014 21:07   Dg Chest Portable 1 View  07/12/2014   CLINICAL DATA:  Right side abdominal pain today.  Vomiting.  EXAM: PORTABLE CHEST - 1 VIEW  COMPARISON:  PA and lateral chest 04/29/2013.  FINDINGS: The lungs are clear. Heart size is upper normal. No pneumothorax or pleural effusion.  IMPRESSION: No acute disease.   Electronically Signed   By: Drusilla Kannerhomas  Dalessio M.D.   On: 07/12/2014 18:45    Medications: . antiseptic oral rinse  7 mL Mouth Rinse q12n4p  . chlorhexidine  15 mL Mouth Rinse BID  . divalproex  250 mg Oral TID  . enoxaparin (LOVENOX) injection  40 mg Subcutaneous Q24H  . piperacillin-tazobactam (ZOSYN)  IV  3.375 g Intravenous Q8H  . pravastatin  20 mg Oral QHS  . verapamil  120 mg Oral Daily   . 0.9 % NaCl with KCl 20 mEq / L 100 mL/hr (07/13/14 [id number])   Prior to Admission medications   Medication Sig Start Date End Date Taking? Authorizing Provider  budesonide-formoterol (SYMBICORT) 80-4.5 MCG/ACT inhaler Inhale 2 puffs into the lungs every 12 (twelve) hours as needed. Shortness of breath   Yes Historical Provider, MD  divalproex (DEPAKOTE) 250 MG DR tablet Take 250 mg by mouth 3 (three) times daily.  Yes Historical Provider, MD  levalbuterol [facility] HFA) 45 MCG/ACT inhaler Inhale 1-2 puffs into the lungs every 4 (four) hours as needed. Shortness of breath   Yes Historical Provider, MD  oxyCODONE-acetaminophen (PERCOCET/ROXICET) 5-325 MG per tablet Take 1 tablet by mouth every 6 (six) hours as needed for severe pain. 07/11/14  Yes Shon Baton, MD  pravastatin (PRAVACHOL) 20 MG tablet Take 20 mg by mouth at bedtime.    Yes Historical Provider, MD  tetrahydrozoline (EYE DROPS) 0.05 % ophthalmic solution Place 1 drop into both eyes daily as needed. Dry Eyes   Yes Historical Provider, MD  verapamil (CALAN) 120 MG tablet Take 1 tablet by mouth daily. 09/23/13  Yes Historical Provider, MD  ondansetron (ZOFRAN) 4 MG tablet Take 1  tablet (4 mg total) by mouth every 8 (eight) hours as needed for nausea or vomiting. Patient not taking: Reported on 07/12/2014 07/11/14   Shon Baton, MD     Assessment/Plan Acute cholecystitis with cholelithiasis. Hx of CVA with right hemiparesis Hypertension Hx of seizures Hx of depresion Hx of asthma Degenerative disc disease  Lovenox/SCD for DVT prophylaxis   Plan:  Surgery today, on Zosyn.       Caroleann Casler 07/13/2014

## 2014-07-13 NOTE — Transfer of Care (Signed)
Immediate Anesthesia Transfer of Care Note  Patient: Patty [name]  Procedure(s) Performed: Procedure(s): LAPAROSCOPIC CONVERTED TO OPEN  CHOLECYSTECTOMY  (N/A)  Patient Location: PACU  Anesthesia Type:General  Level of Consciousness: awake, alert  and oriented  Airway & Oxygen Therapy: Patient Spontanous Breathing and Patient connected to face mask oxygen  Post-op Assessment: Report given to RN and Post -op Vital signs reviewed and stable  Post vital signs: Reviewed and stable  Last Vitals:  Filed Vitals:   07/13/14 0526  BP: 109/61  Pulse: 73  Temp: 37.2 C  Resp: 18    Complications: No apparent anesthesia complications

## 2014-07-13 NOTE — Progress Notes (Signed)
UR completed 

## 2014-07-13 NOTE — Addendum Note (Signed)
Addendum  created 07/13/14 1812 by Atilano MedianPeggy D Eliott Amparan, CRNA   Modules edited: Anesthesia Attestations

## 2014-07-13 NOTE — Brief Op Note (Signed)
07/12/2014 - 07/13/2014  5:00 PM  PATIENT:  Georgeann Oppenheimlara M Gramling  49 y.o. female  PRE-OPERATIVE DIAGNOSIS:  Acute cholecystitis, cholelithiasis  POST-OPERATIVE DIAGNOSIS:  same  PROCEDURE:  Procedure(s): LAPAROSCOPIC CONVERTED TO OPEN  CHOLECYSTECTOMY  (N/A)  SURGEON:  Surgeon(s) and Role:    * Darnell Levelodd Bobbijo Holst, MD - Primary  ANESTHESIA:   general  EBL:  Total I/O In: 2000 [I.V.:2000] Out: -   BLOOD ADMINISTERED:none  DRAINS: (19Fr) Blake drain(s) in the gallbladder bed   LOCAL MEDICATIONS USED:  NONE  SPECIMEN:  Excision  DISPOSITION OF SPECIMEN:  PATHOLOGY  COUNTS:  YES  TOURNIQUET:  * No tourniquets in log *  DICTATION: .Other Dictation: Dictation Number [id number]  PLAN OF CARE: Admit for overnight observation  PATIENT DISPOSITION:  PACU - hemodynamically stable.   Delay start of Pharmacological VTE agent (>24hrs) due to surgical blood loss or risk of bleeding: yes  Velora Hecklerodd M. Nagi Furio, MD, [facility] Office: [phone]

## 2014-07-14 ENCOUNTER — Encounter (HOSPITAL_COMMUNITY): Payer: Self-pay | Admitting: Surgery

## 2014-07-14 DIAGNOSIS — M5136 Other intervertebral disc degeneration, lumbar region: Secondary | ICD-10-CM | POA: Diagnosis present

## 2014-07-14 DIAGNOSIS — F329 Major depressive disorder, single episode, unspecified: Secondary | ICD-10-CM | POA: Diagnosis present

## 2014-07-14 DIAGNOSIS — I69928 Other speech and language deficits following unspecified cerebrovascular disease: Secondary | ICD-10-CM | POA: Diagnosis not present

## 2014-07-14 DIAGNOSIS — K8000 Calculus of gallbladder with acute cholecystitis without obstruction: Secondary | ICD-10-CM | POA: Diagnosis not present

## 2014-07-14 DIAGNOSIS — Z90711 Acquired absence of uterus with remaining cervical stump: Secondary | ICD-10-CM | POA: Diagnosis present

## 2014-07-14 DIAGNOSIS — R569 Unspecified convulsions: Secondary | ICD-10-CM | POA: Diagnosis not present

## 2014-07-14 DIAGNOSIS — L98499 Non-pressure chronic ulcer of skin of other sites with unspecified severity: Secondary | ICD-10-CM | POA: Diagnosis present

## 2014-07-14 DIAGNOSIS — E785 Hyperlipidemia, unspecified: Secondary | ICD-10-CM | POA: Diagnosis present

## 2014-07-14 DIAGNOSIS — J45909 Unspecified asthma, uncomplicated: Secondary | ICD-10-CM | POA: Diagnosis present

## 2014-07-14 DIAGNOSIS — I10 Essential (primary) hypertension: Secondary | ICD-10-CM | POA: Diagnosis not present

## 2014-07-14 DIAGNOSIS — K810 Acute cholecystitis: Secondary | ICD-10-CM | POA: Diagnosis present

## 2014-07-14 DIAGNOSIS — I69951 Hemiplegia and hemiparesis following unspecified cerebrovascular disease affecting right dominant side: Secondary | ICD-10-CM | POA: Diagnosis not present

## 2014-07-14 DIAGNOSIS — Z79899 Other long term (current) drug therapy: Secondary | ICD-10-CM | POA: Diagnosis not present

## 2014-07-14 NOTE — Progress Notes (Signed)
UR completed 

## 2014-07-14 NOTE — Clinical Documentation Improvement (Signed)
"  She has an ulcer in her peroneal area under her panus" is documented in progress note 07/14/14.  Nursing instructed to keep area clean and dry.  Please document the TYPE of ulcer in the progress notes and discharge summary:   - Skin Ulcer, including approximate size  - Pressure Ulcer, including stage and if present on admission  - Other type of ulcer  - Unable to Clinically Determine  Thank You, Jerral Ralphathy R Mirabel Ahlgren ,RN Clinical Documentation Specialist:  [phone] [facility]- Health Information Management

## 2014-07-14 NOTE — Progress Notes (Signed)
1 Day Post-Op  Subjective: Complains of some bleeding when she bathed perineal area.  Tender, but moving up in bathroom earlier this AM.  Objective: Vital signs in last 24 hours: Temp:  [98.2 F (36.8 C)-99.5 F (37.5 C)] 98.2 F (36.8 C) (04/14 0600) Pulse Rate:  [83-98] 87 (04/14 0600) Resp:  [13-24] 18 (04/14 0600) BP: (133-188)/(84-108) 152/99 mmHg (04/14 0600) SpO2:  [92 %-98 %] 95 % (04/14 0600) Last BM Date: 07/11/14 Diet: clears  Drain 35 Afebrile, TM 99.5 WBC is up Intake/Output from previous day: 04/13 0701 - 04/14 0700 In: 3946.7 [I.V.:3846.7; IV Piggyback:100] Out: 35 [Drains:35] Intake/Output this shift:    General appearance: alert, cooperative and no distress Resp: clear to auscultation bilaterally GI: tender and sore, no bowel sounds,  Dressings dry, drainage is clear.  Lab Results:   Recent Labs  07/13/14 0420 07/13/14 1935  WBC 11.6* 18.8*  HGB 12.1 12.5  HCT 38.3 39.8  PLT 290 320    BMET  Recent Labs  07/12/14 1512 07/13/14 0420 07/13/14 1935  NA 136 138  --   K 3.7 3.7  --   CL 101 104  --   CO2 26 27  --   GLUCOSE 109* 114*  --   BUN 18 15  --   CREATININE 0.65 0.82 0.81  CALCIUM 9.0 8.2*  --    PT/INR No results for input(s): LABPROT, INR in the last 72 hours.   Recent Labs Lab 07/11/14 0524 07/12/14 1512 07/13/14 0420  AST ALT ALKPHOS 103 91 72  BILITOT 0.4 0.4 0.6  PROT 7.8 8.4* 7.2  ALBUMIN 3.8 4.0 3.2*     Lipase     Component Value Date/Time   LIPASE 20 07/12/2014 1512     Studies/Results: Dg Chest 2 View  07/12/2014   CLINICAL DATA:  Weakness, shortness of breath, epigastric pain. Preop for cholecystectomy.  EXAM: CHEST  2 VIEW  COMPARISON:  Single frontal view earlier this day.  FINDINGS: Lung volumes are low. The cardiomediastinal contours are unchanged, the heart remains at the upper limits of normal in size. Minimal bibasilar atelectasis. Pulmonary vasculature is normal. No  consolidation, pleural effusion, or pneumothorax. No acute osseous abnormalities are seen.  IMPRESSION: Minimal bibasilar atelectasis.   Electronically Signed   By: Rubye Oaks M.D.   On: 07/12/2014 21:07   Dg Chest Portable 1 View  07/12/2014   CLINICAL DATA:  Right side abdominal pain today.  Vomiting.  EXAM: PORTABLE CHEST - 1 VIEW  COMPARISON:  PA and lateral chest 04/29/2013.  FINDINGS: The lungs are clear. Heart size is upper normal. No pneumothorax or pleural effusion.  IMPRESSION: No acute disease.   Electronically Signed   By: Drusilla Kanner M.D.   On: 07/12/2014 18:45    Medications: . divalproex  250 mg Oral TID  . enoxaparin (LOVENOX) injection  40 mg Subcutaneous Q24H  . piperacillin-tazobactam (ZOSYN)  IV  3.375 g Intravenous Q8H  . verapamil  120 mg Oral Daily   . dextrose 5 % and 0.45 % NaCl with KCl 20 mEq/L 75 mL/hr (07/13/14 2007)   Prior to Admission medications   Medication Sig Start Date End Date Taking? Authorizing Provider  budesonide-formoterol (SYMBICORT) 80-4.5 MCG/ACT inhaler Inhale 2 puffs into the lungs every 12 (twelve) hours as needed. Shortness of breath   Yes Historical Provider, MD  divalproex (DEPAKOTE) 250 MG DR tablet Take 250 mg by mouth 3 (three) times  daily.   Yes Historical Provider, MD  levalbuterol [facility](XOPENEX HFA) 45 MCG/ACT inhaler Inhale 1-2 puffs into the lungs every 4 (four) hours as needed. Shortness of breath   Yes Historical Provider, MD  oxyCODONE-acetaminophen (PERCOCET/ROXICET) 5-325 MG per tablet Take 1 tablet by mouth every 6 (six) hours as needed for severe pain. 07/11/14  Yes Shon Batonourtney F Horton, MD  pravastatin (PRAVACHOL) 20 MG tablet Take 20 mg by mouth at bedtime.    Yes Historical Provider, MD  tetrahydrozoline (EYE DROPS) 0.05 % ophthalmic solution Place 1 drop into both eyes daily as needed. Dry Eyes   Yes Historical Provider, MD  verapamil (CALAN) 120 MG tablet Take 1 tablet by mouth daily. 09/23/13  Yes Historical Provider, MD   ondansetron (ZOFRAN) 4 MG tablet Take 1 tablet (4 mg total) by mouth every 8 (eight) hours as needed for nausea or vomiting. Patient not taking: Reported on 07/12/2014 07/11/14   Shon Batonourtney F Horton, MD     Assessment/Plan Acute cholecystitis, cholelithiasis. Attempted laparoscopic cholecystectomy.  Open cholecystectomy; 07/13/14, Dr. Darnell Levelodd Gerkin Hx of CVA with right hemiparesis Hypertension Hx of seizures Hx of depresion Hx of asthma Degenerative disc disease Lovenox/SCD for DVT prophylaxis Day 3 Zosyn   Plan:  She has an ulcer in her peroneal area under her panus.  This whole area is moist.  I have spoken to the nurse about keeping it clean and dry.  Mobilize her some today, keep her on clears till she has some flatus, take dressing down tomorrow.     Patty [name] 07/14/2014

## 2014-07-14 NOTE — Op Note (Signed)
Patty [name], Patty [name] NO.:  [id number]  MEDICAL RECORD NO.:  [id number]  LOCATION:  1532                         FACILITY:  [facility]  PHYSICIAN:  Velora Heckler, MD      DATE OF BIRTH:  March 03, 1966  DATE OF PROCEDURE:  07/13/2014                              OPERATIVE REPORT   PREOPERATIVE DIAGNOSIS:  Acute cholecystitis, cholelithiasis.  POSTOPERATIVE DIAGNOSIS:  Acute cholecystitis, cholelithiasis.  PROCEDURES: 1. Attempted laparoscopic cholecystectomy. 2. Open cholecystectomy.  SURGEON:  Velora Heckler, MD, FACS  ANESTHESIA:  General.  ESTIMATED BLOOD LOSS:  150 mL.  PREPARATION:  ChloraPrep.  COMPLICATIONS:  None.  INDICATIONS:  The patient is a 49 year old female, admitted to the Surgical Service with acute cholecystitis and cholelithiasis.  The patient now comes to the operating room for cholecystectomy.  BODY OF REPORT:  Procedure was done in OR #1 at the [facility].  The patient was brought to the operating room and placed in supine position on the operating room table.  Following administration of general anesthesia, the patient was positioned and then prepped and draped in the usual strict aseptic fashion.  After ascertaining that an adequate level of anesthesia had been achieved, an infraumbilical incision was made transversely with a #15 blade. Dissection was carried through the subcutaneous tissues.  There was rather extensive scar tissue present from previous laparotomy. Dissection was carried down to the fascia.  Fascia was incised in the midline and the peritoneal cavity was entered cautiously.  A 0 Vicryl pursestring suture was placed in the fascia.  A Hasson cannula was introduced and pneumoperitoneum was established.  Laparoscope was introduced and the abdomen explored.  There were multiple adhesions to the lower midline surgical wound.  There were adhesions in the upper midline.  Right upper quadrant was visualized.   An operative port was placed in the anterior axillary line along the right costal margin. There was a markedly distended, inflamed gallbladder, which appeared somewhat intrahepatic.  A second operative port was placed in the subxiphoid position under direct vision.  Adhesions were taken down with the electrocautery.  Gallbladder was elevated, but poor visualization was obtained due to the confined space and the very large gallbladder. Liver was also quite rigid and it was difficult to view the undersurface of the gallbladder.  The neck of the gallbladder could not be visualized.  Therefore, decision was made to convert to an open procedure.  Ports were removed and pneumoperitoneum released.  After instruments were reset, a right subcostal incision was made with a #10 blade. Dissection was carried through the subcutaneous tissues.  Fascia was incised.  A muscular tissue was divided with the electrocautery. Peritoneal cavity was entered cautiously.  A Balfour retractor was placed for exposure.  Gallbladder was identified.  It was quite large, indurated, and erythematous.  Using the electrocautery, the gallbladder was taken down retrograde from the fundus towards the neck of the gallbladder.  Hemostasis was obtained in the gallbladder bed with the electrocautery.  Small vessels were divided between Ligaclips.  With general retraction and dissection, dissection was carried down to the neck of the gallbladder where cystic duct was doubly  clipped and divided.  Further dissection allowed isolation of the cystic duct, which was then doubly clipped with two hemoclips and then divided with the scissors.  The entire gallbladder was extracted and submitted to Pathology.  Good hemostasis was noted in the gallbladder bed.  Abdomen was irrigated with warm saline.  Good hemostasis was noted.  A 19-French Blake drain was brought in from a right abdominal wall stab wound and placed into the gallbladder  bed.  It was secured to the skin with 3-0 nylon suture.  Abdominal incision was closed in two layers with running #1 PDS sutures.  Subcutaneous tissues were irrigated.  Skin was closed with stainless steel staples.  Umbilical wound was then closed by extracting the Vicryl purse-string suture.  Using a #1 Vicryl and a #1 Novafil suture, the incision in the midline of the fascia was closed with interrupted sutures.  Subcutaneous tissues were irrigated and skin was closed with stainless steel staples. Drain was placed to bulb suction.  Sterile dressings were applied.  The patient was awakened from anesthesia and brought to the recovery room. The patient tolerated the procedure well.   Velora Hecklerodd M. Dayln Tugwell, MD, [facility] Office: [phone]    TMG/MEDQ  D:  07/13/2014  T:  07/14/2014  Job:  [id number]

## 2014-07-15 LAB — CBC
HEMATOCRIT: 36.1 % (ref 36.0–46.0)
HEMOGLOBIN: 11.3 g/dL — AB (ref 12.0–15.0)
MCH: 30.3 pg (ref 26.0–34.0)
MCHC: 31.3 g/dL (ref 30.0–36.0)
MCV: 96.8 fL (ref 78.0–100.0)
Platelets: 283 10*3/uL (ref 150–400)
RBC: 3.73 MIL/uL — AB (ref 3.87–5.11)
RDW: 13.1 % (ref 11.5–15.5)
WBC: 11.4 10*3/uL — ABNORMAL HIGH (ref 4.0–10.5)

## 2014-07-15 LAB — BASIC METABOLIC PANEL
Anion gap: 8 (ref 5–15)
BUN: 7 mg/dL (ref 6–23)
CALCIUM: 8.3 mg/dL — AB (ref 8.4–10.5)
CO2: 27 mmol/L (ref 19–32)
Chloride: 103 mmol/L (ref 96–112)
Creatinine, Ser: 0.74 mg/dL (ref 0.50–1.10)
GFR calc Af Amer: >90 mL/min (ref >90)
GLUCOSE: 114 mg/dL — AB (ref 70–99)
Potassium: 3.7 mmol/L (ref 3.5–5.1)
Sodium: 138 mmol/L (ref 135–145)

## 2014-07-15 NOTE — Progress Notes (Signed)
[facility] Progress Note  2 Days Post-Op  Subjective: Pt doing well, no N/V.  Pain improved.  Ambulating OOB through halls and is in the chair.  Tolerating liquids.  No flatus or BM yet.  IS to 750.  Objective: Vital signs in last 24 hours: Temp:  [98.6 F (37 C)-100.1 F (37.8 C)] 100.1 F (37.8 C) (04/15 0552) Pulse Rate:  [71-94] 71 (04/15 0552) Resp:  [18] 18 (04/15 0552) BP: (109-137)/(70-97) 137/97 mmHg (04/15 0552) SpO2:  [99 %-100 %] 100 % (04/15 0552) Last BM Date: 07/11/14  Intake/Output from previous day: 04/14 0701 - 04/15 0700 In: 2870 [P.O.:1920; I.V.:950] Out: 3235 [Urine:3125; Drains:110] Intake/Output this shift:    PE: Gen:  Alert, NAD, pleasant Abd: Soft, mild distension, tender over incisions and drain, diminished BS, no HSM, incisions in RUQ (open) C/D/I with dressing's in place, drain with serosanguinous drainage.   Lab Results:   Recent Labs  07/13/14 0420 07/13/14 1935  WBC 11.6* 18.8*  HGB 12.1 12.5  HCT 38.3 39.8  PLT 290 320   BMET  Recent Labs  07/12/14 1512 07/13/14 0420 07/13/14 1935  NA 136 138  --   K 3.7 3.7  --   CL 101 104  --   CO2 26 27  --   GLUCOSE 109* 114*  --   BUN 18 15  --   CREATININE 0.65 0.82 0.81  CALCIUM 9.0 8.2*  --    PT/INR No results for input(s): LABPROT, INR in the last 72 hours. CMP     Component Value Date/Time   NA 138 07/13/2014 0420   NA 138 10/17/2011 1511   K 3.7 07/13/2014 0420   K 3.9 10/17/2011 1511   CL 104 07/13/2014 0420   CO2 27 07/13/2014 0420   GLUCOSE 114* 07/13/2014 0420   BUN 15 07/13/2014 0420   BUN 6 10/17/2011 1511   CREATININE 0.81 07/13/2014 1935   CREATININE 0.78 10/17/2011 1511   CALCIUM 8.2* 07/13/2014 0420   CALCIUM 9.5 10/17/2011 1511   PROT 7.2 07/13/2014 0420   ALBUMIN 3.2* 07/13/2014 0420   AST 12 07/13/2014 0420   AST 12 10/17/2011 1511   ALT 16 07/13/2014 0420   ALKPHOS 72 07/13/2014 0420   ALKPHOS 77 10/17/2011 1511   BILITOT 0.6  07/13/2014 0420   BILITOT 0.3 10/17/2011 1511   GFRNONAA 84* 07/13/2014 1935   GFRAA >90 07/13/2014 1935   Lipase     Component Value Date/Time   LIPASE 20 07/12/2014 1512       Studies/Results: No results found.  Anti-infectives: Anti-infectives    Start     Dose/Rate Route Frequency Ordered Stop   07/12/14 1900  piperacillin-tazobactam (ZOSYN) IVPB 3.375 g     3.375 g 12.5 mL/hr over 240 Minutes Intravenous Every 8 hours 07/12/14 1850         Assessment/Plan Acute cholecystitis, cholelithiasis. POD #2 s/p Attempted lap chole converted to open cholecystectomy; 07/13/14, Dr. Darnell Levelodd Gerkin -IVF, pain control, antiemetics, IV antibiotics -Ambulate and IS -Day #4 Zosyn -Tolerating clears -Repeat labs pending -Continue JP drain, may be able to d/c it prior to d/c if low output and clear per Dr. Gerrit FriendsGerkin (16510mL/24hr) Skin ulceration (under panus) - monitor, keep clean/dry Hx of CVA with right hemiparesis Hypertension  Hx of seizures Hx of depresion Hx of asthma Degenerative disc disease DVT prophylaxis - Lovenox/SCD      LOS: 1 day    DORT, Arelene Moroni 07/15/2014, 7:42 AM Pager: [phone]

## 2014-07-15 NOTE — Evaluation (Signed)
Physical Therapy Evaluation Patient Details Name: Patty [name] MRN: [id number] DOB: 1965-08-17 Today's Date: 07/15/2014   History of Present Illness  49 yo female s/p cholecystectomy 07/13/14. Hx of Sz, CVA with residual R side weakness, HTn, lumbar DDD.   Clinical Impression  On eval, pt required Min guard assist for mobility-able to ambulate ~500 feet with support from IV pole. Pain rated ~5/10 with activity. Recommend HHPT and home health aide, if possible.     Follow Up Recommendations Home health PT (home health aide if possible)    Equipment Recommendations   (to be determined)    Recommendations for Other Services       Precautions / Restrictions Precautions Precautions: Fall Precaution Comments: jp drain Restrictions Weight Bearing Restrictions: No      Mobility  Bed Mobility Overal bed mobility: Needs Assistance Bed Mobility: Supine to Sit;Sit to Supine     Supine to sit: HOB elevated;Min guard Sit to supine: HOB elevated;Min guard   General bed mobility comments: Increased time. Heavy reliance on bedrails.   Transfers Overall transfer level: Needs assistance   Transfers: Sit to/from Stand Sit to Stand: Min guard         General transfer comment: close guard for safety.   Ambulation/Gait Ambulation/Gait assistance: Min guard Ambulation Distance (Feet): 500 Feet Assistive device:  (IV pole) Gait Pattern/deviations: Step-through pattern     General Gait Details: slow gait speed. slightly unsteady requiring support of IV pole to steady. Pt able to walk short distance without support intermittently.   Stairs            Wheelchair Mobility    Modified Rankin (Stroke Patients Only)       Balance                                             Pertinent Vitals/Pain Pain Assessment: 0-10 Pain Score: 5  Pain Location: abdomen with activity Pain Intervention(s): Monitored during session    Home Living Family/patient  expects to be discharged to:: Private residence Living Arrangements: Children Available Help at Discharge: Family Type of Home: House Home Access: Stairs to enter Entrance Stairs-Rails: Right Entrance Stairs-Number of Steps: 3 Home Layout: One level Home Equipment: None      Prior Function Level of Independence: Independent               Hand Dominance        Extremity/Trunk Assessment   Upper Extremity Assessment: RUE deficits/detail RUE Deficits / Details: residual weakness from previous CVA         Lower Extremity Assessment: RLE deficits/detail RLE Deficits / Details: residual weakness from previous CVA    Cervical / Trunk Assessment: Normal  Communication   Communication: No difficulties  Cognition Arousal/Alertness: Awake/alert Behavior During Therapy: WFL for tasks assessed/performed Overall Cognitive Status: Within Functional Limits for tasks assessed                      General Comments      Exercises        Assessment/Plan    PT Assessment Patient needs continued PT services  PT Diagnosis Difficulty walking;Acute pain   PT Problem List Decreased activity tolerance;Decreased balance;Decreased mobility;Pain;Obesity  PT Treatment Interventions Gait training;Functional mobility training;Therapeutic activities;Therapeutic exercise;Patient/family education;Balance training   PT Goals (Current goals can be found in the Care Plan section)  Acute Rehab PT Goals Patient Stated Goal: less pain. home soon. PT Goal Formulation: With patient Time For Goal Achievement: 07/22/14 Potential to Achieve Goals: Good    Frequency Min 3X/week   Barriers to discharge        Co-evaluation               End of Session   Activity Tolerance: Patient tolerated treatment well Patient left: in bed;with call bell/phone within reach           Time: [id number] PT Time Calculation (min) (ACUTE ONLY): 20 min   Charges:   PT  Evaluation $Initial PT Evaluation Tier I: 1 Procedure     PT G Codes:        Patty [name], MPT Pager: [phone]

## 2014-07-16 LAB — BASIC METABOLIC PANEL
Anion gap: 7 (ref 5–15)
BUN: 8 mg/dL (ref 6–23)
CO2: 28 mmol/L (ref 19–32)
CREATININE: 0.66 mg/dL (ref 0.50–1.10)
Calcium: 8.2 mg/dL — ABNORMAL LOW (ref 8.4–10.5)
Chloride: 104 mmol/L (ref 96–112)
GFR calc Af Amer: >90 mL/min (ref >90)
GFR calc non Af Amer: >90 mL/min (ref >90)
GLUCOSE: 95 mg/dL (ref 70–99)
Potassium: 3.7 mmol/L (ref 3.5–5.1)
Sodium: 139 mmol/L (ref 135–145)

## 2014-07-16 LAB — CBC
HEMATOCRIT: 34.2 % — AB (ref 36.0–46.0)
HEMOGLOBIN: 10.7 g/dL — AB (ref 12.0–15.0)
MCH: 30.1 pg (ref 26.0–34.0)
MCHC: 31.3 g/dL (ref 30.0–36.0)
MCV: 96.3 fL (ref 78.0–100.0)
PLATELETS: 284 10*3/uL (ref 150–400)
RBC: 3.55 MIL/uL — ABNORMAL LOW (ref 3.87–5.11)
RDW: 12.9 % (ref 11.5–15.5)
WBC: 8.3 10*3/uL (ref 4.0–10.5)

## 2014-07-16 MED ORDER — SACCHAROMYCES BOULARDII 250 MG PO CAPS
250.0000 mg | ORAL_CAPSULE | Freq: Two times a day (BID) | ORAL | Status: DC
  Administered 2014-07-16 – 2014-07-17 (×2): 250 mg via ORAL
  Filled 2014-07-16 (×3): qty 1

## 2014-07-16 MED ORDER — AMOXICILLIN-POT CLAVULANATE 875-125 MG PO TABS
1.0000 | ORAL_TABLET | Freq: Two times a day (BID) | ORAL | Status: DC
  Administered 2014-07-16 – 2014-07-17 (×2): 1 via ORAL
  Filled 2014-07-16 (×3): qty 1

## 2014-07-16 NOTE — Progress Notes (Signed)
RE:  IV.  Pt lost IV access; IV team unable to find site for new PIV.  Notified Dr. Abbey Chattersosenbower who changed abx to Augmentin.  Possible d/c tomorrow.

## 2014-07-16 NOTE — Progress Notes (Signed)
3 Days Post-Op  Subjective: Tolerating full liquids.  No flatus or BM.  Using walker when ambulating to help with balance.  Objective: Vital signs in last 24 hours: Temp:  [97.6 F (36.4 C)-98 F (36.7 C)] 97.6 F (36.4 C) (04/16 0615) Pulse Rate:  [72-78] 78 (04/16 0615) Resp:  [18] 18 (04/16 0615) BP: (105-143)/(61-94) 105/61 mmHg (04/16 0615) SpO2:  [90 %-100 %] 100 % (04/16 0615) Last BM Date: 07/11/14  Intake/Output from previous day: 04/15 0701 - 04/16 0700 In: 2170 [P.O.:1470; I.V.:700] Out: 2780 [Urine:2650; Drains:130] Intake/Output this shift:    PE: General- In NAD Abdomen-soft, incisions clean and intact, serous drain output  Lab Results:   Recent Labs  07/15/14 0812 07/16/14 0516  WBC 11.4* 8.3  HGB 11.3* 10.7*  HCT 36.1 34.2*  PLT 283 284   BMET  Recent Labs  07/15/14 0812 07/16/14 0516  NA 138 139  K 3.7 3.7  CL 103 104  CO2 27 28  GLUCOSE 114* 95  BUN 7 8  CREATININE 0.74 0.66  CALCIUM 8.3* 8.2*   PT/INR No results for input(s): LABPROT, INR in the last 72 hours. Comprehensive Metabolic Panel:    Component Value Date/Time   NA 139 07/16/2014 0516   NA 138 07/15/2014 0812   NA 138 10/17/2011 1511   K 3.7 07/16/2014 0516   K 3.7 07/15/2014 0812   K 3.9 10/17/2011 1511   CL 104 07/16/2014 0516   CL 103 07/15/2014 0812   CO2 28 07/16/2014 0516   CO2 27 07/15/2014 0812   BUN 8 07/16/2014 0516   BUN 7 07/15/2014 0812   BUN 6 10/17/2011 1511   CREATININE 0.66 07/16/2014 0516   CREATININE 0.74 07/15/2014 0812   CREATININE 0.78 10/17/2011 1511   GLUCOSE 95 07/16/2014 0516   GLUCOSE 114* 07/15/2014 0812   CALCIUM 8.2* 07/16/2014 0516   CALCIUM 8.3* 07/15/2014 0812   CALCIUM 9.5 10/17/2011 1511   AST 12 07/13/2014 0420   AST 17 07/12/2014 1512   AST 12 10/17/2011 1511   ALT 16 07/13/2014 0420   ALT 20 07/12/2014 1512   ALKPHOS 72 07/13/2014 0420   ALKPHOS 91 07/12/2014 1512   ALKPHOS 77 10/17/2011 1511   BILITOT 0.6  07/13/2014 0420   BILITOT 0.4 07/12/2014 1512   BILITOT 0.3 10/17/2011 1511   PROT 7.2 07/13/2014 0420   PROT 8.4* 07/12/2014 1512   ALBUMIN 3.2* 07/13/2014 0420   ALBUMIN 4.0 07/12/2014 1512     Studies/Results: No results found.  Anti-infectives: Anti-infectives    Start     Dose/Rate Route Frequency Ordered Stop   07/12/14 1900  piperacillin-tazobactam (ZOSYN) IVPB 3.375 g     3.375 g 12.5 mL/hr over 240 Minutes Intravenous Every 8 hours 07/12/14 1850        Assessment   LOS: 2 days  Acute cholecystitis, cholelithiasis s/p Attempted lap chole converted to open cholecystectomy; 07/13/14, Dr. Darnell Levelodd Gerkin -IV antibiotics -Ambulate and IS -Day #5 Zosyn -Tolerating fulls - -Continue JP drain, may be able to d/c it prior to d/c if low output and clear  Skin ulceration (under panus) - monitor, keep clean/dry Hx of CVA with right hemiparesis Hypertension  Hx of seizures Hx of depresion Hx of asthma Degenerative disc disease DVT prophylaxis - Lovenox/SCD   Plan: Advance diet. Continue abxs.  Saline lock IV.   Brina Umeda J 07/16/2014

## 2014-07-16 NOTE — Progress Notes (Signed)
Performed bladder scan to ensure pt not retaining urine.  Volume estimated at 60 ml.  No action required.

## 2014-07-16 NOTE — Plan of Care (Signed)
Problem: Phase II Progression Outcomes Goal: Dressings dry/intact Outcome: Completed/Met Date Met:  07/16/14 Removed by MD this a.m. Goal: Return of bowel function (flatus, BM) IF ABDOMINAL SURGERY:  Outcome: Progressing + flatus. No BM.

## 2014-07-17 MED ORDER — ONDANSETRON HCL 4 MG PO TABS: 4.0000 mg | ORAL_TABLET | Freq: Four times a day (QID) | ORAL | Status: DC | PRN

## 2014-07-17 MED ORDER — OXYCODONE HCL 5 MG PO TABS: 5.0000 mg | ORAL_TABLET | ORAL | Status: DC | PRN

## 2014-07-17 MED ORDER — AMOXICILLIN-POT CLAVULANATE 875-125 MG PO TABS: 1.0000 | ORAL_TABLET | Freq: Two times a day (BID) | ORAL | Status: DC

## 2014-07-17 NOTE — Discharge Instructions (Signed)
Open Cholecystectomy, Care After Refer to this sheet in the next few weeks. These instructions provide you with information on caring for yourself after your procedure. Your health care provider may also give you more specific instructions. Your treatment has been planned according to current medical practices, but problems sometimes occur. Call your health care provider if you have any problems or questions after your procedure. WHAT TO EXPECT AFTER THE PROCEDURE After your procedure, it is typical to have the following:  Pain at your incision site. You will be given medicines to control this pain.  Constipation. Take a laxative of your choice for this. HOME CARE INSTRUCTIONS   Change bandages (dressings) once a day until area is healed.  Keep the wound dry and clean. It is okay to take showers,  Do not take baths.  Only take over-the-counter or prescription medicines as directed by your health care provider.  Continue your normal diet as directed by your health care provider. Eat plenty of fruits and vegetables to help prevent constipation.  Drink enough fluids to keep your urine clear or pale yellow. This also helps prevent constipation.  Do not lift anything heavier than 10 pounds (4.5 kg) for 6 weeks or until your health care provider approves.  Do not play contact sports for 1 month or until your health care provider approves.  Do not return to work or school for 4 weeks or until your health care provider approves. SEEK MEDICAL CARE IF      You have redness, swelling, or increasing pain in the wound.  You see a yellowish-white fluid (pus) or have a lot of blood coming from the wound.  You have drainage from the wound that lasts longer than 1 day.  You notice a bad smell coming from the wound or dressing.  Your wound breaks open after the stitches (sutures) or staples have been removed.  You have increasing pain in the shoulders (shoulder strap areas).  You have dizzy  episodes or faint while standing.  You  throw up (vomit).  You have a high fever (>101.5)  You have severe abdominal pain.  FOLLOW UP APPOINTMENT: [facility] office will call you with this. [phone]). Document Released: 07/04/2003 Document Revised: 01/06/2013 Document Reviewed: 10/28/2012 [facility] Patient Information 2015 [employer], [place]. This information is not intended to replace advice given to you by your health care provider. Make sure you discuss any questions you have with your health care provider.

## 2014-07-17 NOTE — Progress Notes (Signed)
4 Days Post-Op  Subjective: Tolerating her diet.  Passing gas.  Tolerating diet.  Objective: Vital signs in last 24 hours: Temp:  [98.5 F (36.9 C)-98.8 F (37.1 C)] 98.5 F (36.9 C) (04/17 0651) Pulse Rate:  [76-82] 78 (04/17 0651) Resp:  [18] 18 (04/17 0651) BP: (124-138)/(64-78) 124/78 mmHg (04/17 0651) SpO2:  [96 %-99 %] 98 % (04/17 0651) Last BM Date: 07/11/14  Intake/Output from previous day: 04/16 0701 - 04/17 0700 In: 1237.5 [P.O.:600; I.V.:487.5; IV Piggyback:150] Out: 1125 [Urine:950; Drains:175] Intake/Output this shift:    PE: General- In NAD Abdomen-soft, incisions clean and intact, serous drain output-drains removed.  Lab Results:   Recent Labs  07/15/14 0812 07/16/14 0516  WBC 11.4* 8.3  HGB 11.3* 10.7*  HCT 36.1 34.2*  PLT 283 284   BMET  Recent Labs  07/15/14 0812 07/16/14 0516  NA 138 139  K 3.7 3.7  CL 103 104  CO2 27 28  GLUCOSE 114* 95  BUN 7 8  CREATININE 0.74 0.66  CALCIUM 8.3* 8.2*   PT/INR No results for input(s): LABPROT, INR in the last 72 hours. Comprehensive Metabolic Panel:    Component Value Date/Time   NA 139 07/16/2014 0516   NA 138 07/15/2014 0812   NA 138 10/17/2011 1511   K 3.7 07/16/2014 0516   K 3.7 07/15/2014 0812   K 3.9 10/17/2011 1511   CL 104 07/16/2014 0516   CL 103 07/15/2014 0812   CO2 28 07/16/2014 0516   CO2 27 07/15/2014 0812   BUN 8 07/16/2014 0516   BUN 7 07/15/2014 0812   BUN 6 10/17/2011 1511   CREATININE 0.66 07/16/2014 0516   CREATININE 0.74 07/15/2014 0812   CREATININE 0.78 10/17/2011 1511   GLUCOSE 95 07/16/2014 0516   GLUCOSE 114* 07/15/2014 0812   CALCIUM 8.2* 07/16/2014 0516   CALCIUM 8.3* 07/15/2014 0812   CALCIUM 9.5 10/17/2011 1511   AST 12 07/13/2014 0420   AST 17 07/12/2014 1512   AST 12 10/17/2011 1511   ALT 16 07/13/2014 0420   ALT 20 07/12/2014 1512   ALKPHOS 72 07/13/2014 0420   ALKPHOS 91 07/12/2014 1512   ALKPHOS 77 10/17/2011 1511   BILITOT 0.6 07/13/2014 0420    BILITOT 0.4 07/12/2014 1512   BILITOT 0.3 10/17/2011 1511   PROT 7.2 07/13/2014 0420   PROT 8.4* 07/12/2014 1512   ALBUMIN 3.2* 07/13/2014 0420   ALBUMIN 4.0 07/12/2014 1512     Studies/Results: No results found.  Anti-infectives: Anti-infectives    Start     Dose/Rate Route Frequency Ordered Stop   07/16/14 2200  amoxicillin-clavulanate (AUGMENTIN) 875-125 MG per tablet 1 tablet     1 tablet Oral Every 12 hours 07/16/14 1631     07/12/14 1900  piperacillin-tazobactam (ZOSYN) IVPB 3.375 g  Status:  Discontinued     3.375 g 12.5 mL/hr over 240 Minutes Intravenous Every 8 hours 07/12/14 1850 07/16/14 1631      Assessment Acute cholecystitis, cholelithiasis s/p Attempted lap chole converted to open cholecystectomy; 07/13/14, Dr. Darnell Levelodd Gerkin -IV antibiotics -Ambulate and IS -Day #6 Zosyn -Tolerating diet -  Skin ulceration (under panus) - clean and almost healed. Hx of CVA with right hemiparesis Hypertension  Hx of seizures Hx of depresion Hx of asthma Degenerative disc disease DVT prophylaxis - Lovenox/SCD   Plan: Discharge.  Instructions given to her.  Will need rolling walker for home.:    Patty [name] J 07/17/2014

## 2014-07-17 NOTE — Care Management Note (Signed)
    Page 1 of 1   07/17/2014     12:51:28 PM CARE MANAGEMENT NOTE 07/17/2014  Patient:  Patty OppenheimMARK,Patty [name]   Account Number:  [id number]  Date Initiated:  07/17/2014  Documentation initiated by:  Lanier ClamMAHABIR,Shirlette Scarber  Subjective/Objective Assessment:   49 y/o f admitted w/cholecystitis.     Action/Plan:   from home.   Anticipated DC Date:  07/17/2014   Anticipated DC Plan:  HOME/SELF CARE      DC Planning Services  CM consult      Choice offered to / List presented to:     DME arranged  Levan HurstWALKER - ROLLING      DME agency  [facility]        Status of service:  Completed, signed off Medicare Important Message given?  YES (If response is "NO", the following Medicare IM given date fields will be blank) Date Medicare IM given:  07/17/2014 Medicare IM given by:  [facility]MAHABIR,Patty [name] Date Additional Medicare IM given:   Additional Medicare IM given by:    Discharge Disposition:  HOME/SELF CARE  Per UR Regulation:  Reviewed for med. necessity/level of care/duration of stay  If discussed at Long Length of Stay Meetings, dates discussed:    Comments:  07/17/14 Lanier ClamKathy Denzel Etienne RN BSN NCM [phone] PT recc HH. MD aware but does not want HHPT ordered.Will order RW.[facility] dme rep Fayrene FearingJames aware of order, & d/c, will bring to rm.Nsg notiied.

## 2014-07-20 NOTE — Discharge Summary (Signed)
Physician Discharge Summary  Patient ID: Patty OppenheimClara M Gras MRN: [id number] DOB/AGE: 50/30/1967 50 y.o.  Admit date: 07/12/2014 Discharge date: 07/20/2014  Admission Diagnoses:  Acute cholecystitis with cholelithiasis. Hx of CVA with right hemiparesis Hypertension Hx of seizures Hx of depresion Hx of asthma Degenerative disc disease  Discharge Diagnoses:  Acute cholecystitis, cholelithiasis. Hx of CVA with right hemiparesis Hypertension Hx of seizures Hx of depresion Hx of asthma Degenerative disc disease   Active Problems:   Cholecystitis with cholelithiasis   Cholelithiasis with acute cholecystitis   Acute cholecystitis   PROCEDURES: Attempted laparoscopic cholecystectomy. Open cholecystectomy; 07/13/14, Dr. Alean Rinneodd Gerkin   Hospital Course:  This is a 49 year old female who presents with a 2 day history of right upper quadrant abdominal pain, nausea, and vomiting. [facility]. An ultrasound showed cholelithiasis with a stone in the gallbladder neck. Her white blood count and liver function tests were normal. Her lipase was elevated. She was sent home. Because of continued abdominal pain today, her primary care physician [facility]. She describes the pain as sharp, intermittent, and in the right upper quadrant. She reports having intermittent similar attacks in the past. It does not refer any where else. Her nausea and vomiting have improved. She has a history of having have a CVA in the 1990s and has chronic slurred speech as well as right upper and lower extremity weakness. She denies chest pain or shortness of breath. Bowel movements have been normal. Pt was admitted and taken to the OR on 07/13/14.  It was very difficult and was converted from laparoscopic to an open procedure with drain placed at surgery Her post op course was slower than the usual in part to her open procedure, and also in part due to her pre op  condition.  She was seen by PT to help with ambulation, and walker was recommended.  Her diet was slowly advanced and she was discharged over the weekend by Dr. Abbey Chattersosenbower.    Condition on d/c:  Improved  CBC Latest Ref Rng 07/16/2014 07/15/2014 07/13/2014  WBC 4.0 - 10.5 K/uL 8.3 11.4(H) 18.8(H)  Hemoglobin 12.0 - 15.0 g/dL 10.7(L) 11.3(L) 12.5  Hematocrit 36.0 - 46.0 % 34.2(L) 36.1 39.8  Platelets 150 - 400 K/uL 284 283 320   CMP Latest Ref Rng 07/16/2014 07/15/2014 07/13/2014  Glucose 70 - 99 mg/dL 95 409(W114(H) -  BUN 6 - 23 mg/dL 8 7 -  Creatinine 1.190.50 - 1.10 mg/dL 1.470.66 8.290.74 5.620.81  Sodium 135 - 145 mmol/L 139 138 -  Potassium 3.5 - 5.1 mmol/L 3.7 3.7 -  Chloride 96 - 112 mmol/L 104 103 -  CO2 19 - 32 mmol/L 28 27 -  Calcium 8.4 - 10.5 mg/dL 8.2(L) 8.3(L) -  Total Protein 6.0 - 8.3 g/dL - - -  Albumin - - - -  Total Bilirubin 0.3 - 1.2 mg/dL - - -  Alkaline Phos 39 - 117 U/L - - -  AST 0 - 37 U/L - - -  ALT 0 - 35 U/L - - -    Disposition: 01-Home or Self Care  Discharge Instructions    Walker rolling    Complete by:  As directed             Medication List    STOP taking these medications        oxyCODONE-acetaminophen 5-325 MG per tablet  Commonly known as:  PERCOCET/ROXICET  TAKE these medications        amoxicillin-clavulanate 875-125 MG per tablet  Commonly known as:  AUGMENTIN  Take 1 tablet by mouth every 12 (twelve) hours.     budesonide-formoterol 80-4.5 MCG/ACT inhaler  Commonly known as:  SYMBICORT  Inhale 2 puffs into the lungs every 12 (twelve) hours as needed. Shortness of breath     divalproex 250 MG DR tablet  Commonly known as:  DEPAKOTE  Take 250 mg by mouth 3 (three) times daily.     EYE DROPS 0.05 % ophthalmic solution  Generic drug:  tetrahydrozoline  Place 1 drop into both eyes daily as needed. Dry Eyes     levalbuterol 45 MCG/ACT inhaler  Commonly known as:  XOPENEX HFA  Inhale 1-2 puffs into the lungs every 4 (four) hours as needed.  Shortness of breath     ondansetron 4 MG tablet  Commonly known as:  ZOFRAN  Take 1 tablet (4 mg total) by mouth every 6 (six) hours as needed for nausea.     oxyCODONE 5 MG immediate release tablet  Commonly known as:  Oxy IR/ROXICODONE  Take 1 tablet (5 mg total) by mouth every 4 (four) hours as needed for moderate pain.     pravastatin 20 MG tablet  Commonly known as:  PRAVACHOL  Take 20 mg by mouth at bedtime.     verapamil 120 MG tablet  Commonly known as:  CALAN  Take 1 tablet by mouth daily.           Follow-up Information    Follow up with [facility]   Why:  walker   Contact information:   [address] [place] [address] [phone]       Schedule an appointment as soon as possible for a visit with Velora Heckler, MD.   Specialty:  General Surgery   Why:  For staple removal   Contact information:   [address] [place] [address] [phone]       Signed: Sherrie George 07/20/2014, 2:03 PM

## 2014-08-09 DIAGNOSIS — J45909 Unspecified asthma, uncomplicated: Secondary | ICD-10-CM | POA: Diagnosis not present

## 2014-08-09 DIAGNOSIS — I10 Essential (primary) hypertension: Secondary | ICD-10-CM | POA: Diagnosis not present

## 2014-10-18 ENCOUNTER — Emergency Department (HOSPITAL_COMMUNITY): Payer: Medicare Other

## 2014-10-18 ENCOUNTER — Inpatient Hospital Stay (HOSPITAL_COMMUNITY)
Admission: EM | Admit: 2014-10-18 | Discharge: 2014-10-19 | DRG: 202 | Disposition: A | Discharge status: Home / Self Care | Payer: Medicare Other | Attending: Family Medicine | Admitting: Family Medicine

## 2014-10-18 ENCOUNTER — Encounter (HOSPITAL_COMMUNITY): Payer: Self-pay

## 2014-10-18 DIAGNOSIS — R739 Hyperglycemia, unspecified: Secondary | ICD-10-CM | POA: Diagnosis not present

## 2014-10-18 DIAGNOSIS — J9601 Acute respiratory failure with hypoxia: Secondary | ICD-10-CM

## 2014-10-18 DIAGNOSIS — Z823 Family history of stroke: Secondary | ICD-10-CM

## 2014-10-18 DIAGNOSIS — G40909 Epilepsy, unspecified, not intractable, without status epilepticus: Secondary | ICD-10-CM | POA: Diagnosis present

## 2014-10-18 DIAGNOSIS — R0602 Shortness of breath: Secondary | ICD-10-CM

## 2014-10-18 DIAGNOSIS — I10 Essential (primary) hypertension: Secondary | ICD-10-CM | POA: Diagnosis present

## 2014-10-18 DIAGNOSIS — I69351 Hemiplegia and hemiparesis following cerebral infarction affecting right dominant side: Secondary | ICD-10-CM | POA: Diagnosis not present

## 2014-10-18 DIAGNOSIS — E785 Hyperlipidemia, unspecified: Secondary | ICD-10-CM | POA: Diagnosis present

## 2014-10-18 DIAGNOSIS — J9621 Acute and chronic respiratory failure with hypoxia: Secondary | ICD-10-CM

## 2014-10-18 DIAGNOSIS — J45901 Unspecified asthma with (acute) exacerbation: Principal | ICD-10-CM

## 2014-10-18 DIAGNOSIS — R069 Unspecified abnormalities of breathing: Secondary | ICD-10-CM | POA: Diagnosis not present

## 2014-10-18 HISTORY — DX: Acute respiratory failure with hypoxia: J96.01

## 2014-10-18 LAB — CBC WITH DIFFERENTIAL/PLATELET
BASOS PCT: 0 % (ref 0–1)
Basophils Absolute: 0 10*3/uL (ref 0.0–0.1)
EOS ABS: 0.9 10*3/uL — AB (ref 0.0–0.7)
Eosinophils Relative: 9 % — ABNORMAL HIGH (ref 0–5)
HCT: 39 % (ref 36.0–46.0)
Hemoglobin: 12.4 g/dL (ref 12.0–15.0)
LYMPHS ABS: 3.6 10*3/uL (ref 0.7–4.0)
Lymphocytes Relative: 39 % (ref 12–46)
MCH: 29.8 pg (ref 26.0–34.0)
MCHC: 31.8 g/dL (ref 30.0–36.0)
MCV: 93.8 fL (ref 78.0–100.0)
MONOS PCT: 7 % (ref 3–12)
Monocytes Absolute: 0.6 10*3/uL (ref 0.1–1.0)
NEUTROS ABS: 4.2 10*3/uL (ref 1.7–7.7)
Neutrophils Relative %: 45 % (ref 43–77)
Platelets: 268 10*3/uL (ref 150–400)
RBC: 4.16 MIL/uL (ref 3.87–5.11)
RDW: 13.8 % (ref 11.5–15.5)
WBC: 9.2 10*3/uL (ref 4.0–10.5)

## 2014-10-18 LAB — COMPREHENSIVE METABOLIC PANEL
ALBUMIN: 3.8 g/dL (ref 3.5–5.0)
ALK PHOS: 88 U/L (ref 38–126)
ALT: 15 U/L (ref 14–54)
ANION GAP: 10 (ref 5–15)
AST: 16 U/L (ref 15–41)
BILIRUBIN TOTAL: 0.3 mg/dL (ref 0.3–1.2)
BUN: 9 mg/dL (ref 6–20)
CHLORIDE: 105 mmol/L (ref 101–111)
CO2: 25 mmol/L (ref 22–32)
Calcium: 8.5 mg/dL — ABNORMAL LOW (ref 8.9–10.3)
Creatinine, Ser: 0.91 mg/dL (ref 0.44–1.00)
GFR calc Af Amer: >60 mL/min (ref >60)
GFR calc non Af Amer: >60 mL/min (ref >60)
Glucose, Bld: 117 mg/dL — ABNORMAL HIGH (ref 65–99)
POTASSIUM: 3.6 mmol/L (ref 3.5–5.1)
Sodium: 140 mmol/L (ref 135–145)
Total Protein: 8 g/dL (ref 6.5–8.1)

## 2014-10-18 MED ORDER — ENOXAPARIN SODIUM 40 MG/0.4ML ~~LOC~~ SOLN
40.0000 mg | SUBCUTANEOUS | Status: DC
  Administered 2014-10-18 – 2014-10-19 (×2): 40 mg via SUBCUTANEOUS
  Filled 2014-10-18 (×2): qty 0.4

## 2014-10-18 MED ORDER — GUAIFENESIN ER 600 MG PO TB12
1200.0000 mg | ORAL_TABLET | Freq: Two times a day (BID) | ORAL | Status: DC
  Administered 2014-10-18 – 2014-10-19 (×3): 1200 mg via ORAL
  Filled 2014-10-18 (×3): qty 2

## 2014-10-18 MED ORDER — ACETAMINOPHEN 650 MG RE SUPP: 650.0000 mg | Freq: Four times a day (QID) | RECTAL | Status: DC | PRN

## 2014-10-18 MED ORDER — ALBUTEROL SULFATE (2.5 MG/3ML) 0.083% IN NEBU
2.5000 mg | INHALATION_SOLUTION | RESPIRATORY_TRACT | Status: DC
  Administered 2014-10-18 – 2014-10-19 (×5): 2.5 mg via RESPIRATORY_TRACT
  Filled 2014-10-18 (×5): qty 3

## 2014-10-18 MED ORDER — LEVALBUTEROL TARTRATE 45 MCG/ACT IN AERO: 1.0000 | INHALATION_SPRAY | RESPIRATORY_TRACT | Status: DC | PRN

## 2014-10-18 MED ORDER — ALBUTEROL SULFATE (2.5 MG/3ML) 0.083% IN NEBU
2.5000 mg | INHALATION_SOLUTION | Freq: Four times a day (QID) | RESPIRATORY_TRACT | Status: DC
  Administered 2014-10-18: 2.5 mg via RESPIRATORY_TRACT

## 2014-10-18 MED ORDER — BUDESONIDE-FORMOTEROL FUMARATE 80-4.5 MCG/ACT IN AERO
2.0000 | INHALATION_SPRAY | Freq: Two times a day (BID) | RESPIRATORY_TRACT | Status: DC
  Administered 2014-10-18 – 2014-10-19 (×2): 2 via RESPIRATORY_TRACT
  Filled 2014-10-18: qty 6.9

## 2014-10-18 MED ORDER — DEXTROSE 5 % IV SOLN
500.0000 mg | INTRAVENOUS | Status: DC
  Administered 2014-10-18 – 2014-10-19 (×2): 500 mg via INTRAVENOUS
  Filled 2014-10-18 (×3): qty 500

## 2014-10-18 MED ORDER — SODIUM CHLORIDE 0.9 % IJ SOLN
3.0000 mL | Freq: Two times a day (BID) | INTRAMUSCULAR | Status: DC
  Administered 2014-10-18 – 2014-10-19 (×3): 3 mL via INTRAVENOUS

## 2014-10-18 MED ORDER — OXYCODONE HCL 5 MG PO TABS: 5.0000 mg | ORAL_TABLET | ORAL | Status: DC | PRN

## 2014-10-18 MED ORDER — MAGNESIUM SULFATE 2 GM/50ML IV SOLN
2.0000 g | Freq: Once | INTRAVENOUS | Status: AC
  Administered 2014-10-18: 2 g via INTRAVENOUS
  Filled 2014-10-18: qty 50

## 2014-10-18 MED ORDER — ONDANSETRON HCL 4 MG PO TABS
4.0000 mg | ORAL_TABLET | Freq: Four times a day (QID) | ORAL | Status: DC | PRN
  Administered 2014-10-19: 4 mg via ORAL
  Filled 2014-10-18: qty 1

## 2014-10-18 MED ORDER — ALBUTEROL SULFATE (2.5 MG/3ML) 0.083% IN NEBU
2.5000 mg | INHALATION_SOLUTION | RESPIRATORY_TRACT | Status: AC | PRN
  Filled 2014-10-18: qty 3

## 2014-10-18 MED ORDER — DIVALPROEX SODIUM 250 MG PO DR TAB
250.0000 mg | DELAYED_RELEASE_TABLET | Freq: Three times a day (TID) | ORAL | Status: DC
  Administered 2014-10-18 – 2014-10-19 (×5): 250 mg via ORAL
  Filled 2014-10-18 (×5): qty 1

## 2014-10-18 MED ORDER — METHYLPREDNISOLONE SODIUM SUCC 125 MG IJ SOLR
80.0000 mg | Freq: Three times a day (TID) | INTRAMUSCULAR | Status: DC
  Administered 2014-10-18 – 2014-10-19 (×5): 80 mg via INTRAVENOUS
  Filled 2014-10-18 (×5): qty 2

## 2014-10-18 MED ORDER — ALBUTEROL (5 MG/ML) CONTINUOUS INHALATION SOLN
10.0000 mg/h | INHALATION_SOLUTION | RESPIRATORY_TRACT | Status: DC
  Administered 2014-10-18: 10 mg/h via RESPIRATORY_TRACT
  Filled 2014-10-18: qty 20

## 2014-10-18 MED ORDER — PRAVASTATIN SODIUM 10 MG PO TABS
20.0000 mg | ORAL_TABLET | Freq: Every day | ORAL | Status: DC
  Administered 2014-10-18: 20 mg via ORAL
  Filled 2014-10-18: qty 2

## 2014-10-18 MED ORDER — ACETAMINOPHEN 325 MG PO TABS: 650.0000 mg | ORAL_TABLET | Freq: Four times a day (QID) | ORAL | Status: DC | PRN

## 2014-10-18 MED ORDER — VERAPAMIL HCL 120 MG PO TABS
120.0000 mg | ORAL_TABLET | Freq: Every day | ORAL | Status: DC
  Administered 2014-10-18 – 2014-10-19 (×2): 120 mg via ORAL
  Filled 2014-10-18 (×2): qty 1

## 2014-10-18 NOTE — Progress Notes (Signed)
This patient was admitted to the hospital earlier this morning by Dr. Selena BattenKim  Patient seen and examined. She still feels short of breath and has a productive cough. She has bilateral expiratory wheezes.  She was admitted to the hospital for exacerbation of asthma. Will continue on bronchodilators, intravenous steroids. She is still requiring a fair amount of oxygen, will titrate down as tolerated. Anticipate discharge home in the next 1-2 days as her condition improves.  Patty [name],Patty [name]

## 2014-10-18 NOTE — Evaluation (Signed)
Physical Therapy Evaluation Patient Details Name: Patty [name] MRN: [id number] DOB: [birth year] Today's Date: 10/18/2014   History of Present Illness  HPI: 49 yo female with asthma apparently c/o dyspnea for the past few days. Pt notes wheezing. + cough with green sputum. Denies fever, chills, cp, palp, n/v, diarrhea, brbpr, black stool. Pt presented to ED due to increase in sob. Pt had CXR negative. Pt will be admitted for asthma exacerbation.   She sustained a stroke in 1990 with resultant right hemiparesis and mild slurring of speech.  She states that recently she has been falling intermittently and her speech is slurred more of the time.  She lives with her daughter.  Clinical Impression   Pt was seen for evaluation.  She was alert and very oriented, definite slurring of the speech.  She was on 3 L O2 at the time of my arrival but does not use supplemental O2 at home.  On room air at rest and with gait her O2 sat remained at 98%.  She has functional strength in the right extremities but decreased coordination and has increased tone in the RLE.  Her gait today without assistive device was stable and WNL.  She describes some of her falls being on grassy areas which will be inherently dangerous for her.  Her dynamic standing balance is mildly deficient and I have offered OP PT to her if she would like to work on increasing strength and balance.  She would like to talk with her daughter about this first.  I have recommended that she focus on her gait when walking and to avoid grassy or gravel areas.  She understands this.    Follow Up Recommendations Outpatient PT    Equipment Recommendations  None recommended by PT    Recommendations for Other Services   none    Precautions / Restrictions Precautions Precautions: None Restrictions Weight Bearing Restrictions: No      Mobility  Bed Mobility Overal bed mobility: Modified Independent                Transfers Overall transfer  level: Modified independent Equipment used: None                Ambulation/Gait Ambulation/Gait assistance: Modified independent (Device/Increase time) Ambulation Distance (Feet): 225 Feet (also 80' x 1 with a walker) Assistive device: None Gait Pattern/deviations: Decreased dorsiflexion - right;Decreased dorsiflexion - left   Gait velocity interpretation: >2.62 ft/sec, indicative of independent community ambulator General Gait Details: no major gait abnormalities evident today.Marland KitchenMarland KitchenI would guess that her right leg fatigues quickly and this is when she is at more risk for falling  Stairs            Wheelchair Mobility    Modified Rankin (Stroke Patients Only)       Balance Overall balance assessment: Needs assistance Sitting-balance support: No upper extremity supported;Feet supported Sitting balance-Leahy Scale: Normal     Standing balance support: No upper extremity supported;During functional activity Standing balance-Leahy Scale: Good Standing balance comment: pt is able to tolerate simple challenge to gait with quick stops, head turns, etc.                             Pertinent Vitals/Pain Pain Assessment: No/denies pain    Home Living Family/patient expects to be discharged to:: Private residence Living Arrangements: Children Available Help at Discharge: Family Type of Home: House Home Access: Stairs to enter Entrance Stairs-Rails: Right  Entrance Stairs-Number of Steps: 3 Home Layout: One level Home Equipment: Walker - 2 wheels      Prior Function Level of Independence: Independent         Comments: pt has a walker but prefers to "furniture walk"     Hand Dominance   Dominant Hand: Right    Extremity/Trunk Assessment               Lower Extremity Assessment: Generalized weakness;RLE deficits/detail RLE Deficits / Details: old hemiparesis with increased tone, clonus present...hamstrings are very tight with SLR to 30  degrees...heel shin slide is fair    Cervical / Trunk Assessment: Normal  Communication   Communication: Expressive difficulties (slurring of speech)  Cognition Arousal/Alertness: Awake/alert Behavior During Therapy: WFL for tasks assessed/performed Overall Cognitive Status: Within Functional Limits for tasks assessed                      General Comments      Exercises        Assessment/Plan    PT Assessment All further PT needs can be met in the next venue of care  PT Diagnosis  (decreased dynamic standing balance, intermittent)   PT Problem List Decreased balance  PT Treatment Interventions     PT Goals (Current goals can be found in the Care Plan section) Acute Rehab PT Goals PT Goal Formulation: All assessment and education complete, DC therapy    Frequency     Barriers to discharge        Co-evaluation               End of Session Equipment Utilized During Treatment: Gait belt Activity Tolerance: Patient tolerated treatment well Patient left: in bed;with call bell/phone within reach Nurse Communication: Mobility status    Functional Assessment Tool Used: clinical judgement Functional Limitation: Mobility: Walking and moving around Mobility: Walking and Moving Around Current Status ([id number]): At least 1 percent but less than 20 percent impaired, limited or restricted Mobility: Walking and Moving Around Goal Status [phone]): At least 1 percent but less than 20 percent impaired, limited or restricted Mobility: Walking and Moving Around Discharge Status [phone]): At least 1 percent but less than 20 percent impaired, limited or restricted    Time: 1440-1515 PT Time Calculation (min) (ACUTE ONLY): 35 min   Charges:   PT Evaluation $Initial PT Evaluation Tier I: 1 Procedure     PT G Codes:   PT G-Codes **NOT FOR INPATIENT CLASS** Functional Assessment Tool Used: clinical judgement Functional Limitation: Mobility: Walking and moving  around Mobility: Walking and Moving Around Current Status (E0923): At least 1 percent but less than 20 percent impaired, limited or restricted Mobility: Walking and Moving Around Goal Status [phone]): At least 1 percent but less than 20 percent impaired, limited or restricted Mobility: Walking and Moving Around Discharge Status [phone]): At least 1 percent but less than 20 percent impaired, limited or restricted    Sable Feil  PT 10/18/2014, 3:40 PM [phone]

## 2014-10-18 NOTE — ED Provider Notes (Signed)
CSN: [id number]     Arrival date & time 10/18/14  0148 History   First MD Initiated Contact with Patient 10/18/14 0153     Chief Complaint  Patient presents with  . Shortness of Breath     (Consider location/radiation/quality/duration/timing/severity/associated sxs/prior Treatment) HPI Patient h. No new lower extremity swelling. as a history of asthma and presents with increased shortness of breath for the past 3 days. With a dry nonproductive cough. No fever or chills. States he's been using her nebulizer at home with little improvement. He called out to residence this afternoon by EMS. Was given breathing treatment but refused transport patient. Presents with worsening shortness of breath this evening. Given nebulized treatment and Solu-Medrol 125 mg by EMS en route. Mild improvement of her symptoms. Denies any pain including chest pain. Past Medical History  Diagnosis Date  . Stroke 1990    chronic right hemiparesis  . Asthma   . Hyperlipidemia   . HTN (hypertension)   . Depression   . Seizures     remote  . DDD (degenerative disc disease), lumbar    Past Surgical History  Procedure Laterality Date  . Partial hysterectomy    . Cesarean section    . Colonoscopy  12/24/2011    Procedure: COLONOSCOPY;  Surgeon: West Bali, MD;  Location: [facility];  Service: Endoscopy;  Laterality: N/A;  2:00  . Cholecystectomy N/A 07/13/2014    Procedure: LAPAROSCOPIC CONVERTED TO OPEN  CHOLECYSTECTOMY ;  Surgeon: Darnell Level, MD;  Location: [facility];  Service: General;  Laterality: N/A;   Family History  Problem Relation Age of Onset  . Colon cancer Neg Hx   . Liver disease Neg Hx    History  Substance Use Topics  . Smoking status: Never Smoker   . Smokeless tobacco: Not on file  . Alcohol Use: Yes     Comment: couple of drinks on weekends   OB History    No data available     Review of Systems  Constitutional: Negative for fever and chills.  Respiratory: Positive for cough,  shortness of breath and wheezing.   Cardiovascular: Negative for chest pain, palpitations and leg swelling.  Gastrointestinal: Negative for nausea, vomiting and abdominal pain.  Musculoskeletal: Negative for back pain, neck pain and neck stiffness.  Skin: Negative for rash and wound.  Neurological: Negative for dizziness, weakness, light-headedness, numbness and headaches.  All other systems reviewed and are negative.     Allergies  Review of patient's allergies indicates no known allergies.  Home Medications   Prior to Admission medications   Medication Sig Start Date End Date Taking? Authorizing Provider  amoxicillin-clavulanate (AUGMENTIN) 875-125 MG per tablet Take 1 tablet by mouth every 12 (twelve) hours. 07/17/14   Avel Peace, MD  budesonide-formoterol [facility]) 80-4.5 MCG/ACT inhaler Inhale 2 puffs into the lungs every 12 (twelve) hours as needed. Shortness of breath    Historical Provider, MD  divalproex (DEPAKOTE) 250 MG DR tablet Take 250 mg by mouth 3 (three) times daily.    Historical Provider, MD  levalbuterol [facility] HFA) 45 MCG/ACT inhaler Inhale 1-2 puffs into the lungs every 4 (four) hours as needed. Shortness of breath    Historical Provider, MD  ondansetron (ZOFRAN) 4 MG tablet Take 1 tablet (4 mg total) by mouth every 6 (six) hours as needed for nausea. 07/17/14   Avel Peace, MD  oxyCODONE (OXY IR/ROXICODONE) 5 MG immediate release tablet Take 1 tablet (5 mg total) by mouth every 4 (  four) hours as needed for moderate pain. 07/17/14   Avel Peaceodd Rosenbower, MD  pravastatin (PRAVACHOL) 20 MG tablet Take 20 mg by mouth at bedtime.     Historical Provider, MD  tetrahydrozoline (EYE DROPS) 0.05 % ophthalmic solution Place 1 drop into both eyes daily as needed. Dry Eyes    Historical Provider, MD  verapamil (CALAN) 120 MG tablet Take 1 tablet by mouth daily. 09/23/13   Historical Provider, MD   BP 130/76 mmHg  Pulse 95  Resp 25  SpO2 97% Physical Exam   Constitutional: She is oriented to person, place, and time. She appears well-developed and well-nourished. She appears distressed.  HENT:  Head: Normocephalic and atraumatic.  Mouth/Throat: Oropharynx is clear and moist.  Eyes: EOM are normal. Pupils are equal, round, and reactive to light.  Neck: Normal range of motion. Neck supple.  Cardiovascular: Normal rate and regular rhythm.   Pulmonary/Chest: She is in respiratory distress. She has wheezes (inspiratory and expiratory wheezing.). She has no rales.  Increased work of breathing  Abdominal: Soft. Bowel sounds are normal. She exhibits no distension and no mass. There is no tenderness. There is no rebound and no guarding.  Musculoskeletal: Normal range of motion. She exhibits no edema or tenderness.  No calf swelling or tenderness.  Neurological: She is alert and oriented to person, place, and time.  Skin: Skin is warm and dry. No rash noted. No erythema.  Psychiatric: She has a normal mood and affect. Her behavior is normal.  Nursing note and vitals reviewed.   ED Course  Procedures (including critical care time) Labs Review Labs Reviewed  CBC WITH DIFFERENTIAL/PLATELET - Abnormal; Notable for the following:    Eosinophils Relative 9 (*)    Eosinophils Absolute 0.9 (*)    All other components within normal limits  COMPREHENSIVE METABOLIC PANEL - Abnormal; Notable for the following:    Glucose, Bld 117 (*)    Calcium 8.5 (*)    All other components within normal limits    Imaging Review Dg Chest Port 1 View  10/18/2014   CLINICAL DATA:  49 year old female with shortness of breath  EXAM: PORTABLE CHEST - 1 VIEW  COMPARISON:  Chest radiograph dated 07/12/2014  FINDINGS: The heart size and mediastinal contours are within normal limits. Both lungs are clear. The visualized skeletal structures are unremarkable.  IMPRESSION: No active disease.   Electronically Signed   By: Elgie CollardArash  Radparvar M.D.   On: 10/18/2014 02:24     EKG  Interpretation None      MDM   Final diagnoses:  SOB (shortness of breath)  Asthma exacerbation    We'll give continuous nebulized treatment and magnesium in the emergency department. Low threshold for starting patient on BiPAP.  Patient states she is feeling better though she still short of breath with both inspiratory and expiratory wheezes. We'll discuss admission with hospitalist.  Discussed with Dr. Selena BattenKim. Will admit to observation telemetry bed.  Loren Raceravid Aviyah Swetz, MD 10/18/14 [phone]

## 2014-10-18 NOTE — Care Management Note (Signed)
Case Management Note  Patient Details  Name: Patty [name] MRN: [id number] Date of Birth: 10/16/1965  Subjective/Objective:                  Pt admitted from home with asthma. Pt lives with her daughter and will return home at discharge. Pt requires some assist with ADL's. Pt has a walker for home use.  Action/Plan: Will obtain PT consult as pt reports frequent falls at home. Will continue to follow for discharge planning needs.  Expected Discharge Date:                  Expected Discharge Plan:  Home/Self Care  In-House Referral:  NA  Discharge planning Services  CM Consult  Post Acute Care Choice:  NA Choice offered to:  NA  DME Arranged:    DME Agency:     HH Arranged:    HH Agency:     Status of Service:  In process, will continue to follow  Medicare Important Message Given:    Date Medicare IM Given:    Medicare IM give by:    Date Additional Medicare IM Given:    Additional Medicare Important Message give by:     If discussed at Long Length of Stay Meetings, dates discussed:    Additional Comments:  Cheryl FlashBlackwell, [name], RN 10/18/2014, 1:33 PM

## 2014-10-18 NOTE — H&P (Signed)
Patty [name] is an 49 y.o. female.    Dr. Iona Beard (pcp)  Chief Complaint: dyspnea HPI: 49 yo female with asthma apparently c/o dyspnea for the past few days.  Pt notes wheezing. + cough with green sputum.   Denies fever, chills, cp, palp, n/v, diarrhea, brbpr, black stool.  Pt presented to ED due to increase in sob.  Pt had CXR negative.  Pt will be admitted for asthma exacerbation.   Past Medical History  Diagnosis Date  . Stroke 1990    chronic right hemiparesis  . Asthma   . Hyperlipidemia   . HTN (hypertension)   . Depression   . Seizures     remote  . DDD (degenerative disc disease), lumbar     Past Surgical History  Procedure Laterality Date  . Partial hysterectomy    . Cesarean section    . Colonoscopy  12/24/2011    Procedure: COLONOSCOPY;  Surgeon: Danie Binder, MD;  [facility];  Service: Endoscopy;  Laterality: N/A;  2:00  . Cholecystectomy N/A 07/13/2014    Procedure: LAPAROSCOPIC CONVERTED TO OPEN  CHOLECYSTECTOMY ;  Surgeon: Armandina Gemma, MD;  Location: [facility];  Service: General;  Laterality: N/A;    Family History  Problem Relation Age of Onset  . Colon cancer Neg Hx   . Liver disease Neg Hx   . Stroke Father    Social History:  reports that she has never smoked. She does not have any smokeless tobacco history on file. She reports that she drinks about 1.2 oz of alcohol per week. She reports that she does not use illicit drugs.  Allergies: No Known Allergies Medications reviewed   Results for orders placed or performed during the hospital encounter of 10/18/14 (from the past 48 hour(s))  CBC with Differential/Platelet     Status: Abnormal   Collection Time: 10/18/14  1:58 AM  Result Value Ref Range   WBC 9.2 4.0 - 10.5 K/uL   RBC 4.16 3.87 - 5.11 MIL/uL   Hemoglobin 12.4 12.0 - 15.0 g/dL   HCT 39.0 36.0 - 46.0 %   MCV 93.8 78.0 - 100.0 fL   MCH 29.8 26.0 - 34.0 pg   MCHC 31.8 30.0 - 36.0 g/dL   RDW 13.8 11.5 - 15.5 %   Platelets 268  150 - 400 K/uL   Neutrophils Relative % 45 43 - 77 %   Neutro Abs 4.2 1.7 - 7.7 K/uL   Lymphocytes Relative 39 12 - 46 %   Lymphs Abs 3.6 0.7 - 4.0 K/uL   Monocytes Relative 7 3 - 12 %   Monocytes Absolute 0.6 0.1 - 1.0 K/uL   Eosinophils Relative 9 (H) 0 - 5 %   Eosinophils Absolute 0.9 (H) 0.0 - 0.7 K/uL   Basophils Relative 0 0 - 1 %   Basophils Absolute 0.0 0.0 - 0.1 K/uL  Comprehensive metabolic panel     Status: Abnormal   Collection Time: 10/18/14  1:58 AM  Result Value Ref Range   Sodium 140 135 - 145 mmol/L   Potassium 3.6 3.5 - 5.1 mmol/L   Chloride 105 101 - 111 mmol/L   CO2 25 22 - 32 mmol/L   Glucose, Bld 117 (H) 65 - 99 mg/dL   BUN 9 6 - 20 mg/dL   Creatinine, Ser 0.91 0.44 - 1.00 mg/dL   Calcium 8.5 (L) 8.9 - 10.3 mg/dL   Total Protein 8.0 6.5 - 8.1 g/dL   Albumin  3.8 3.5 - 5.0 g/dL   AST 16 15 - 41 U/L   ALT 15 14 - 54 U/L   Alkaline Phosphatase 88 38 - 126 U/L   Total Bilirubin 0.3 0.3 - 1.2 mg/dL   GFR calc non Af Amer >60 >60 mL/min   GFR calc Af Amer >60 >60 mL/min    Comment: (NOTE) The eGFR has been calculated using the CKD EPI equation. This calculation has not been validated in all clinical situations. eGFR's persistently <60 mL/min signify possible Chronic Kidney Disease.    Anion gap 10 5 - 15   Dg Chest Port 1 View  10/18/2014   CLINICAL DATA:  49 year old female with shortness of breath  EXAM: PORTABLE CHEST - 1 VIEW  COMPARISON:  Chest radiograph dated 07/12/2014  FINDINGS: The heart size and mediastinal contours are within normal limits. Both lungs are clear. The visualized skeletal structures are unremarkable.  IMPRESSION: No active disease.   Electronically Signed   By: Anner Crete M.D.   On: 10/18/2014 02:24    ROS  Blood pressure 137/74, pulse 92, resp. rate 24, SpO2 97 %. Physical Exam  Constitutional: She is oriented to person, place, and time. She appears well-developed and well-nourished.  HENT:  Head: Normocephalic and  atraumatic.  Mouth/Throat: No oropharyngeal exudate.  Eyes: Conjunctivae and EOM are normal. Pupils are equal, round, and reactive to light. No scleral icterus.  Neck: Normal range of motion. Neck supple. No JVD present. No tracheal deviation present. No thyromegaly present.  Cardiovascular: Normal rate and regular rhythm.  Exam reveals no gallop and no friction rub.   No murmur heard. Respiratory: She is in respiratory distress. She has wheezes. She has no rales. She exhibits no tenderness.  GI: Soft. Bowel sounds are normal. She exhibits no distension. There is no tenderness. There is no rebound and no guarding.  Musculoskeletal: Normal range of motion. She exhibits no edema or tenderness.  Lymphadenopathy:    She has no cervical adenopathy.  Neurological: She is alert and oriented to person, place, and time. She has normal reflexes. She displays normal reflexes. No cranial nerve deficit. She exhibits normal muscle tone. Coordination normal.  Skin: Skin is warm and dry. No rash noted. No erythema. No pallor.  Psychiatric: She has a normal mood and affect. Her behavior is normal. Judgment and thought content normal.     Assessment/Plan Asthma exacerbation Solumedrol 43m iv q8h zithromax iv Cont symbicort Albuterol neb 1 neb po q6h  Albuterol hfa prn  Hyperglycemia Check hga1c  DVT prophylaxis: scd, lovenox  Patty [name] 10/18/2014, 4:21 AM

## 2014-10-18 NOTE — ED Notes (Signed)
C/o shortness of breath all day today. No relief with home breathing treatments. Patient denies pain

## 2014-10-19 DIAGNOSIS — J45901 Unspecified asthma with (acute) exacerbation: Principal | ICD-10-CM

## 2014-10-19 LAB — CBC
HEMATOCRIT: 38.7 % (ref 36.0–46.0)
Hemoglobin: 12.1 g/dL (ref 12.0–15.0)
MCH: 29.5 pg (ref 26.0–34.0)
MCHC: 31.3 g/dL (ref 30.0–36.0)
MCV: 94.4 fL (ref 78.0–100.0)
Platelets: 309 10*3/uL (ref 150–400)
RBC: 4.1 MIL/uL (ref 3.87–5.11)
RDW: 13.8 % (ref 11.5–15.5)
WBC: 19.8 10*3/uL — AB (ref 4.0–10.5)

## 2014-10-19 LAB — COMPREHENSIVE METABOLIC PANEL
ALBUMIN: 3.5 g/dL (ref 3.5–5.0)
ALT: 14 U/L (ref 14–54)
AST: 16 U/L (ref 15–41)
Alkaline Phosphatase: 87 U/L (ref 38–126)
Anion gap: 7 (ref 5–15)
BILIRUBIN TOTAL: 0.2 mg/dL — AB (ref 0.3–1.2)
BUN: 14 mg/dL (ref 6–20)
CHLORIDE: 105 mmol/L (ref 101–111)
CO2: 24 mmol/L (ref 22–32)
CREATININE: 0.68 mg/dL (ref 0.44–1.00)
Calcium: 9 mg/dL (ref 8.9–10.3)
GFR calc non Af Amer: >60 mL/min (ref >60)
Glucose, Bld: 164 mg/dL — ABNORMAL HIGH (ref 65–99)
Potassium: 4.4 mmol/L (ref 3.5–5.1)
Sodium: 136 mmol/L (ref 135–145)
Total Protein: 7.6 g/dL (ref 6.5–8.1)

## 2014-10-19 MED ORDER — AZITHROMYCIN 250 MG PO TABS: 250.0000 mg | ORAL_TABLET | Freq: Every day | ORAL | Status: DC

## 2014-10-19 MED ORDER — ALBUTEROL SULFATE (2.5 MG/3ML) 0.083% IN NEBU: 2.5000 mg | INHALATION_SOLUTION | Freq: Four times a day (QID) | RESPIRATORY_TRACT | Status: DC

## 2014-10-19 MED ORDER — PREDNISONE 10 MG PO TABS: ORAL_TABLET | ORAL | Status: DC

## 2014-10-19 MED ORDER — PREDNISONE 20 MG PO TABS
40.0000 mg | ORAL_TABLET | Freq: Every day | ORAL | Status: DC
Start: 2014-10-20 — End: 2014-10-19

## 2014-10-19 NOTE — Progress Notes (Signed)
  PROGRESS NOTE  Patty OppenheimClara M [name] ZOX:[id number] DOB: 10/22/1965 DOA: 10/18/2014 PCP: Evlyn CourierHILL,GERALD K, MD  Summary: 49 year old woman with history of asthma presented with increased shortness of breath, wheezing, productive cough. Chest x-ray showed no acute disease. Admitted for asthma exacerbation  Assessment/Plan: 1. Asthma exacerbation. Appears resolved.    Doing well, asymptomatic.  Home today on steroid taper.  Brendia Sacksaniel Goodrich, MD  [facility]  Pager [phone] If 7PM-7AM, please contact night-coverage at [email or web address], password [email or web address] 10/19/2014, 3:38 PM  LOS: 1 day   Consultants:  PT: outpatient  Procedures:    Antibiotics:  Azithromycin 7/20 >> 7/25  HPI/Subjective: Feeling better, breathing better, eating fine. Wants to go home.  Objective: Filed Vitals:   10/19/14 0613 10/19/14 0731 10/19/14 1100 10/19/14 1529  BP: 118/70   119/74  Pulse: 90   87  Temp: 98.1 F (36.7 C)   98.4 F (36.9 C)  TempSrc: Oral   Oral  Resp:    20  Weight:      SpO2: 99% 96% 98% 98%    Intake/Output Summary (Last 24 hours) at 10/19/14 1538 Last data filed at 10/19/14 0800  Gross per 24 hour  Intake    240 ml  Output    800 ml  Net   -560 ml     Filed Weights   10/18/14 0556 10/19/14 0500  Weight: 99.2 kg (218 lb 11.1 oz) 99.701 kg (219 lb 12.8 oz)    Exam:     Afebrile, vital signs stable, no hypoxia. General:  Appears calm and comfortable Cardiovascular: RRR, no m/r/g. No LE edema. Telemetry: SR, no arrhythmias  Respiratory: CTA bilaterally, no w/r/r. Normal respiratory effort. Psychiatric: grossly normal mood and affect, speech fluent and appropriate  New data reviewed:  Complete metabolic panel unremarkable  WBC up to 19.8 (on steroids)  Chest x-ray independently reviewed, no acute disease.  Pertinent data since admission:    Pending data:    Scheduled Meds: . albuterol  2.5 mg Nebulization Q6H  . azithromycin  500 mg Intravenous Q24H  .  budesonide-formoterol  2 puff Inhalation BID  . divalproex  250 mg Oral TID  . enoxaparin (LOVENOX) injection  40 mg Subcutaneous Q24H  . guaiFENesin  1,200 mg Oral BID  . methylPREDNISolone (SOLU-MEDROL) injection  80 mg Intravenous 3 times per day  . pravastatin  20 mg Oral QHS  . sodium chloride  3 mL Intravenous Q12H  . verapamil  120 mg Oral Daily   Continuous Infusions: . albuterol Stopped (10/18/14 0300)    Principal Problem:   Asthma exacerbation Active Problems:   Hyperglycemia   Acute respiratory failure with hypoxia   Essential hypertension   Seizure disorder

## 2014-10-19 NOTE — Discharge Summary (Signed)
Physician Discharge Summary  Patty OppenheimClara M [name] [id number] DOB: [birth year] DOA: 10/18/2014  PCP: Evlyn CourierHILL,GERALD K, MD  Admit date: 10/18/2014 Discharge date: 10/19/2014  Recommendations for Outpatient Follow-up:  1. Asthma exacerbation.   Follow-up Information    Follow up with Evlyn CourierHILL,GERALD K, MD.   Specialty:  Family Medicine   Why:  As needed   Contact information:   [address] [place] [address] [phone]      Discharge Diagnoses:  1. Asthma exacerbation.  Discharge Condition: Improved. Disposition: Home.  Diet recommendation: Heart healthy.  Filed Weights   10/18/14 0556 10/19/14 0500  Weight: 99.2 kg (218 lb 11.1 oz) 99.701 kg (219 lb 12.8 oz)    History of present illness:  49 year old woman with history of asthma presented with increased shortness of breath, wheezing, productive cough. Chest x-ray showed no acute disease. Admitted for asthma exacerbation.  Hospital Course:  Patty [name] was treated with steroids, bronchodilators and rapidly improved. Her asthma exacerbation appears resolved at this point. Her hospitalization was uncomplicated and she is now stable for discharge home, and well complete a prednisone taper and oral antibiotics.   Discharge Instructions  Discharge Instructions    Activity as tolerated - No restrictions    Complete by:  As directed      Diet - low sodium heart healthy    Complete by:  As directed      Discharge instructions    Complete by:  As directed   Call your physician or seek immediate medical attention for fever, shortness of breath or worsening of condition          Current Discharge Medication List    START taking these medications   Details  azithromycin (ZITHROMAX) 250 MG tablet Take 1 tablet (250 mg total) by mouth daily. Qty: 3 tablet, Refills: 0    predniSONE (DELTASONE) 10 MG tablet Take 40 mg by mouth daily for3 days, then take 20 mg by mouth daily for 3 days, then take 10 mg by mouth daily for 3 days,  then stop. Qty: 21 tablet, Refills: 0      CONTINUE these medications which have NOT CHANGED   Details  budesonide-formoterol (SYMBICORT) 80-4.5 MCG/ACT inhaler Inhale 2 puffs into the lungs every 12 (twelve) hours as needed. Shortness of breath    divalproex (DEPAKOTE) 250 MG DR tablet Take 250 mg by mouth 2 (two) times daily.     levalbuterol (XOPENEX HFA) 45 MCG/ACT inhaler Inhale 1-2 puffs into the lungs every 4 (four) hours as needed. Shortness of breath    pravastatin (PRAVACHOL) 20 MG tablet Take 20 mg by mouth at bedtime.     promethazine (PHENERGAN) 25 MG tablet Take 25 mg by mouth every 6 (six) hours as needed for nausea or vomiting.    verapamil (CALAN) 120 MG tablet Take 1 tablet by mouth daily.    ondansetron (ZOFRAN) 4 MG tablet Take 1 tablet (4 mg total) by mouth every 6 (six) hours as needed for nausea. Qty: 30 tablet, Refills: 0    tetrahydrozoline (EYE DROPS) 0.05 % ophthalmic solution Place 1 drop into both eyes daily as needed. Dry Eyes      STOP taking these medications     amoxicillin-clavulanate (AUGMENTIN) 875-125 MG per tablet      oxyCODONE (OXY IR/ROXICODONE) 5 MG immediate release tablet        No Known Allergies  The results of significant diagnostics from this hospitalization (including imaging, microbiology, ancillary and laboratory) are listed  below for reference.    Significant Diagnostic Studies: Dg Chest Port 1 View  10/18/2014   CLINICAL DATA:  49 year old female with shortness of breath  EXAM: PORTABLE CHEST - 1 VIEW  COMPARISON:  Chest radiograph dated 07/12/2014  FINDINGS: The heart size and mediastinal contours are within normal limits. Both lungs are clear. The visualized skeletal structures are unremarkable.  IMPRESSION: No active disease.   Electronically Signed   By: Elgie Collard M.D.   On: 10/18/2014 02:24    Labs: Basic Metabolic Panel:  Recent Labs Lab 10/18/14 0158 10/19/14 0643  NA 140 136  K 3.6 4.4  CL 105 105    CO2 25 24  GLUCOSE 117* 164*  BUN 9 14  CREATININE 0.91 0.68  CALCIUM 8.5* 9.0   Liver Function Tests:  Recent Labs Lab 10/18/14 0158 10/19/14 0643  AST 16 16  ALT 15 14  ALKPHOS 88 87  BILITOT 0.3 0.2*  PROT 8.0 7.6  ALBUMIN 3.8 3.5   CBC:  Recent Labs Lab 10/18/14 0158 10/19/14 0643  WBC 9.2 19.8*  NEUTROABS 4.2  --   HGB 12.4 12.1  HCT 39.0 38.7  MCV 93.8 94.4  PLT 268 309    Principal Problem:   Asthma exacerbation Active Problems:   Hyperglycemia   Acute respiratory failure with hypoxia   Essential hypertension   Seizure disorder   Time coordinating discharge: 25 minutes  Signed:  Brendia Sacks, MD [facility] 10/19/2014, 4:12 PM

## 2014-10-19 NOTE — Progress Notes (Signed)
Patient discharged home.  IV removed - WNL. Reviewed medications and emphasized importance of completing whole dose of abx and steroids.  Instructed to follow up with PCP if needed.  Verbalizes understanding.  Stable to DC home, assisted off unit via WC

## 2014-11-08 DIAGNOSIS — E785 Hyperlipidemia, unspecified: Secondary | ICD-10-CM | POA: Diagnosis not present

## 2014-11-08 DIAGNOSIS — J45909 Unspecified asthma, uncomplicated: Secondary | ICD-10-CM | POA: Diagnosis not present

## 2014-11-24 ENCOUNTER — Emergency Department (HOSPITAL_COMMUNITY): Payer: Medicare Other

## 2014-11-24 ENCOUNTER — Encounter (HOSPITAL_COMMUNITY): Payer: Self-pay

## 2014-11-24 ENCOUNTER — Emergency Department (HOSPITAL_COMMUNITY)
Admission: EM | Admit: 2014-11-24 | Discharge: 2014-11-24 | Disposition: A | Discharge status: Home / Self Care | Payer: Medicare Other | Attending: Emergency Medicine | Admitting: Emergency Medicine

## 2014-11-24 DIAGNOSIS — I10 Essential (primary) hypertension: Secondary | ICD-10-CM | POA: Diagnosis not present

## 2014-11-24 DIAGNOSIS — G40909 Epilepsy, unspecified, not intractable, without status epilepticus: Secondary | ICD-10-CM | POA: Diagnosis not present

## 2014-11-24 DIAGNOSIS — Z8673 Personal history of transient ischemic attack (TIA), and cerebral infarction without residual deficits: Secondary | ICD-10-CM | POA: Diagnosis not present

## 2014-11-24 DIAGNOSIS — Z792 Long term (current) use of antibiotics: Secondary | ICD-10-CM | POA: Insufficient documentation

## 2014-11-24 DIAGNOSIS — M25562 Pain in left knee: Secondary | ICD-10-CM | POA: Diagnosis present

## 2014-11-24 DIAGNOSIS — Z79899 Other long term (current) drug therapy: Secondary | ICD-10-CM | POA: Insufficient documentation

## 2014-11-24 DIAGNOSIS — M1712 Unilateral primary osteoarthritis, left knee: Secondary | ICD-10-CM | POA: Diagnosis not present

## 2014-11-24 DIAGNOSIS — M1710 Unilateral primary osteoarthritis, unspecified knee: Secondary | ICD-10-CM

## 2014-11-24 DIAGNOSIS — E785 Hyperlipidemia, unspecified: Secondary | ICD-10-CM | POA: Diagnosis not present

## 2014-11-24 DIAGNOSIS — J45909 Unspecified asthma, uncomplicated: Secondary | ICD-10-CM | POA: Diagnosis not present

## 2014-11-24 DIAGNOSIS — M179 Osteoarthritis of knee, unspecified: Secondary | ICD-10-CM | POA: Diagnosis not present

## 2014-11-24 DIAGNOSIS — F329 Major depressive disorder, single episode, unspecified: Secondary | ICD-10-CM | POA: Diagnosis not present

## 2014-11-24 MED ORDER — TRAMADOL HCL 50 MG PO TABS: 50.0000 mg | ORAL_TABLET | Freq: Four times a day (QID) | ORAL | Status: DC | PRN

## 2014-11-24 NOTE — Discharge Instructions (Signed)
Arthritis, Nonspecific °Arthritis is pain, redness, warmth, or puffiness (inflammation) of a joint. The joint may be stiff or hurt when you move it. One or more joints may be affected. There are many types of arthritis. Your doctor may not know what type you have right away. The most common cause of arthritis is wear and tear on the joint (osteoarthritis). °HOME CARE  °· Only take medicine as told by your doctor. °· Rest the joint as much as possible. °· Raise (elevate) your joint if it is puffy. °· Use crutches if the painful joint is in your leg. °· Drink enough fluids to keep your pee (urine) clear or pale yellow. °· Follow your doctor's diet instructions. °· Use cold packs for very bad joint pain for 10 to 15 minutes every hour. Ask your doctor if it is okay for you to use hot packs. °· Exercise as told by your doctor. °· Take a warm shower if you have stiffness in the morning. °· Move your sore joints throughout the day. °GET HELP RIGHT AWAY IF:  °· You have a fever. °· You have very bad joint pain, puffiness, or redness. °· You have many joints that are painful and puffy. °· You are not getting better with treatment. °· You have very bad back pain or leg weakness. °· You cannot control when you poop (bowel movement) or pee (urinate). °· You do not feel better in 24 hours or are getting worse. °· You are having side effects from your medicine. °MAKE SURE YOU:  °· Understand these instructions. °· Will watch your condition. °· Will get help right away if you are not doing well or get worse. °Document Released: 06/12/2009 Document Revised: 09/17/2011 Document Reviewed: 06/12/2009 °[employer]® Patient Information ©2015 [employer]. This information is not intended to replace advice given to you by your health care provider. Make sure you discuss any questions you have with your health care provider. ° °

## 2014-11-24 NOTE — ED Notes (Signed)
Pt c/o left knee pain.

## 2014-11-24 NOTE — ED Provider Notes (Signed)
CSN: [id number]     Arrival date & time 11/24/14  1238 History   First MD Initiated Contact with Patient 11/24/14 1250     Chief Complaint  Patient presents with  . Knee Pain     (Consider location/radiation/quality/duration/timing/severity/associated sxs/prior Treatment) Patient is a 49 y.o. female presenting with knee pain. The history is provided by the patient. No language interpreter was used.  Knee Pain Location:  Knee Injury: no   Knee location:  L knee Pain details:    Quality:  Aching   Radiates to:  Does not radiate   Severity:  Moderate Chronicity:  New Dislocation: no   Foreign body present:  No foreign bodies Relieved by:  Nothing Worsened by:  Nothing tried   Past Medical History  Diagnosis Date  . Stroke 1990    chronic right hemiparesis  . Asthma   . Hyperlipidemia   . HTN (hypertension)   . Depression   . Seizures     remote  . DDD (degenerative disc disease), lumbar    Past Surgical History  Procedure Laterality Date  . Partial hysterectomy    . Cesarean section    . Colonoscopy  12/24/2011    Procedure: COLONOSCOPY;  Surgeon: West Bali, MD;  Location: [facility];  Service: Endoscopy;  Laterality: N/A;  2:00  . Cholecystectomy N/A 07/13/2014    Procedure: LAPAROSCOPIC CONVERTED TO OPEN  CHOLECYSTECTOMY ;  Surgeon: Darnell Level, MD;  Location: [facility];  Service: General;  Laterality: N/A;  . Gallbladder surgery     Family History  Problem Relation Age of Onset  . Colon cancer Neg Hx   . Liver disease Neg Hx   . Stroke Father    Social History  Substance Use Topics  . Smoking status: Never Smoker   . Smokeless tobacco: None  . Alcohol Use: 1.2 oz/week    2 Standard drinks or equivalent per week     Comment: couple of drinks on weekends   OB History    No data available     Review of Systems  All other systems reviewed and are negative.     Allergies  Review of patient's allergies indicates no known allergies.  Home  Medications   Prior to Admission medications   Medication Sig Start Date End Date Taking? Authorizing Provider  azithromycin (ZITHROMAX) 250 MG tablet Take 1 tablet (250 mg total) by mouth daily. 10/19/14   Standley Brooking, MD  budesonide-formoterol [facility]) 80-4.5 MCG/ACT inhaler Inhale 2 puffs into the lungs every 12 (twelve) hours as needed. Shortness of breath    Historical Provider, MD  divalproex (DEPAKOTE) 250 MG DR tablet Take 250 mg by mouth 2 (two) times daily.     Historical Provider, MD  levalbuterol [facility] HFA) 45 MCG/ACT inhaler Inhale 1-2 puffs into the lungs every 4 (four) hours as needed. Shortness of breath    Historical Provider, MD  ondansetron (ZOFRAN) 4 MG tablet Take 1 tablet (4 mg total) by mouth every 6 (six) hours as needed for nausea. Patient not taking: Reported on 10/18/2014 07/17/14   Avel Peace, MD  pravastatin (PRAVACHOL) 20 MG tablet Take 20 mg by mouth at bedtime.     Historical Provider, MD  predniSONE (DELTASONE) 10 MG tablet Take 40 mg by mouth daily for3 days, then take 20 mg by mouth daily for 3 days, then take 10 mg by mouth daily for 3 days, then stop. 10/19/14   Standley Brooking, MD  promethazine [facility])  25 MG tablet Take 25 mg by mouth every 6 (six) hours as needed for nausea or vomiting.    Historical Provider, MD  tetrahydrozoline (EYE DROPS) 0.05 % ophthalmic solution Place 1 drop into both eyes daily as needed. Dry Eyes    Historical Provider, MD  verapamil (CALAN) 120 MG tablet Take 1 tablet by mouth daily. 09/23/13   Historical Provider, MD   BP 137/80 mmHg  Pulse 72  Temp(Src) 98.3 F (36.8 C) (Oral)  Resp 16  Ht  (1.575 m)  Wt 215 lb (97.523 kg)  BMI 39.31 kg/m2  SpO2 99% Physical Exam  Constitutional: She is oriented to person, place, and time. She appears well-developed and well-nourished.  Cardiovascular: Normal rate and regular rhythm.   Pulmonary/Chest: Effort normal and breath sounds normal.  Musculoskeletal:  Normal range of motion.  Mild swelling noted to the left knee. Full rom. No deformity or redness noted. Pt has full rom  Neurological: She is alert and oriented to person, place, and time.  Skin: Skin is warm and dry.  Nursing note and vitals reviewed.   ED Course  Procedures (including critical care time) Labs Review Labs Reviewed - No data to display  Imaging Review Dg Knee Complete 4 Views Left  11/24/2014   CLINICAL DATA:  Patient with patellar pain for 3 weeks. No known injury.  EXAM: LEFT KNEE - COMPLETE 4+ VIEW  COMPARISON:  None.  FINDINGS: Normal anatomic alignment. No evidence for acute fracture or dislocation. Medial and lateral compartment osteophytosis. No joint effusion.  IMPRESSION: Degenerative changes.  No acute osseus abnormality.   Electronically Signed   By: Annia Belt M.D.   On: 11/24/2014 13:30   I have personally reviewed and evaluated these images and lab results as part of my medical decision-making.   EKG Interpretation None      MDM   Final diagnoses:  Arthritis of knee    No acute bony injury noted. Pt given ultram for pain. Discussed follow up with pcp for continued symptoms    Teressa Lower, NP 11/24/14 1340  Benjiman Core, MD 11/24/14 [phone]

## 2015-03-29 DIAGNOSIS — J449 Chronic obstructive pulmonary disease, unspecified: Secondary | ICD-10-CM | POA: Diagnosis not present

## 2015-03-29 DIAGNOSIS — E785 Hyperlipidemia, unspecified: Secondary | ICD-10-CM | POA: Diagnosis not present

## 2015-03-29 DIAGNOSIS — I10 Essential (primary) hypertension: Secondary | ICD-10-CM | POA: Diagnosis not present

## 2015-03-29 DIAGNOSIS — Z6841 Body Mass Index (BMI) 40.0 and over, adult: Secondary | ICD-10-CM | POA: Diagnosis not present

## 2015-08-30 ENCOUNTER — Other Ambulatory Visit (HOSPITAL_COMMUNITY): Payer: Self-pay | Admitting: Family Medicine

## 2015-08-30 DIAGNOSIS — Z1231 Encounter for screening mammogram for malignant neoplasm of breast: Secondary | ICD-10-CM

## 2015-09-01 ENCOUNTER — Ambulatory Visit (HOSPITAL_COMMUNITY)
Admission: RE | Admit: 2015-09-01 | Discharge: 2015-09-01 | Disposition: A | Discharge status: Home / Self Care | Payer: Medicare Other | Source: Ambulatory Visit | Attending: Family Medicine | Admitting: Family Medicine

## 2015-09-01 DIAGNOSIS — Z1231 Encounter for screening mammogram for malignant neoplasm of breast: Secondary | ICD-10-CM | POA: Insufficient documentation

## 2015-09-10 ENCOUNTER — Emergency Department (HOSPITAL_COMMUNITY)
Admission: EM | Admit: 2015-09-10 | Discharge: 2015-09-10 | Disposition: A | Discharge status: Home / Self Care | Payer: Medicare Other | Attending: Emergency Medicine | Admitting: Emergency Medicine

## 2015-09-10 ENCOUNTER — Encounter (HOSPITAL_COMMUNITY): Payer: Self-pay | Admitting: *Deleted

## 2015-09-10 DIAGNOSIS — F329 Major depressive disorder, single episode, unspecified: Secondary | ICD-10-CM | POA: Diagnosis not present

## 2015-09-10 DIAGNOSIS — W57XXXA Bitten or stung by nonvenomous insect and other nonvenomous arthropods, initial encounter: Secondary | ICD-10-CM | POA: Insufficient documentation

## 2015-09-10 DIAGNOSIS — J45909 Unspecified asthma, uncomplicated: Secondary | ICD-10-CM | POA: Insufficient documentation

## 2015-09-10 DIAGNOSIS — Z8669 Personal history of other diseases of the nervous system and sense organs: Secondary | ICD-10-CM | POA: Insufficient documentation

## 2015-09-10 DIAGNOSIS — E785 Hyperlipidemia, unspecified: Secondary | ICD-10-CM | POA: Insufficient documentation

## 2015-09-10 DIAGNOSIS — Z8673 Personal history of transient ischemic attack (TIA), and cerebral infarction without residual deficits: Secondary | ICD-10-CM | POA: Diagnosis not present

## 2015-09-10 DIAGNOSIS — S40862A Insect bite (nonvenomous) of left upper arm, initial encounter: Secondary | ICD-10-CM | POA: Diagnosis not present

## 2015-09-10 DIAGNOSIS — M5136 Other intervertebral disc degeneration, lumbar region: Secondary | ICD-10-CM | POA: Insufficient documentation

## 2015-09-10 DIAGNOSIS — Y999 Unspecified external cause status: Secondary | ICD-10-CM | POA: Insufficient documentation

## 2015-09-10 DIAGNOSIS — Z7952 Long term (current) use of systemic steroids: Secondary | ICD-10-CM | POA: Diagnosis not present

## 2015-09-10 DIAGNOSIS — I10 Essential (primary) hypertension: Secondary | ICD-10-CM | POA: Insufficient documentation

## 2015-09-10 DIAGNOSIS — S20461D Insect bite (nonvenomous) of right back wall of thorax, subsequent encounter: Secondary | ICD-10-CM | POA: Diagnosis not present

## 2015-09-10 DIAGNOSIS — Z79899 Other long term (current) drug therapy: Secondary | ICD-10-CM | POA: Diagnosis not present

## 2015-09-10 DIAGNOSIS — Z792 Long term (current) use of antibiotics: Secondary | ICD-10-CM | POA: Diagnosis not present

## 2015-09-10 DIAGNOSIS — Y939 Activity, unspecified: Secondary | ICD-10-CM | POA: Diagnosis not present

## 2015-09-10 DIAGNOSIS — Y929 Unspecified place or not applicable: Secondary | ICD-10-CM | POA: Insufficient documentation

## 2015-09-10 NOTE — ED Notes (Signed)
Pt complains of insect bite to left upper back. Upon exam tick present.

## 2015-09-10 NOTE — Discharge Instructions (Signed)
Tick Bite Information °Ticks are insects that attach themselves to the skin. There are many types of ticks. Common types include wood ticks and deer ticks. Sometimes, ticks carry diseases that can make a person very ill. The most common places for ticks to attach themselves are the scalp, neck, armpits, waist, and groin.  °HOW CAN YOU PREVENT TICK BITES? °Take these steps to help prevent tick bites when you are outdoors: °· Wear long sleeves and long pants. °· Wear white clothes so you can see ticks more easily. °· Tuck your pant legs into your socks. °· If walking on a trail, stay in the middle of the trail to avoid brushing against bushes. °· Avoid walking through areas with long grass. °· Put bug spray on all skin that is showing and along boot tops, pant legs, and sleeve cuffs. °· Check clothes, hair, and skin often and before going inside. °· Brush off any ticks that are not attached. °· Take a shower or bath as soon as possible after being outdoors. °HOW SHOULD YOU REMOVE A TICK? °Ticks should be removed as soon as possible to help prevent diseases. °1. If latex gloves are available, put them on before trying to remove a tick. °2. Use tweezers to grasp the tick as close to the skin as possible. You may also use curved forceps or a tick removal tool. Grasp the tick as close to its head as possible. Avoid grasping the tick on its body. °3. Pull gently upward until the tick lets go. Do not twist the tick or jerk it suddenly. This may break off the tick's head or mouth parts. °4. Do not squeeze or crush the tick's body. This could force disease-carrying fluids from the tick into your body. °5. After the tick is removed, wash the bite area and your hands with soap and water or alcohol. °6. Apply a small amount of antiseptic cream or ointment to the bite site. °7. Wash any tools that were used. °Do not try to remove a tick by applying a hot match, petroleum jelly, or fingernail polish to the tick. These methods do  not work. They may also increase the chances of disease being spread from the tick bite. °WHEN SHOULD YOU SEEK HELP? °Contact your health care provider if you are unable to remove a tick or if a part of the tick breaks off in the skin. °After a tick bite, you need to watch for signs and symptoms of diseases that can be spread by ticks. Contact your health care provider if you develop any of the following: °· Fever. °· Rash. °· Redness and puffiness (swelling) in the area of the tick bite. °· Tender, puffy lymph glands. °· Watery poop (diarrhea). °· Weight loss. °· Cough. °· Feeling more tired than normal (fatigue). °· Muscle, joint, or bone pain. °· Belly (abdominal) pain. °· Headache. °· Change in your level of consciousness. °· Trouble walking or moving your legs. °· Loss of feeling (numbness) in the legs. °· Loss of movement (paralysis). °· Shortness of breath. °· Confusion. °· Throwing up (vomiting) many times. °  °This information is not intended to replace advice given to you by your health care provider. Make sure you discuss any questions you have with your health care provider. °  °Document Released: 06/12/2009 Document Revised: 11/18/2012 Document Reviewed: 08/26/2012 °Elsevier Interactive Patient Education ©2016 [employer] ° °

## 2015-09-10 NOTE — ED Provider Notes (Signed)
CSN: [id number]     Arrival date & time 09/10/15  1254 History  By signing my name below, I, Ronney Lion, attest that this documentation has been prepared under the direction and in the presence of Margarita Grizzle, MD. Electronically Signed: Ronney Lion, ED Scribe. 09/10/2015. 1:25 PM.    Chief Complaint  Patient presents with  . Tick Removal   The history is provided by the patient. No language interpreter was used.    HPI Comments: Patty [name] is a 50 y.o. female with a history of CVA, asthma, hyperlipidemia, HTN, and seizures, who presents to the Emergency Department complaining of constant, moderate itching on her left upper back, which she suspects is due to a tick bite, that began 3-4 days ago. No associated factors were noted. Patient states she works outside; she did not see any other ticks on her body. No treatments or modifying factors were noted. She reports a history of asthma, seizures, and hypertension.  She denies fever, SOB, or any other symptoms. She reports occasional smoking and EtOH consumption. Patient reports NKDA.    Past Medical History  Diagnosis Date  . Stroke [facility]) 1990    chronic right hemiparesis  . Asthma   . Hyperlipidemia   . HTN (hypertension)   . Depression   . Seizures (HCC)     remote  . DDD (degenerative disc disease), lumbar    Past Surgical History  Procedure Laterality Date  . Partial hysterectomy    . Cesarean section    . Colonoscopy  12/24/2011    Procedure: COLONOSCOPY;  Surgeon: West Bali, MD;  Location[facility];  Service: Endoscopy;  Laterality: N/A;  2:00  . Cholecystectomy N/A 07/13/2014    Procedure: LAPAROSCOPIC CONVERTED TO OPEN  CHOLECYSTECTOMY ;  Surgeon: Darnell Level, MD;  Location: [facility];  Service: General;  Laterality: N/A;  . Gallbladder surgery     Family History  Problem Relation Age of Onset  . Colon cancer Neg Hx   . Liver disease Neg Hx   . Stroke Father    Social History  Substance Use Topics  . Smoking  status: Never Smoker   . Smokeless tobacco: None  . Alcohol Use: 1.2 oz/week    2 Standard drinks or equivalent per week     Comment: couple of drinks on weekends   OB History    No data available     Review of Systems  Constitutional: Negative for fever.  Skin: Positive for color change (localized redness and itching on left upper back).  All other systems reviewed and are negative.     Allergies  Review of patient's allergies indicates no known allergies.  Home Medications   Prior to Admission medications   Medication Sig Start Date End Date Taking? Authorizing Provider  azithromycin (ZITHROMAX) 250 MG tablet Take 1 tablet (250 mg total) by mouth daily. 10/19/14   Standley Brooking, MD  budesonide-formoterol [facility]) 80-4.5 MCG/ACT inhaler Inhale 2 puffs into the lungs every 12 (twelve) hours as needed. Shortness of breath    Historical Provider, MD  divalproex (DEPAKOTE) 250 MG DR tablet Take 250 mg by mouth 2 (two) times daily.     Historical Provider, MD  levalbuterol [facility] HFA) 45 MCG/ACT inhaler Inhale 1-2 puffs into the lungs every 4 (four) hours as needed. Shortness of breath    Historical Provider, MD  ondansetron (ZOFRAN) 4 MG tablet Take 1 tablet (4 mg total) by mouth every 6 (six) hours as needed  for nausea. Patient not taking: Reported on 10/18/2014 07/17/14   Avel Peaceodd Rosenbower, MD  pravastatin (PRAVACHOL) 20 MG tablet Take 20 mg by mouth at bedtime.     Historical Provider, MD  predniSONE (DELTASONE) 10 MG tablet Take 40 mg by mouth daily for3 days, then take 20 mg by mouth daily for 3 days, then take 10 mg by mouth daily for 3 days, then stop. 10/19/14   Standley Brookinganiel P Goodrich, MD  promethazine (PHENERGAN) 25 MG tablet Take 25 mg by mouth every 6 (six) hours as needed for nausea or vomiting.    Historical Provider, MD  tetrahydrozoline (EYE DROPS) 0.05 % ophthalmic solution Place 1 drop into both eyes daily as needed. Dry Eyes    Historical Provider, MD  traMADol  (ULTRAM) 50 MG tablet Take 1 tablet (50 mg total) by mouth every 6 (six) hours as needed. 11/24/14   Teressa LowerVrinda Pickering, NP  verapamil (CALAN) 120 MG tablet Take 1 tablet by mouth daily. 09/23/13   Historical Provider, MD   BP 131/77 mmHg  Pulse 82  Temp(Src) 98.2 F (36.8 C) (Oral)  Resp 18  Ht 5\' 2"  (1.575 m)  Wt 220 lb (99.791 kg)  BMI 40.23 kg/m2  SpO2 98% Physical Exam  Constitutional: She is oriented to person, place, and time. She appears well-developed and well-nourished. No distress.  HENT:  Head: Normocephalic and atraumatic.  Right Ear: External ear normal.  Left Ear: External ear normal.  Nose: Nose normal.  Eyes: Conjunctivae and EOM are normal. Pupils are equal, round, and reactive to light.  Neck: Normal range of motion. Neck supple.  Pulmonary/Chest: Effort normal.  Musculoskeletal: Normal range of motion.  Neurological: She is alert and oriented to person, place, and time. She exhibits normal muscle tone. Coordination normal.  Skin: Skin is warm and dry.  Psychiatric: She has a normal mood and affect. Her behavior is normal. Thought content normal.  Nursing note and vitals reviewed.   ED Course  Procedures (including critical care time)  DIAGNOSTIC STUDIES: Oxygen Saturation is 98% on RA, normal by my interpretation.    COORDINATION OF CARE: 1:19 PM - Entire removed by myself (Dr. Rosalia Hammersay) without difficulty. Strict return precautions given. Pt verbalized understanding and agreed to plan.   MDM   Final diagnoses:  Tick bite with subsequent removal of tick    I personally performed the services described in this documentation, which was scribed in my presence. The recorded information has been reviewed and considered.   Margarita Grizzleanielle Miranda Frese, MD 09/10/15 2122

## 2015-09-19 DIAGNOSIS — G40909 Epilepsy, unspecified, not intractable, without status epilepticus: Secondary | ICD-10-CM | POA: Diagnosis not present

## 2015-09-19 DIAGNOSIS — J449 Chronic obstructive pulmonary disease, unspecified: Secondary | ICD-10-CM | POA: Diagnosis not present

## 2015-09-19 DIAGNOSIS — S30860A Insect bite (nonvenomous) of lower back and pelvis, initial encounter: Secondary | ICD-10-CM | POA: Diagnosis not present

## 2015-09-19 DIAGNOSIS — I10 Essential (primary) hypertension: Secondary | ICD-10-CM | POA: Diagnosis not present

## 2015-09-19 DIAGNOSIS — E785 Hyperlipidemia, unspecified: Secondary | ICD-10-CM | POA: Diagnosis not present

## 2016-02-12 DIAGNOSIS — E785 Hyperlipidemia, unspecified: Secondary | ICD-10-CM | POA: Diagnosis not present

## 2016-02-12 DIAGNOSIS — J449 Chronic obstructive pulmonary disease, unspecified: Secondary | ICD-10-CM | POA: Diagnosis not present

## 2016-02-12 DIAGNOSIS — I10 Essential (primary) hypertension: Secondary | ICD-10-CM | POA: Diagnosis not present

## 2016-02-29 ENCOUNTER — Other Ambulatory Visit (HOSPITAL_COMMUNITY)
Admission: RE | Admit: 2016-02-29 | Discharge: 2016-02-29 | Disposition: A | Discharge status: Home / Self Care | Payer: Medicare Other | Source: Ambulatory Visit | Attending: Family Medicine | Admitting: Family Medicine

## 2016-02-29 DIAGNOSIS — E785 Hyperlipidemia, unspecified: Secondary | ICD-10-CM | POA: Diagnosis not present

## 2016-02-29 DIAGNOSIS — I10 Essential (primary) hypertension: Secondary | ICD-10-CM | POA: Insufficient documentation

## 2016-02-29 LAB — BASIC METABOLIC PANEL
ANION GAP: 8 (ref 5–15)
BUN: 19 mg/dL (ref 6–20)
CHLORIDE: 106 mmol/L (ref 101–111)
CO2: 26 mmol/L (ref 22–32)
CREATININE: 0.7 mg/dL (ref 0.44–1.00)
Calcium: 8.6 mg/dL — ABNORMAL LOW (ref 8.9–10.3)
GFR calc Af Amer: >60 mL/min (ref >60)
GFR calc non Af Amer: >60 mL/min (ref >60)
Glucose, Bld: 101 mg/dL — ABNORMAL HIGH (ref 65–99)
Potassium: 3 mmol/L — ABNORMAL LOW (ref 3.5–5.1)
SODIUM: 140 mmol/L (ref 135–145)

## 2016-02-29 LAB — LIPID PANEL
CHOLESTEROL: 122 mg/dL (ref 0–200)
HDL: 41 mg/dL (ref >40)
LDL Cholesterol: 66 mg/dL (ref 0–99)
Total CHOL/HDL Ratio: 3 RATIO
Triglycerides: 76 mg/dL (ref <150)
VLDL: 15 mg/dL (ref 0–40)

## 2016-03-07 ENCOUNTER — Encounter (HOSPITAL_COMMUNITY): Payer: Self-pay | Admitting: Emergency Medicine

## 2016-03-07 ENCOUNTER — Emergency Department (HOSPITAL_COMMUNITY): Payer: Medicare Other

## 2016-03-07 ENCOUNTER — Emergency Department (HOSPITAL_COMMUNITY)
Admission: EM | Admit: 2016-03-07 | Discharge: 2016-03-07 | Disposition: A | Discharge status: Home / Self Care | Payer: Medicare Other | Attending: Emergency Medicine | Admitting: Emergency Medicine

## 2016-03-07 DIAGNOSIS — I10 Essential (primary) hypertension: Secondary | ICD-10-CM | POA: Diagnosis not present

## 2016-03-07 DIAGNOSIS — R609 Edema, unspecified: Secondary | ICD-10-CM | POA: Insufficient documentation

## 2016-03-07 DIAGNOSIS — Z79899 Other long term (current) drug therapy: Secondary | ICD-10-CM | POA: Insufficient documentation

## 2016-03-07 DIAGNOSIS — M7989 Other specified soft tissue disorders: Secondary | ICD-10-CM | POA: Diagnosis not present

## 2016-03-07 DIAGNOSIS — J45909 Unspecified asthma, uncomplicated: Secondary | ICD-10-CM | POA: Diagnosis not present

## 2016-03-07 DIAGNOSIS — M79604 Pain in right leg: Secondary | ICD-10-CM | POA: Diagnosis present

## 2016-03-07 DIAGNOSIS — M79661 Pain in right lower leg: Secondary | ICD-10-CM | POA: Diagnosis not present

## 2016-03-07 DIAGNOSIS — R600 Localized edema: Secondary | ICD-10-CM | POA: Diagnosis not present

## 2016-03-07 LAB — CBC WITH DIFFERENTIAL/PLATELET
Basophils Absolute: 0 10*3/uL (ref 0.0–0.1)
Basophils Relative: 0 %
EOS PCT: 7 %
Eosinophils Absolute: 0.4 10*3/uL (ref 0.0–0.7)
HCT: 42.7 % (ref 36.0–46.0)
HEMOGLOBIN: 13.8 g/dL (ref 12.0–15.0)
LYMPHS ABS: 3.2 10*3/uL (ref 0.7–4.0)
Lymphocytes Relative: 50 %
MCH: 30.7 pg (ref 26.0–34.0)
MCHC: 32.3 g/dL (ref 30.0–36.0)
MCV: 94.9 fL (ref 78.0–100.0)
Monocytes Absolute: 0.3 10*3/uL (ref 0.1–1.0)
Monocytes Relative: 5 %
NEUTROS PCT: 38 %
Neutro Abs: 2.4 10*3/uL (ref 1.7–7.7)
PLATELETS: 277 10*3/uL (ref 150–400)
RBC: 4.5 MIL/uL (ref 3.87–5.11)
RDW: 13.2 % (ref 11.5–15.5)
WBC: 6.4 10*3/uL (ref 4.0–10.5)

## 2016-03-07 LAB — BASIC METABOLIC PANEL
ANION GAP: 7 (ref 5–15)
BUN: 13 mg/dL (ref 6–20)
CO2: 26 mmol/L (ref 22–32)
Calcium: 8.8 mg/dL — ABNORMAL LOW (ref 8.9–10.3)
Chloride: 105 mmol/L (ref 101–111)
Creatinine, Ser: 0.65 mg/dL (ref 0.44–1.00)
GFR calc Af Amer: >60 mL/min (ref >60)
Glucose, Bld: 94 mg/dL (ref 65–99)
Potassium: 3.3 mmol/L — ABNORMAL LOW (ref 3.5–5.1)
SODIUM: 138 mmol/L (ref 135–145)

## 2016-03-07 LAB — PROTIME-INR
INR: 0.94
PROTHROMBIN TIME: 12.6 s (ref 11.4–15.2)

## 2016-03-07 MED ORDER — NAPROXEN 500 MG PO TABS: 500.0000 mg | ORAL_TABLET | Freq: Two times a day (BID) | ORAL | 0 refills | Status: DC

## 2016-03-07 NOTE — ED Triage Notes (Signed)
Pt reports right calf pain and swelling with no injury for a week.

## 2016-03-07 NOTE — Discharge Instructions (Signed)
Elevate your leg as much as possible and apply warm compresses (or a heating pad) 20 minutes several times daily.  You may also benefit from a compression stocking to help with swelling if your symptoms persist.  Your lab tests and your ultrasound are normal today with no blood clot seen.

## 2016-03-07 NOTE — ED Notes (Signed)
Per Idol P.A. She requested we ace wrap patients leg and ankle.  Ace wrap applied and patient understands.

## 2016-03-07 NOTE — ED Provider Notes (Signed)
[facility] Provider Note   CSN: [id number] Arrival date & time: 03/07/16  0810     History   Chief Complaint Chief Complaint  Patient presents with  . Leg Pain    HPI Patty [name] is a 50 y.o. female with past medical history significant for HTN and cva with residual right sided weakness presenting with a 1 week history of right calf pain and swelling.  she was walking in a parking lot when she felt a "charley horse" type cramp in her right posterior calf which has been persistent all week, present with walking and flexing her foot and better at rest along with swelling to her knee.  She denies prior history of similar symptoms and denies trauma.  She has tried warm soaks, ibuprofen, aleve and muscle rub without relief.  She denies chest pain and sob.  The history is provided by the patient.    Past Medical History:  Diagnosis Date  . Asthma   . DDD (degenerative disc disease), lumbar   . Depression   . HTN (hypertension)   . Hyperlipidemia   . Seizures (HCC)    remote  . Stroke [facility](HCC) 1990   chronic right hemiparesis    Patient Active Problem List   Diagnosis Date Noted  . Asthma exacerbation 10/18/2014  . Hyperglycemia 10/18/2014  . Acute respiratory failure with hypoxia (HCC) 10/18/2014  . Essential hypertension 10/18/2014  . Seizure disorder (HCC) 10/18/2014  . Acute cholecystitis 07/14/2014  . Cholelithiasis with acute cholecystitis 07/13/2014  . Cholecystitis with cholelithiasis 07/12/2014  . Rectal bleed 11/13/2011  . Left flank pain 11/13/2011    Past Surgical History:  Procedure Laterality Date  . CESAREAN SECTION    . CHOLECYSTECTOMY N/A 07/13/2014   Procedure: LAPAROSCOPIC CONVERTED TO OPEN  CHOLECYSTECTOMY ;  Surgeon: Darnell Levelodd Gerkin, MD;  Location: [facility];  Service: General;  Laterality: N/A;  . COLONOSCOPY  12/24/2011   Procedure: COLONOSCOPY;  Surgeon: West BaliSandi L Fields, MD;  Location: [facility];  Service: Endoscopy;  Laterality: N/A;  2:00  .  GALLBLADDER SURGERY    . PARTIAL HYSTERECTOMY      OB History    No data available       Home Medications    Prior to Admission medications   Medication Sig Start Date End Date Taking? Authorizing Provider  budesonide-formoterol (SYMBICORT) 80-4.5 MCG/ACT inhaler Inhale 2 puffs into the lungs every 12 (twelve) hours as needed. Shortness of breath   Yes Historical Provider, MD  divalproex (DEPAKOTE) 250 MG DR tablet Take 250 mg by mouth 2 (two) times daily.    Yes Historical Provider, MD  levalbuterol [facility](XOPENEX HFA) 45 MCG/ACT inhaler Inhale 1-2 puffs into the lungs every 4 (four) hours as needed. Shortness of breath   Yes Historical Provider, MD  pravastatin (PRAVACHOL) 20 MG tablet Take 20 mg by mouth at bedtime.    Yes Historical Provider, MD  tetrahydrozoline (EYE DROPS) 0.05 % ophthalmic solution Place 1 drop into both eyes daily as needed. Dry Eyes   Yes Historical Provider, MD  verapamil (CALAN) 120 MG tablet Take 1 tablet by mouth daily. 09/23/13  Yes Historical Provider, MD  naproxen (NAPROSYN) 500 MG tablet Take 1 tablet (500 mg total) by mouth 2 (two) times daily. 03/07/16   Burgess AmorJulie Deron Poole, PA-C  predniSONE (DELTASONE) 10 MG tablet Take 40 mg by mouth daily for3 days, then take 20 mg by mouth daily for 3 days, then take 10 mg by mouth daily for 3 days,  then stop. 10/19/14   Standley Brookinganiel P Goodrich, MD    Family History Family History  Problem Relation Age of Onset  . Stroke Father   . Colon cancer Neg Hx   . Liver disease Neg Hx     Social History Social History  Substance Use Topics  . Smoking status: Never Smoker  . Smokeless tobacco: Not on file  . Alcohol use 1.2 oz/week    2 Standard drinks or equivalent per week     Comment: couple of drinks on weekends     Allergies   Patient has no known allergies.   Review of Systems Review of Systems  Constitutional: Negative for fever.  HENT: Negative for congestion and sore throat.   Eyes: Negative.   Respiratory: Negative  for chest tightness and shortness of breath.   Cardiovascular: Negative for chest pain.  Gastrointestinal: Negative for abdominal pain, nausea and vomiting.  Genitourinary: Negative.   Musculoskeletal: Positive for myalgias. Negative for arthralgias, joint swelling and neck pain.  Skin: Negative.  Negative for color change, rash and wound.  Neurological: Negative for dizziness, weakness, light-headedness, numbness and headaches.  Psychiatric/Behavioral: Negative.      Physical Exam Updated Vital Signs BP 123/82 (BP Location: Right Arm)   Pulse 65   Temp 97.8 F (36.6 C) (Oral)   Resp 18   Ht 5\' 2"  (1.575 m)   Wt 96.2 kg   SpO2 100%   BMI 38.78 kg/m   Physical Exam  Constitutional: She appears well-developed and well-nourished.  HENT:  Head: Normocephalic and atraumatic.  Eyes: Conjunctivae are normal.  Neck: Normal range of motion.  Cardiovascular: Normal rate, regular rhythm, normal heart sounds and intact distal pulses.   Pulmonary/Chest: Effort normal and breath sounds normal. She has no wheezes.  Abdominal: Soft. Bowel sounds are normal. There is no tenderness.  Musculoskeletal: Normal range of motion. She exhibits edema.  Right calf tense without palpable deformity or cords.  Pitting edema to upper tibia.  No erythema, skin intact.  Pedal pulses easily palpated and normal. Positive Homan's.   Neurological: She is alert.  Skin: Skin is warm and dry. Capillary refill takes less than 2 seconds. No lesion and no rash noted. No erythema.  Psychiatric: She has a normal mood and affect.  Nursing note and vitals reviewed.    ED Treatments / Results  Labs (all labs ordered are listed, but only abnormal results are displayed) Labs Reviewed  BASIC METABOLIC PANEL - Abnormal; Notable for the following:       Result Value   Potassium 3.3 (*)    Calcium 8.8 (*)    All other components within normal limits  CBC WITH DIFFERENTIAL/PLATELET  PROTIME-INR    EKG  EKG  Interpretation None       Radiology [place] Venous Img Lower Unilateral Right  Result Date: 03/07/2016 CLINICAL DATA:  Right calf pain and swelling for the past week. Evaluate for DVT. EXAM: RIGHT LOWER EXTREMITY VENOUS DOPPLER ULTRASOUND TECHNIQUE: Gray-scale sonography with graded compression, as well as color Doppler and duplex ultrasound were performed to evaluate the lower extremity deep venous systems from the level of the common femoral vein and including the common femoral, femoral, profunda femoral, popliteal and calf veins including the posterior tibial, peroneal and gastrocnemius veins when visible. The superficial great saphenous vein was also interrogated. Spectral Doppler was utilized to evaluate flow at rest and with distal augmentation maneuvers in the common femoral, femoral and popliteal veins. COMPARISON:  None. FINDINGS: Contralateral  Common Femoral Vein: Respiratory phasicity is normal and symmetric with the symptomatic side. No evidence of thrombus. Normal compressibility. Common Femoral Vein: No evidence of thrombus. Normal compressibility, respiratory phasicity and response to augmentation. Saphenofemoral Junction: No evidence of thrombus. Normal compressibility and flow on color Doppler imaging. Profunda Femoral Vein: No evidence of thrombus. Normal compressibility and flow on color Doppler imaging. Femoral Vein: No evidence of thrombus. Normal compressibility, respiratory phasicity and response to augmentation. Popliteal Vein: No evidence of thrombus. Normal compressibility, respiratory phasicity and response to augmentation. Calf Veins: No evidence of thrombus. Normal compressibility and flow on color Doppler imaging. Superficial Great Saphenous Vein: No evidence of thrombus. Normal compressibility and flow on color Doppler imaging. Venous Reflux:  None. Other Findings:  None. IMPRESSION: No evidence of DVT within the right lower extremity. Electronically Signed   By: Simonne Come M.D.    On: 03/07/2016 09:41    Procedures Procedures (including critical care time)  Medications Ordered in ED Medications - No data to display   Initial Impression / Assessment and Plan / ED Course  I have reviewed the triage vital signs and the nursing notes.  Pertinent labs & imaging results that were available during my care of the patient were reviewed by me and considered in my medical decision making (see chart for details).  Clinical Course     Basic labs including pt/INR, venous US ordered.    Labs and [place] reviewed and negative for source of edema.  Pt advised elevation, warm compresses, compression stocking. F/u with pcp for a recheck if not improving.  Prescribed naproxen for inflammation.   Final Clinical Impressions(s) / ED Diagnoses   Final diagnoses:  Right leg pain  Edema, unspecified type    New Prescriptions New Prescriptions   NAPROXEN (NAPROSYN) 500 MG TABLET    Take 1 tablet (500 mg total) by mouth 2 (two) times daily.     Burgess Amor, PA-C 03/07/16 1044    Raeford Razor, MD 03/07/16 [phone]

## 2016-07-18 ENCOUNTER — Emergency Department (HOSPITAL_COMMUNITY)
Admission: EM | Admit: 2016-07-18 | Discharge: 2016-07-18 | Disposition: A | Discharge status: Home / Self Care | Payer: Medicare Other | Attending: Dermatology | Admitting: Dermatology

## 2016-07-18 ENCOUNTER — Encounter (HOSPITAL_COMMUNITY): Payer: Self-pay | Admitting: *Deleted

## 2016-07-18 DIAGNOSIS — I10 Essential (primary) hypertension: Secondary | ICD-10-CM | POA: Diagnosis not present

## 2016-07-18 DIAGNOSIS — Z5321 Procedure and treatment not carried out due to patient leaving prior to being seen by health care provider: Secondary | ICD-10-CM | POA: Insufficient documentation

## 2016-07-18 DIAGNOSIS — R42 Dizziness and giddiness: Secondary | ICD-10-CM | POA: Diagnosis not present

## 2016-07-18 DIAGNOSIS — Z79899 Other long term (current) drug therapy: Secondary | ICD-10-CM | POA: Insufficient documentation

## 2016-07-18 DIAGNOSIS — J45909 Unspecified asthma, uncomplicated: Secondary | ICD-10-CM | POA: Diagnosis not present

## 2016-07-18 LAB — CBG MONITORING, ED: Glucose-Capillary: 85 mg/dL (ref 65–99)

## 2016-07-18 NOTE — ED Notes (Signed)
Called to triage again with no answer  

## 2016-07-18 NOTE — ED Triage Notes (Signed)
Pt c/o dizziness and shaking that started around 0900 after drinking coffee at 0815. Pt reports she doesn't usually drink coffee and wonders if her symptoms are because of the coffee. Pt reports she doesn't feel like the room is spinning, nor does she feel as if she is going to pass out. Pt unable to describe any further details about her dizziness. Pt has hx of stroke with right sided weakness. Pt denies any new unilateral weakness. Grips equal bilaterally, no arm or leg drift, face symmetrical in triage. Pt's speech is slurred but pt reports this is her normal.   EDP notified of pt's symptoms, no need to call code stroke at this time.

## 2016-07-18 NOTE — ED Notes (Signed)
Called for pt with no response. 

## 2016-08-12 ENCOUNTER — Other Ambulatory Visit (HOSPITAL_COMMUNITY): Payer: Self-pay | Admitting: Family Medicine

## 2016-08-12 DIAGNOSIS — Z1231 Encounter for screening mammogram for malignant neoplasm of breast: Secondary | ICD-10-CM

## 2016-09-02 ENCOUNTER — Ambulatory Visit (HOSPITAL_COMMUNITY)
Admission: RE | Admit: 2016-09-02 | Discharge: 2016-09-02 | Disposition: A | Discharge status: Home / Self Care | Payer: Medicare Other | Source: Ambulatory Visit | Attending: Family Medicine | Admitting: Family Medicine

## 2016-09-02 DIAGNOSIS — Z1231 Encounter for screening mammogram for malignant neoplasm of breast: Secondary | ICD-10-CM | POA: Diagnosis not present

## 2016-09-16 DIAGNOSIS — J449 Chronic obstructive pulmonary disease, unspecified: Secondary | ICD-10-CM | POA: Diagnosis not present

## 2016-09-16 DIAGNOSIS — S8001XA Contusion of right knee, initial encounter: Secondary | ICD-10-CM | POA: Diagnosis not present

## 2016-09-16 DIAGNOSIS — I10 Essential (primary) hypertension: Secondary | ICD-10-CM | POA: Diagnosis not present

## 2016-09-16 DIAGNOSIS — E785 Hyperlipidemia, unspecified: Secondary | ICD-10-CM | POA: Diagnosis not present

## 2016-09-20 DIAGNOSIS — I10 Essential (primary) hypertension: Secondary | ICD-10-CM | POA: Diagnosis not present

## 2016-09-20 DIAGNOSIS — E785 Hyperlipidemia, unspecified: Secondary | ICD-10-CM | POA: Diagnosis not present

## 2017-02-01 IMAGING — CR DG CHEST 1V PORT
1 series · 1 of 1 positions shown · non-contrast
Comparison: Chest radiograph dated 07/12/2014

CLINICAL DATA: 49-year-old female with shortness of breath

EXAM:
PORTABLE CHEST - 1 VIEW

[ap]
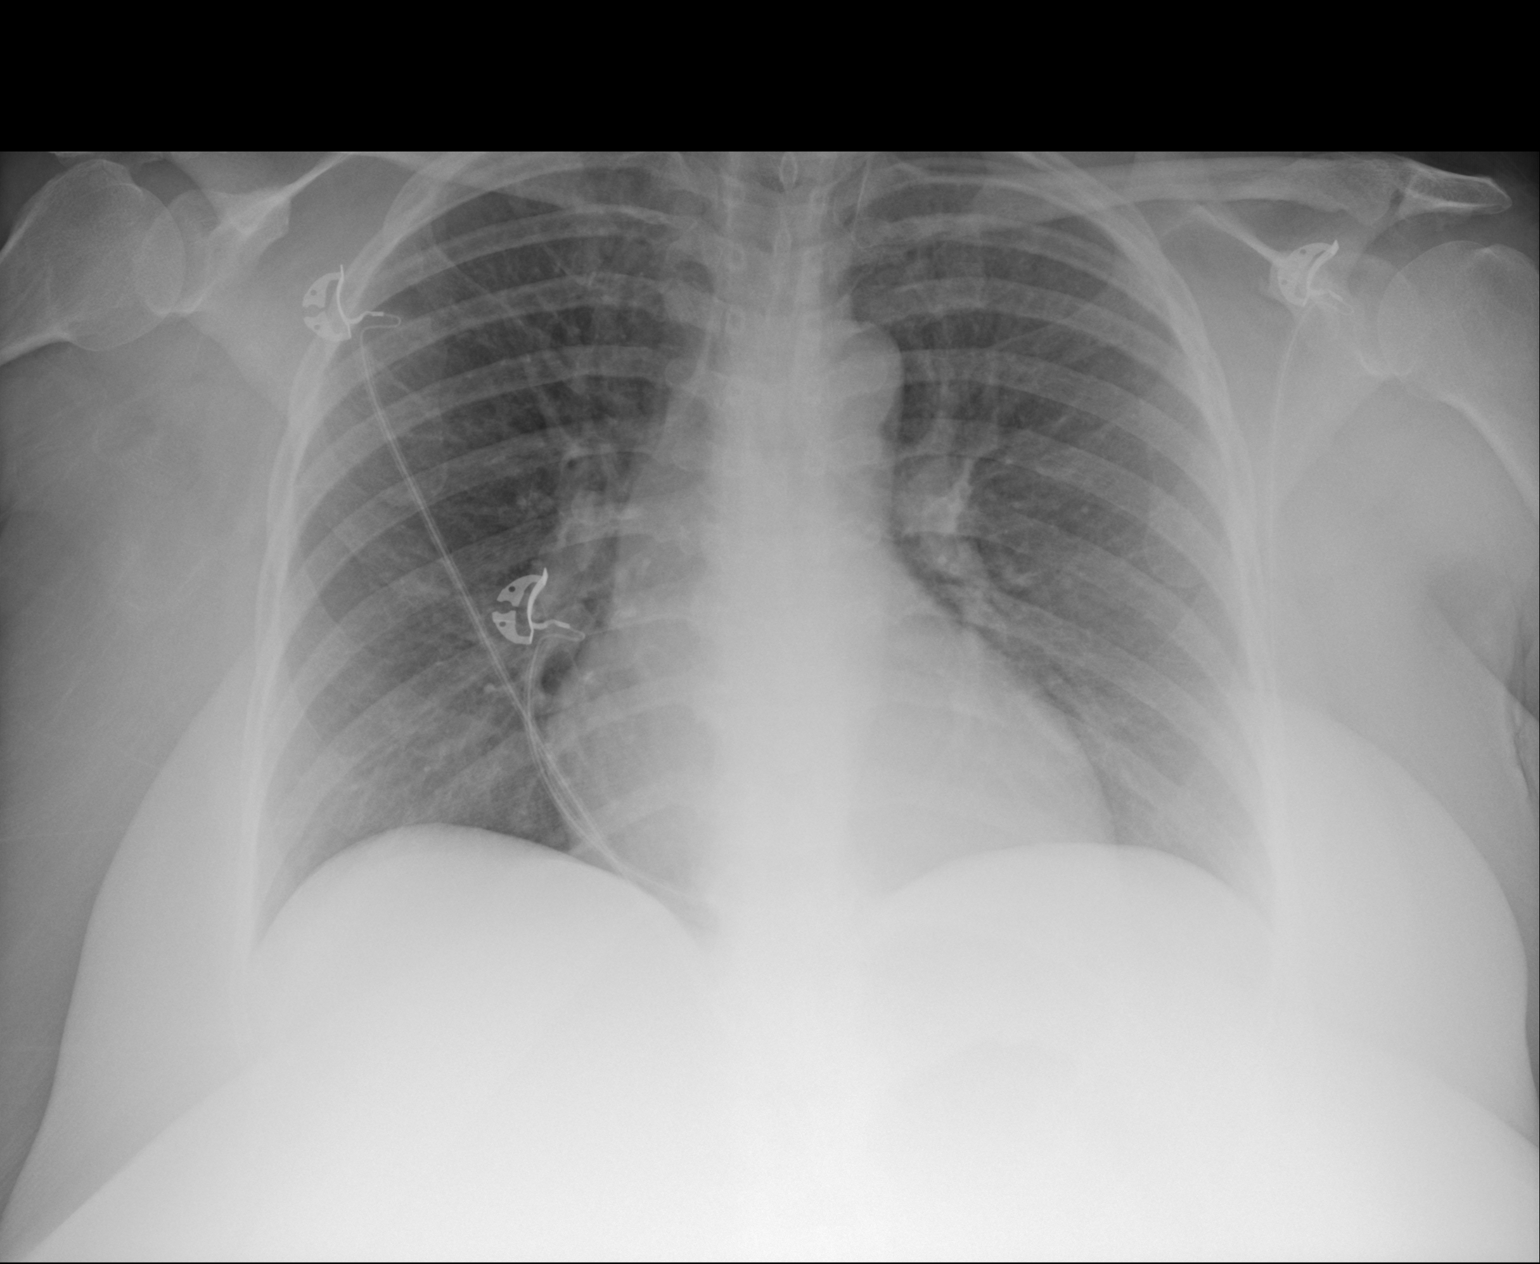

[1 of 1 positions shown; findings below may reference images not displayed]

FINDINGS: The heart size and mediastinal contours are within normal limits.
Both lungs are clear. The visualized skeletal structures are
unremarkable.
IMPRESSION: No active disease.

## 2017-04-04 DIAGNOSIS — L02413 Cutaneous abscess of right upper limb: Secondary | ICD-10-CM | POA: Diagnosis not present

## 2017-04-04 DIAGNOSIS — L02411 Cutaneous abscess of right axilla: Secondary | ICD-10-CM | POA: Diagnosis not present

## 2017-04-09 DIAGNOSIS — A4902 Methicillin resistant Staphylococcus aureus infection, unspecified site: Secondary | ICD-10-CM | POA: Diagnosis not present

## 2017-04-25 ENCOUNTER — Ambulatory Visit (INDEPENDENT_AMBULATORY_CARE_PROVIDER_SITE_OTHER): Payer: Medicare Other | Admitting: Family Medicine

## 2017-04-25 ENCOUNTER — Other Ambulatory Visit: Payer: Self-pay

## 2017-04-25 ENCOUNTER — Encounter (INDEPENDENT_AMBULATORY_CARE_PROVIDER_SITE_OTHER): Payer: Self-pay

## 2017-04-25 ENCOUNTER — Encounter: Payer: Self-pay | Admitting: Family Medicine

## 2017-04-25 VITALS — BP 130/84 | HR 85 | Temp 97.7°F | Resp 16 | Ht 62.0 in | Wt 214.8 lb

## 2017-04-25 DIAGNOSIS — Z23 Encounter for immunization: Secondary | ICD-10-CM

## 2017-04-25 DIAGNOSIS — I69359 Hemiplegia and hemiparesis following cerebral infarction affecting unspecified side: Secondary | ICD-10-CM

## 2017-04-25 DIAGNOSIS — I10 Essential (primary) hypertension: Secondary | ICD-10-CM

## 2017-04-25 DIAGNOSIS — G40909 Epilepsy, unspecified, not intractable, without status epilepticus: Secondary | ICD-10-CM

## 2017-04-25 DIAGNOSIS — E782 Mixed hyperlipidemia: Secondary | ICD-10-CM

## 2017-04-25 DIAGNOSIS — R739 Hyperglycemia, unspecified: Secondary | ICD-10-CM

## 2017-04-25 DIAGNOSIS — Z79899 Other long term (current) drug therapy: Secondary | ICD-10-CM | POA: Diagnosis not present

## 2017-04-25 MED ORDER — ASPIRIN EC 81 MG PO TBEC: 81.0000 mg | DELAYED_RELEASE_TABLET | Freq: Every day | ORAL | Status: AC

## 2017-04-25 NOTE — Progress Notes (Signed)
Patient ID: Patty [name], female    DOB: Jul 07, 1965, 52 y.o.   MRN: [id number]  Chief Complaint  Patient presents with  . Hyperlipidemia  . Hypertension    Allergies Patient has no known allergies.  Subjective:   Patty [name] is a 52 y.o. female who presents to [facility] today.  HPI [name] presents today as a new patient visit to establish care.  She reports that she had a stroke in 1990.  She is not sure if this was ischemic or hemorrhagic in character.  She is unable to give me specifics regarding her medical history.  She is on Depakote for seizure disorder.  She is unsure of how long she has been on this medication.  She reports that she has not had seizures in many years.  However she is unable to tell me when her last seizure was or what type of seizure was.  She is not reportedly followed by neurologist.  She reports she has never discussed coming off this medication with her primary care doctor.  She reports she would be too scared to come off and fear that she might have a seizure.  She reports that she takes all her medications as directed.  She has no known history of diabetes per her report.  She takes medication for her blood pressure.  She denies any chest pain, shortness of breath, swelling in her extremities.  She reports she does have right-sided weakness secondary to her stroke.  She reports that she has no pain in relation to her stroke.  She is able to drive a car and walk.  She reports that she functions pretty well at home.  She is on disability secondary to her condition.  She reports she is to work at [employer] another places.  She is interested in having someone come into her house to help clean for several hours a day.  She reports that because of her stroke she cannot always do things very well with her right hand.  She reports she used to be right-handed but has had to switch to using her left hand.  She lives with her daughter.  She reports  that she eats a healthy diet.  She does cook.  She reports she has trouble chopping vegetables at times.  He did complete speech therapy, Occupational Therapy, and physical therapy.  She is not interested in any therapy.  She does not use any braces or assistive devices.  Reports that her mood is good.  Energy is good.  Sleeps well at night.  Has had a hysterectomy in the past secondary to heavy menses.    Past Medical History:  Diagnosis Date  . Asthma   . DDD (degenerative disc disease), lumbar   . Depression   . HTN (hypertension)   . Hyperlipidemia   . Seizures (HCC)    remote  . Stroke [facility]) 1990   chronic right hemiparesis    Past Surgical History:  Procedure Laterality Date  . CESAREAN SECTION    . CHOLECYSTECTOMY N/A 07/13/2014   Procedure: LAPAROSCOPIC CONVERTED TO OPEN  CHOLECYSTECTOMY ;  Surgeon: Darnell Level, MD;  Location: [facility];  Service: General;  Laterality: N/A;  . COLONOSCOPY  12/24/2011   Procedure: COLONOSCOPY;  Surgeon: West Bali, MD;  [facility];  Service: Endoscopy;  Laterality: N/A;  2:00  . GALLBLADDER SURGERY     [name] /[place]  . PARTIAL HYSTERECTOMY  heavy bleeding    Family History  Problem Relation Age of Onset  . Stroke Father   . Diabetes Other   . Colon cancer Neg Hx   . Liver disease Neg Hx      Social History   Socioeconomic History  . Marital status: Divorced    Spouse name: None  . Number of children: 2  . Years of education: None  . Highest education level: None  Social Needs  . Financial resource strain: None  . Food insecurity - worry: None  . Food insecurity - inability: None  . Transportation needs - medical: None  . Transportation needs - non-medical: None  Occupational History  . Occupation: disabled    Associate Professormployer: UNEMPLOYED  Tobacco Use  . Smoking status: Never Smoker  . Smokeless tobacco: Never Used  Substance and Sexual Activity  . Alcohol use: Yes    Alcohol/week: 1.2 oz    Types: 2  Standard drinks or equivalent per week    Comment: couple of drinks on weekends  . Drug use: No  . Sexual activity: Not Currently  Other Topics Concern  . None  Social History Narrative   On disability. Used to work at [employer]/Works at [employer].   Divorced. 2 children.   Enjoys reading.     Review of Systems  Constitutional: Negative for activity change, appetite change and fever.  Eyes: Negative for visual disturbance.  Respiratory: Negative for cough, chest tightness and shortness of breath.   Cardiovascular: Negative for chest pain, palpitations and leg swelling.  Gastrointestinal: Negative for abdominal pain, nausea and vomiting.  Genitourinary: Negative for dysuria, frequency and urgency.  Neurological: Negative for dizziness, syncope and light-headedness.  Hematological: Negative for adenopathy.     Objective:   BP 130/84 (BP Location: Left Arm, Patient Position: Sitting, Cuff Size: Normal)   Pulse 85   Temp 97.7 F (36.5 C) (Temporal)   Resp 16   Ht 5\' 2"  (1.575 m)   Wt 214 lb 12 oz (97.4 kg)   SpO2 95%   BMI 39.28 kg/m   Physical Exam  Constitutional: She is oriented to person, place, and time. She appears well-developed and well-nourished.  HENT:  Head: Normocephalic and atraumatic.  Mouth/Throat: Oropharynx is clear and moist.  Eyes: Conjunctivae and EOM are normal. Pupils are equal, round, and reactive to light. No scleral icterus.  Neck: Normal range of motion. Neck supple. No JVD present. No tracheal deviation present. No thyromegaly present.  Cardiovascular: Normal rate and regular rhythm.  Pulmonary/Chest: Effort normal and breath sounds normal.  Lymphadenopathy:    She has no cervical adenopathy.  Neurological: She is alert and oriented to person, place, and time. She exhibits normal muscle tone.  Right-sided weakness.  Grip strength is still strong in right hand but weaker than left.  Gait slightly ataxic.  Speech slightly dysarthric.    Skin: Skin is warm and dry. Capillary refill takes less than 2 seconds.  Psychiatric: She has a normal mood and affect. Her behavior is normal. Judgment and thought content normal.  Vitals reviewed.    Assessment and Plan  1. Immunization due - Flu Vaccine QUAD 6+ mos PF IM (Fluarix Quad PF) Patient unsure about immunization status related to pneumococcal vaccinations.  Will request records from her previous physician. 2. Hyperglycemia Patient at risk for diabetes with no known history of diabetes but no labs indicative of a hemoglobin A1c in the past.  Check labs today. - Hemoglobin A1c  3. Essential  hypertension Blood pressure under good control.  Continue current medications.  Patient was counseled regarding risks versus benefits of medication.  We did discuss that she needs a BMP performed every 6 months for creatinine and potassium levels. - Basic metabolic panel  4. Seizure disorder [facility]) Discussed with patient that I would like to see her records regarding her seizure medication.  She is unsure as to when her last seizure was but believes it was greater than 10 years ago.  She is very hesitant to seek evaluation regarding discontinuation of medication.  We will review records once received them and discuss at next visit.  5. High risk medication use Check labs due to chronic anticonvulsant use. - Hepatic function panel - CBC with Differential/Platelet  6. Mixed hyperlipidemia Will check cholesterol to make sure her LDL is less than 70.  Plan to titrate medication as needed. - Lipid panel  7. CVA, old, hemiparesis (HCC) Continue aspirin for secondary prevention.  Will try to obtain records and CT scan to determine etiology of CVA.  Patient does not desire any continued therapy or reportedly need any assistive devices.  She is interested in possibly receiving the help of an aide to help her clean her house.  I asked her to please check with her insurance to see if this would be  covered but I did not believe that due to her functional status of being able to get out shop for herself drive a car that she would meet criteria to have someone come in to clean her house and it be covered by her medical insurance.  She voiced understanding. - aspirin EC 81 MG tablet; Take 1 tablet (81 mg total) by mouth daily.  Return in about 4 weeks (around 05/23/2017) for follow up. Aliene Beams, MD 04/25/2017

## 2017-04-26 LAB — LIPID PANEL
CHOL/HDL RATIO: 4.2 (calc) (ref <5.0)
CHOLESTEROL: 220 mg/dL — AB (ref <200)
HDL: 53 mg/dL (ref >50)
LDL CHOLESTEROL (CALC): 138 mg/dL — AB
Non-HDL Cholesterol (Calc): 167 mg/dL (calc) — ABNORMAL HIGH (ref <130)
Triglycerides: 155 mg/dL — ABNORMAL HIGH (ref <150)

## 2017-04-26 LAB — BASIC METABOLIC PANEL
BUN: 15 mg/dL (ref 7–25)
CALCIUM: 10.2 mg/dL (ref 8.6–10.4)
CHLORIDE: 102 mmol/L (ref 98–110)
CO2: 28 mmol/L (ref 20–32)
Creat: 0.82 mg/dL (ref 0.50–1.05)
Glucose, Bld: 108 mg/dL — ABNORMAL HIGH (ref 65–99)
Potassium: 5.4 mmol/L — ABNORMAL HIGH (ref 3.5–5.3)
SODIUM: 141 mmol/L (ref 135–146)

## 2017-04-26 LAB — HEPATIC FUNCTION PANEL
AG Ratio: 1.1 (calc) (ref 1.0–2.5)
ALBUMIN MSPROF: 4.2 g/dL (ref 3.6–5.1)
ALT: 33 U/L — ABNORMAL HIGH (ref 6–29)
AST: 17 U/L (ref 10–35)
Alkaline phosphatase (APISO): 99 U/L (ref 33–130)
BILIRUBIN DIRECT: 0.1 mg/dL (ref 0.0–0.2)
GLOBULIN: 3.8 g/dL — AB (ref 1.9–3.7)
Indirect Bilirubin: 0.2 mg/dL (calc) (ref 0.2–1.2)
Total Bilirubin: 0.3 mg/dL (ref 0.2–1.2)
Total Protein: 8 g/dL (ref 6.1–8.1)

## 2017-04-26 LAB — HEMOGLOBIN A1C
EAG (MMOL/L): 7.1 (calc)
HEMOGLOBIN A1C: 6.1 %{Hb} — AB (ref <5.7)
MEAN PLASMA GLUCOSE: 128 (calc)

## 2017-04-26 LAB — CBC WITH DIFFERENTIAL/PLATELET
BASOS ABS: 62 {cells}/uL (ref 0–200)
Basophils Relative: 1 %
EOS ABS: 229 {cells}/uL (ref 15–500)
Eosinophils Relative: 3.7 %
HCT: 40.9 % (ref 35.0–45.0)
Hemoglobin: 14.2 g/dL (ref 11.7–15.5)
Lymphs Abs: 2678 cells/uL (ref 850–3900)
MCH: 30.5 pg (ref 27.0–33.0)
MCHC: 34.7 g/dL (ref 32.0–36.0)
MCV: 88 fL (ref 80.0–100.0)
MONOS PCT: 6.9 %
MPV: 12.4 fL (ref 7.5–12.5)
Neutro Abs: 2802 cells/uL (ref 1500–7800)
Neutrophils Relative %: 45.2 %
PLATELETS: 256 10*3/uL (ref 140–400)
RBC: 4.65 10*6/uL (ref 3.80–5.10)
RDW: 12.5 % (ref 11.0–15.0)
TOTAL LYMPHOCYTE: 43.2 %
WBC mixed population: 428 cells/uL (ref 200–950)
WBC: 6.2 10*3/uL (ref 3.8–10.8)

## 2017-04-29 ENCOUNTER — Encounter: Payer: Self-pay | Admitting: Family Medicine

## 2017-05-23 ENCOUNTER — Ambulatory Visit (INDEPENDENT_AMBULATORY_CARE_PROVIDER_SITE_OTHER): Payer: Medicare Other | Admitting: Family Medicine

## 2017-05-23 ENCOUNTER — Other Ambulatory Visit: Payer: Self-pay

## 2017-05-23 ENCOUNTER — Encounter: Payer: Self-pay | Admitting: Family Medicine

## 2017-05-23 DIAGNOSIS — Z114 Encounter for screening for human immunodeficiency virus [HIV]: Secondary | ICD-10-CM

## 2017-05-23 DIAGNOSIS — R0681 Apnea, not elsewhere classified: Secondary | ICD-10-CM | POA: Diagnosis not present

## 2017-05-23 DIAGNOSIS — E875 Hyperkalemia: Secondary | ICD-10-CM | POA: Diagnosis not present

## 2017-05-23 DIAGNOSIS — G40909 Epilepsy, unspecified, not intractable, without status epilepticus: Secondary | ICD-10-CM | POA: Diagnosis not present

## 2017-05-23 DIAGNOSIS — R635 Abnormal weight gain: Secondary | ICD-10-CM | POA: Diagnosis not present

## 2017-05-23 DIAGNOSIS — I10 Essential (primary) hypertension: Secondary | ICD-10-CM

## 2017-05-23 NOTE — Progress Notes (Signed)
Patient ID: Patty [name], female    DOB: 12-01-1965, 52 y.o.   MRN: [id number]  Chief Complaint  Patient presents with  . Follow-up    Allergies Patient has no known allergies.  Subjective:   Joesphine Arna MediciM [name] is a 52 y.o. female who presents to [facility] today.  HPI Ms. Patty [name] presents today with her daughter for a follow-up visit.  She has been seen in our office one time prior and I have had difficulty obtaining a detailed medical history from her.  She is unsure of when she was diagnosed with a stroke, seizure disorder, asthma, or her other medical issues.  Her daughter is unable to give me really any additional information.  Ms. Patty [name] reports that she has not had a seizure in a long time and has been on Depakote for quite some time.  She reports that she is not always compliant with her medication and did not take her Depakote on several occasions over the past month and did not feel great.    Patient reports reports that she has not been compliant with her cholesterol medication and may miss the medication at least half the days of the week.  She reports she does take her aspirin.  She has been taking her blood pressure medication.  She does report that she has gained a good bit of weight over the past year.  She is here to discuss her labs.  Her labs do show an elevated blood sugar and an elevated hemoglobin A1c.  Her daughter reports that she does eat a diet which is filled with a lot of junk food and sugar.  Patient does reports she likes bread, rice, pasta, potatoes.  She does not do any exercise.  She reports that she gets very winded with any amount of exercise.  Her daughter reports that she cannot even walk in the grocery store without feeling winded.  Patient reports that she thought she was winded due to her lungs.  She reports that she did not have asthma as a child.  She reports she was put on the inhaler several years ago for her breathing.  She has never smoked in  her life.  She does not get any swelling in her legs.  She denies any orthopnea.  She denies any chest pain or palpitations.    Past Medical History:  Diagnosis Date  . Asthma   . DDD (degenerative disc disease), lumbar   . Depression   . HTN (hypertension)   . Hyperlipidemia   . Seizures (HCC)    remote  . Stroke [facility](HCC) 1990   chronic right hemiparesis    Past Surgical History:  Procedure Laterality Date  . CESAREAN SECTION    . CHOLECYSTECTOMY N/A 07/13/2014   Procedure: LAPAROSCOPIC CONVERTED TO OPEN  CHOLECYSTECTOMY ;  Surgeon: Darnell Levelodd Gerkin, MD;  Location: [facility];  Service: General;  Laterality: N/A;  . COLONOSCOPY  12/24/2011   Procedure: COLONOSCOPY;  Surgeon: West BaliSandi L Fields, MD;  [facility];  Service: Endoscopy;  Laterality: N/A;  2:00  . GALLBLADDER SURGERY     Patty [name] /[place]  . PARTIAL HYSTERECTOMY     heavy bleeding    Family History  Problem Relation Age of Onset  . Stroke Father   . Diabetes Other   . Colon cancer Neg Hx   . Liver disease Neg Hx      Social History   Socioeconomic History  . Marital status: Divorced  Spouse name: None  . Number of children: 2  . Years of education: None  . Highest education level: None  Social Needs  . Financial resource strain: None  . Food insecurity - worry: None  . Food insecurity - inability: None  . Transportation needs - medical: None  . Transportation needs - non-medical: None  Occupational History  . Occupation: disabled    Associate Professor: UNEMPLOYED  Tobacco Use  . Smoking status: Never Smoker  . Smokeless tobacco: Never Used  Substance and Sexual Activity  . Alcohol use: Yes    Alcohol/week: 1.2 oz    Types: 2 Standard drinks or equivalent per week    Comment: couple of drinks on weekends  . Drug use: No  . Sexual activity: Not Currently  Other Topics Concern  . None  Social History Narrative   On disability. Used to work at [employer] at [employer].   Divorced. 2  children.   Enjoys reading.    Current Outpatient Medications on File Prior to Visit  Medication Sig Dispense Refill  . aspirin EC 81 MG tablet Take 1 tablet (81 mg total) by mouth daily.    . budesonide-formoterol (SYMBICORT) 80-4.5 MCG/ACT inhaler Inhale 2 puffs into the lungs every 12 (twelve) hours as needed. Shortness of breath    . divalproex (DEPAKOTE) 250 MG DR tablet Take 250 mg by mouth 2 (two) times daily.     Patty [name] levalbuterol (XOPENEX HFA) 45 MCG/ACT inhaler Inhale 1-2 puffs into the lungs every 4 (four) hours as needed. Shortness of breath    . pravastatin (PRAVACHOL) 20 MG tablet Take 20 mg by mouth at bedtime.     . verapamil (CALAN) 120 MG tablet Take 1 tablet by mouth daily.    Patty [name] tetrahydrozoline (EYE DROPS) 0.05 % ophthalmic solution Place 1 drop into both eyes daily as needed. Dry Eyes     No current facility-administered medications on file prior to visit.     Review of Systems  Constitutional: Positive for unexpected weight change. Negative for fatigue and fever.  HENT: Negative for trouble swallowing and voice change.   Respiratory: Positive for shortness of breath. Negative for choking and chest tightness.   Cardiovascular: Negative for chest pain, palpitations and leg swelling.  Gastrointestinal: Negative for abdominal pain, constipation, nausea and vomiting.  Genitourinary: Negative for dysuria, flank pain and frequency.  Skin: Negative for rash.  Neurological: Negative for syncope, facial asymmetry, light-headedness and numbness.  Hematological: Negative for adenopathy.  Psychiatric/Behavioral: Negative for dysphoric mood.     Objective:   BP 130/80 (BP Location: Left Arm, Patient Position: Sitting, Cuff Size: Normal)   Pulse 87   Temp 98.4 F (36.9 C) (Temporal)   Resp 16   Ht 5\' 2"  (1.575 m)   Wt 222 lb 12 oz (101 kg)   SpO2 94%   BMI 40.74 kg/m   Physical Exam  Constitutional: She is oriented to person, place, and time. She appears well-developed  and well-nourished.  Pleasant African-American female, appearance older than her stated age.  Speech somewhat dysarthric and difficult to understand at times.  Mood and affect not completely within the normal range.  Questionable cognitive deficits.  Patient is a very poor historian and difficult to obtain history.    HENT:  Head: Normocephalic and atraumatic.  Nose: Nose normal.  Mouth/Throat: Oropharynx is clear and moist.  Eyes: EOM are normal. Pupils are equal, round, and reactive to light.  Neck: Normal range of motion. Neck supple. No  JVD present. No tracheal deviation present.  Cardiovascular: Normal rate and regular rhythm.  Pulmonary/Chest: Effort normal and breath sounds normal. She has no wheezes.  Abdominal: Soft. Bowel sounds are normal.  Musculoskeletal: Normal range of motion.  Lymphadenopathy:    She has no cervical adenopathy.  Neurological: She is alert and oriented to person, place, and time.  Skin: Skin is warm and dry. Capillary refill takes less than 2 seconds.  Vitals reviewed.    Assessment and Plan  1. Breathlessness on exertion Patient with history of asthma per report but evidently developed this at late stage.  She is patient is a very poor historian and most of the information that can be gleaned from the chart it is no different than what I know.  Patient has not had any formal test giving her a diagnosis of asthma but yet she is still experiencing shortness of breath with exertion.  At this time will check for any underlying structural heart issues and refer for cardiology evaluation.  She obviously has a history of vascular disease given the fact that she has had 2 strokes at her young age.  We did discuss today that the best way to prevent future strokes is for her to have good control of her blood pressure and cholesterol.  She does report that she has not been compliant with her cholesterol medication.  At this time we will plan on checking her cholesterol  again in 3 months.  If at that time her LDL is not less than 70 we will change her medication and increase it. - Ambulatory referral to Cardiology - ECHOCARDIOGRAM COMPLETE; Future  2. Screening for HIV (human immunodeficiency virus) Screening performed. - HIV antibody (with reflex)  3. Hyperkalemia Did discuss with patient that her last lab work revealed an elevated potassium.  We will plan to check that today.  Of note she is not on any medication that could elevate her potassium. - Basic metabolic panel  4. Seizure disorder [facility]) Discussed with patient that if she is going to continue the Depakote that I would like for her to see neurology and have an EEG done that document she has a seizure disorder and the need for the medication.  She is willing to follow-up with neurology.  However, until then counseling and compliance with her medication was recommended. - Ambulatory referral to Neurology  5. Weight gain Did discuss with patient that she is prediabetic and she is at risk for diabetes.  Diet, exercise and weight loss modifications discussed with her today.  It is recommended that she make some dietary changes.  Will plan to check her TSH today due to her history of weight gain. - TSH  6. Essential hypertension Pressures controlled at this time.  She is to continue her medications as directed and bring in all her bottles at her next visit.  I have reviewed most of the records which are in the epic system.  We will try to obtain some records from her PCP. Return in about 4 weeks (around 06/20/2017) for Follow-up. Aliene Beams, MD 2/22/20191

## 2017-05-24 LAB — HIV ANTIBODY (ROUTINE TESTING W REFLEX): HIV: NONREACTIVE

## 2017-05-24 LAB — BASIC METABOLIC PANEL
BUN: 22 mg/dL (ref 7–25)
CHLORIDE: 104 mmol/L (ref 98–110)
CO2: 26 mmol/L (ref 20–32)
Calcium: 9.9 mg/dL (ref 8.6–10.4)
Creat: 0.7 mg/dL (ref 0.50–1.05)
Glucose, Bld: 93 mg/dL (ref 65–139)
POTASSIUM: 4.8 mmol/L (ref 3.5–5.3)
Sodium: 142 mmol/L (ref 135–146)

## 2017-05-24 LAB — TSH: TSH: 2.45 m[IU]/L

## 2017-05-26 ENCOUNTER — Other Ambulatory Visit: Payer: Self-pay | Admitting: Family Medicine

## 2017-05-26 NOTE — Telephone Encounter (Signed)
Seen 2 22 19 

## 2017-05-27 ENCOUNTER — Encounter: Payer: Self-pay | Admitting: Family Medicine

## 2017-06-09 ENCOUNTER — Encounter: Payer: Self-pay | Admitting: Family Medicine

## 2017-06-13 ENCOUNTER — Encounter: Payer: Self-pay | Admitting: Cardiovascular Disease

## 2017-06-13 ENCOUNTER — Encounter: Payer: Self-pay | Admitting: *Deleted

## 2017-06-13 ENCOUNTER — Ambulatory Visit (INDEPENDENT_AMBULATORY_CARE_PROVIDER_SITE_OTHER): Payer: Medicare Other | Admitting: Cardiovascular Disease

## 2017-06-13 ENCOUNTER — Other Ambulatory Visit: Payer: Self-pay

## 2017-06-13 VITALS — HR 70 | Ht 62.0 in | Wt 224.0 lb

## 2017-06-13 DIAGNOSIS — I10 Essential (primary) hypertension: Secondary | ICD-10-CM

## 2017-06-13 DIAGNOSIS — R0609 Other forms of dyspnea: Secondary | ICD-10-CM

## 2017-06-13 DIAGNOSIS — Z8673 Personal history of transient ischemic attack (TIA), and cerebral infarction without residual deficits: Secondary | ICD-10-CM | POA: Diagnosis not present

## 2017-06-13 DIAGNOSIS — E785 Hyperlipidemia, unspecified: Secondary | ICD-10-CM | POA: Diagnosis not present

## 2017-06-13 NOTE — Patient Instructions (Addendum)
Medication Instructions:  Continue all current medications.  Labwork: none  Testing/Procedures:  Your physician has requested that you have a lexiscan myoview. For further information please visit [email or web address]. Please follow instruction sheet, as given. Your physician has requested that you have an echocardiogram. Echocardiography is a painless test that uses sound waves to create images of your heart. It provides your doctor with information about the size and shape of your heart and how well your heart's chambers and valves are working. This procedure takes approximately one hour. There are no restrictions for this procedure.  Office will contact with results via phone or letter.    Follow-Up: 6 weeks   Any Other Special Instructions Will Be Listed Below (If Applicable).  If you need a refill on your cardiac medications before your next appointment, please call your pharmacy.

## 2017-06-13 NOTE — Progress Notes (Signed)
CARDIOLOGY CONSULT NOTE  Patient ID: Patty [name] MRN: [id number] DOB/AGE: 1965/07/09 52 y.o.  Admit date: (Not on file) Primary Physician: Aliene Beams, MD Referring Physician: Aliene Beams, MD  Reason for Consultation: Exertional dyspnea  HPI: Patty [name] is a 52 y.o. female who is being seen today for the evaluation of exertional dyspnea at the request of Aliene Beams, MD.   I reviewed notes from her PCP.  Past medical history includes CVA and seizure disorder.  She is reportedly a very poor historian with probable cognitive deficits.  She is a non-smoker.  She had asthma as a child.  She denies exertional chest pain as well as leg swelling, orthopnea, palpitations, and paroxysmal nocturnal dyspnea.  She adheres to a poor diet and apparently eats a lot of junk food and starches.  She does not do any exercise.  Lipid panel 04/25/17: Total cholesterol 220, HDL 53, triglycerides 155, LDL 138.  Hemoglobin A1c 6.1%.  Most recent CBC and basic metabolic panel were normal on 07/09/79.  ECG performed today which I reviewed demonstrates sinus rhythm with late R wave transition and nonspecific T wave abnormalities.  She is here by herself.  She tells me she has had exertional dyspnea for years.  She has shortness of breath when doing simple household chores.  She once had a "cold sensation in her chest "which lasted several seconds about 2 weeks ago.  It was nonexertional.  She said she uses an inhaler which helps her shortness of breath.  She also told me she is considering breast reduction surgery because when she lifts her breasts at night while lying down her symptoms are improved.  She had a stroke in her 18s.      No Known Allergies  Current Outpatient Medications  Medication Sig Dispense Refill  . aspirin EC 81 MG tablet Take 1 tablet (81 mg total) by mouth daily.    . divalproex (DEPAKOTE) 250 MG DR tablet Take 250 mg by mouth 2 (two) times daily.     [name]  levalbuterol (XOPENEX HFA) 45 MCG/ACT inhaler Inhale 1-2 puffs into the lungs every 4 (four) hours as needed. Shortness of breath    . pravastatin (PRAVACHOL) 20 MG tablet Take 20 mg by mouth at bedtime.     . SYMBICORT 80-4.5 MCG/ACT inhaler INHALE 2 PUFFS BY MOUTH EVERY 12 HOURS 10.2 g 0  . tetrahydrozoline (EYE DROPS) 0.05 % ophthalmic solution Place 1 drop into both eyes daily as needed. Dry Eyes    . verapamil (CALAN) 120 MG tablet Take 1 tablet by mouth daily.     No current facility-administered medications for this visit.     Past Medical History:  Diagnosis Date  . Asthma   . DDD (degenerative disc disease), lumbar   . Depression   . HTN (hypertension)   . Hyperlipidemia   . Seizures (HCC)    remote  . Stroke [facility]) 1990   chronic right hemiparesis    Past Surgical History:  Procedure Laterality Date  . CESAREAN SECTION    . CHOLECYSTECTOMY N/A 07/13/2014   Procedure: LAPAROSCOPIC CONVERTED TO OPEN  CHOLECYSTECTOMY ;  Surgeon: Darnell Level, MD;  Location: [facility];  Service: General;  Laterality: N/A;  . COLONOSCOPY  12/24/2011   Procedure: COLONOSCOPY;  Surgeon: West Bali, MD;  [facility];  Service: Endoscopy;  Laterality: N/A;  2:00  . GALLBLADDER SURGERY     [name] /[place]  . PARTIAL  HYSTERECTOMY     heavy bleeding    Social History   Socioeconomic History  . Marital status: Divorced    Spouse name: Not on file  . Number of children: 2  . Years of education: Not on file  . Highest education level: Not on file  Social Needs  . Financial resource strain: Not on file  . Food insecurity - worry: Not on file  . Food insecurity - inability: Not on file  . Transportation needs - medical: Not on file  . Transportation needs - non-medical: Not on file  Occupational History  . Occupation: disabled    Associate Professormployer: UNEMPLOYED  Tobacco Use  . Smoking status: Never Smoker  . Smokeless tobacco: Never Used  Substance and Sexual Activity  . Alcohol  use: Yes    Alcohol/week: 1.2 oz    Types: 2 Standard drinks or equivalent per week    Comment: couple of drinks on weekends  . Drug use: No  . Sexual activity: Not Currently  Other Topics Concern  . Not on file  Social History Narrative   On disability. Used to work at [employer]/Works at [employer].   Divorced. 2 children.   Enjoys reading.      No family history of premature CAD in 1st degree relatives.  No outpatient medications have been marked as taking for the 06/13/17 encounter (Office Visit) with Laqueta LindenKoneswaran, Shandelle Borrelli A, MD.      Review of systems complete and found to be negative unless listed above in HPI    Physical exam Height 5\' 2"  (1.575 m), weight 224 lb (101.6 kg). General: NAD Neck: No JVD, no thyromegaly or thyroid nodule.  Lungs: Clear to auscultation bilaterally with normal respiratory effort. CV: Nondisplaced PMI. Regular rate and rhythm, normal S1/S2, no S3/S4, no murmur.  No peripheral edema.  No carotid bruit.    Abdomen: Soft, nontender, no distention.  Skin: Intact without lesions or rashes.  Neurologic: Alert and oriented x 3.  Psych: Normal affect. Extremities: No clubbing or cyanosis.  HEENT: Normal.   ECG: Most recent ECG reviewed.   Labs: Lab Results  Component Value Date/Time   K 4.8 05/23/2017 02:20 PM   K 3.9 10/17/2011 03:11 PM   BUN 22 05/23/2017 02:20 PM   BUN 6 10/17/2011 03:11 PM   CREATININE 0.70 05/23/2017 02:20 PM   ALT 33 (H) 04/25/2017 02:24 PM   TSH 2.45 05/23/2017 02:20 PM   HGB 14.2 04/25/2017 02:24 PM     Lipids: Lab Results  Component Value Date/Time   LDLCALC 138 (H) 04/25/2017 02:24 PM   CHOL 220 (H) 04/25/2017 02:24 PM   TRIG 155 (H) 04/25/2017 02:24 PM   HDL 53 04/25/2017 02:24 PM        ASSESSMENT AND PLAN:  1.  Exertional dyspnea: It is difficult to say whether her symptoms are simply due to cardiopulmonary deconditioning or if there is an ischemic component.  She had a stroke in her early 3220s  as well as hypertension and hypercholesterolemia thus ischemic testing is warranted. I will proceed with a nuclear myocardial perfusion imaging study to evaluate for ischemic heart disease (Lexiscan Myoview). I will order a 2-D echocardiogram with Doppler to evaluate cardiac structure, function, and regional wall motion.  2.  Hypertension: Blood pressure is 137/82 today.  Continue verapamil.  3.  Hypercholesterolemia: Lipids reviewed above.  Continue pravastatin.  4.  History of CVA: Continue aspirin and statin.  Lipids will be rechecked by her PCP to  see if LDL is at goal.     Disposition: Follow up in 6 weeks.  Signed: Prentice Docker, M.D., F.A.C.C.  06/13/2017, 2:46 PM

## 2017-06-19 ENCOUNTER — Ambulatory Visit (HOSPITAL_COMMUNITY)
Admission: RE | Admit: 2017-06-19 | Discharge: 2017-06-19 | Disposition: A | Discharge status: Home / Self Care | Payer: Medicare Other | Source: Ambulatory Visit | Attending: Cardiovascular Disease | Admitting: Cardiovascular Disease

## 2017-06-19 DIAGNOSIS — J45909 Unspecified asthma, uncomplicated: Secondary | ICD-10-CM | POA: Insufficient documentation

## 2017-06-19 DIAGNOSIS — I10 Essential (primary) hypertension: Secondary | ICD-10-CM | POA: Diagnosis not present

## 2017-06-19 DIAGNOSIS — R0609 Other forms of dyspnea: Secondary | ICD-10-CM | POA: Diagnosis not present

## 2017-06-19 DIAGNOSIS — Z8673 Personal history of transient ischemic attack (TIA), and cerebral infarction without residual deficits: Secondary | ICD-10-CM | POA: Insufficient documentation

## 2017-06-19 DIAGNOSIS — E785 Hyperlipidemia, unspecified: Secondary | ICD-10-CM | POA: Diagnosis not present

## 2017-06-19 NOTE — Progress Notes (Signed)
*  PRELIMINARY RESULTS* Echocardiogram 2D Echocardiogram has been performed.  Patty [name], Patty [name] 06/19/2017, 11:00 AM

## 2017-06-20 ENCOUNTER — Encounter (HOSPITAL_COMMUNITY): Payer: Self-pay

## 2017-06-20 ENCOUNTER — Encounter (HOSPITAL_COMMUNITY)
Admission: RE | Admit: 2017-06-20 | Discharge: 2017-06-20 | Disposition: A | Discharge status: Home / Self Care | Payer: Medicare Other | Source: Ambulatory Visit | Attending: Cardiovascular Disease | Admitting: Cardiovascular Disease

## 2017-06-20 ENCOUNTER — Encounter (HOSPITAL_BASED_OUTPATIENT_CLINIC_OR_DEPARTMENT_OTHER)
Admission: RE | Admit: 2017-06-20 | Discharge: 2017-06-20 | Disposition: A | Discharge status: Home / Self Care | Payer: Medicare Other | Source: Ambulatory Visit | Attending: Cardiovascular Disease | Admitting: Cardiovascular Disease

## 2017-06-20 DIAGNOSIS — R0609 Other forms of dyspnea: Secondary | ICD-10-CM | POA: Insufficient documentation

## 2017-06-20 LAB — NM MYOCAR MULTI W/SPECT W/WALL MOTION / EF
CHL CUP NUCLEAR SRS: 2
CHL CUP NUCLEAR SSS: 2
LHR: 0.66
LV sys vol: 34 mL
LVDIAVOL: 72 mL (ref 46–106)
NUC STRESS TID: 1.07
Peak HR: 115 {beats}/min
Rest HR: 91 {beats}/min
SDS: 0

## 2017-06-20 MED ORDER — SODIUM CHLORIDE 0.9% FLUSH
INTRAVENOUS | Status: AC
  Administered 2017-06-20: 10 mL via INTRAVENOUS
  Filled 2017-06-20: qty 10

## 2017-06-20 MED ORDER — TECHNETIUM TC 99M TETROFOSMIN IV KIT
30.0000 | PACK | Freq: Once | INTRAVENOUS | Status: AC | PRN
  Administered 2017-06-20: 30 via INTRAVENOUS

## 2017-06-20 MED ORDER — REGADENOSON 0.4 MG/5ML IV SOLN
INTRAVENOUS | Status: AC
  Administered 2017-06-20: 0.4 mg via INTRAVENOUS
  Filled 2017-06-20: qty 5

## 2017-06-20 MED ORDER — TECHNETIUM TC 99M TETROFOSMIN IV KIT
10.0000 | PACK | Freq: Once | INTRAVENOUS | Status: AC | PRN
  Administered 2017-06-20: 10 via INTRAVENOUS

## 2017-06-22 ENCOUNTER — Other Ambulatory Visit: Payer: Self-pay | Admitting: Family Medicine

## 2017-07-02 ENCOUNTER — Telehealth: Payer: Self-pay | Admitting: *Deleted

## 2017-07-02 NOTE — Telephone Encounter (Signed)
STRESS TEST -  Notes recorded by Laqueta LindenKoneswaran, Suresh A, MD on 06/23/2017 at 9:11 AM EDT Low risk for blockages.   ECHO-  Notes recorded by Laqueta LindenKoneswaran, Suresh A, MD on 06/19/2017 at 11:16 AM EDT Pumping function is normal.

## 2017-07-02 NOTE — Telephone Encounter (Signed)
Notes recorded by Lesle ChrisHill, Chris Cripps G, LPN on 1/6/10964/05/2017 at 3:43 PM EDT Patient notified on 06/26/2017. Copy to pmd. Follow up scheduled for 08/14/2017. ------

## 2017-07-04 ENCOUNTER — Ambulatory Visit: Payer: Medicare Other | Admitting: Family Medicine

## 2017-07-06 ENCOUNTER — Emergency Department (HOSPITAL_COMMUNITY)
Admission: EM | Admit: 2017-07-06 | Discharge: 2017-07-06 | Disposition: A | Discharge status: Home / Self Care | Payer: Medicare Other | Attending: Emergency Medicine | Admitting: Emergency Medicine

## 2017-07-06 ENCOUNTER — Emergency Department (HOSPITAL_COMMUNITY): Payer: Medicare Other

## 2017-07-06 ENCOUNTER — Other Ambulatory Visit: Payer: Self-pay

## 2017-07-06 ENCOUNTER — Encounter (HOSPITAL_COMMUNITY): Payer: Self-pay | Admitting: Emergency Medicine

## 2017-07-06 DIAGNOSIS — R0602 Shortness of breath: Secondary | ICD-10-CM | POA: Diagnosis not present

## 2017-07-06 DIAGNOSIS — I10 Essential (primary) hypertension: Secondary | ICD-10-CM | POA: Insufficient documentation

## 2017-07-06 DIAGNOSIS — J069 Acute upper respiratory infection, unspecified: Secondary | ICD-10-CM | POA: Diagnosis not present

## 2017-07-06 DIAGNOSIS — J45909 Unspecified asthma, uncomplicated: Secondary | ICD-10-CM | POA: Diagnosis not present

## 2017-07-06 DIAGNOSIS — Z7982 Long term (current) use of aspirin: Secondary | ICD-10-CM | POA: Diagnosis not present

## 2017-07-06 DIAGNOSIS — R05 Cough: Secondary | ICD-10-CM | POA: Insufficient documentation

## 2017-07-06 DIAGNOSIS — Z79899 Other long term (current) drug therapy: Secondary | ICD-10-CM | POA: Insufficient documentation

## 2017-07-06 DIAGNOSIS — R079 Chest pain, unspecified: Secondary | ICD-10-CM | POA: Diagnosis not present

## 2017-07-06 MED ORDER — DOXYCYCLINE HYCLATE 100 MG PO CAPS: 100.0000 mg | ORAL_CAPSULE | Freq: Two times a day (BID) | ORAL | 0 refills | Status: DC

## 2017-07-06 MED ORDER — PREDNISONE 20 MG PO TABS: 40.0000 mg | ORAL_TABLET | Freq: Every day | ORAL | 0 refills | Status: DC

## 2017-07-06 MED ORDER — GUAIFENESIN-DM 100-10 MG/5ML PO SYRP: 5.0000 mL | ORAL_SOLUTION | ORAL | 0 refills | Status: DC | PRN

## 2017-07-06 NOTE — ED Provider Notes (Signed)
[facility] EMERGENCY DEPARTMENT Provider Note   CSN: [id number] Arrival date & time: 07/06/17  1205     History   Chief Complaint Chief Complaint  Patient presents with  . Chest Pain  . Cough    HPI Patty [name] is a 52 y.o. female.  HPI Patient presents with chest pain and cough.  Right lateral chest pain.  States for the last 2 weeks she is been coughing.  Has had green sputum production.  States that initially she was having chills but that is cleared up.  States she has acute pain on her right ribs.  Worse with movement and worse with coughing.  History of asthma.  Has been using her inhalers at home.  Her sputum production has gone from dark green to light green.  No abdominal pain.  States she has had to use the inhalers and nebulizer at home more frequently than she is supposed to.  Has had the symptoms for around 2 weeks. Past Medical History:  Diagnosis Date  . Asthma   . DDD (degenerative disc disease), lumbar   . Depression   . HTN (hypertension)   . Hyperlipidemia   . Seizures (HCC)    remote  . Stroke [facility]) 1990   chronic right hemiparesis    Patient Active Problem List   Diagnosis Date Noted  . High risk medication use 04/25/2017  . Asthma exacerbation 10/18/2014  . Hyperglycemia 10/18/2014  . Acute respiratory failure with hypoxia (HCC) 10/18/2014  . Essential hypertension 10/18/2014  . Seizure disorder (HCC) 10/18/2014  . Acute cholecystitis 07/14/2014  . Cholelithiasis with acute cholecystitis 07/13/2014  . Cholecystitis with cholelithiasis 07/12/2014  . Rectal bleed 11/13/2011  . Left flank pain 11/13/2011    Past Surgical History:  Procedure Laterality Date  . CESAREAN SECTION    . CHOLECYSTECTOMY N/A 07/13/2014   Procedure: LAPAROSCOPIC CONVERTED TO OPEN  CHOLECYSTECTOMY ;  Surgeon: Darnell Level, MD;  Location: [facility];  Service: General;  Laterality: N/A;  . COLONOSCOPY  12/24/2011   Procedure: COLONOSCOPY;  Surgeon: West Bali, MD;   [facility];  Service: Endoscopy;  Laterality: N/A;  2:00  . GALLBLADDER SURGERY     [name] /[place]  . PARTIAL HYSTERECTOMY     heavy bleeding     OB History   None      Home Medications    Prior to Admission medications   Medication Sig Start Date End Date Taking? Authorizing Provider  aspirin EC 81 MG tablet Take 1 tablet (81 mg total) by mouth daily. 04/25/17  Yes Aliene Beams, MD  divalproex (DEPAKOTE) 250 MG DR tablet Take 250 mg by mouth 2 (two) times daily.    Yes [provider]  pravastatin (PRAVACHOL) 20 MG tablet Take 20 mg by mouth at bedtime.    Yes [provider]  SYMBICORT 80-4.5 MCG/ACT inhaler INHALE 2 PUFFS BY MOUTH EVERY 12 HOURS 05/26/17  Yes Hagler, Fleet Contras, MD  verapamil (CALAN) 120 MG tablet Take 1 tablet by mouth daily. 09/23/13  Yes [provider]  XOPENEX HFA 45 MCG/ACT inhaler INHALE 1 TO 2 PUFFS BY MOUTH EVERY 4 TO 6 HOURS AS NEEDED FOR WHEEZING 06/24/17  Yes Hagler, Fleet Contras, MD  doxycycline (VIBRAMYCIN) 100 MG capsule Take 1 capsule (100 mg total) by mouth 2 (two) times daily. 07/06/17   Benjiman Core, MD  guaiFENesin-dextromethorphan (ROBITUSSIN DM) 100-10 MG/5ML syrup Take 5 mLs by mouth every 4 (four) hours as needed for cough. 07/06/17  Benjiman CorePickering, Gao Mitnick, MD  predniSONE (DELTASONE) 20 MG tablet Take 2 tablets (40 mg total) by mouth daily. 07/06/17   Benjiman CorePickering, Ryan Palermo, MD    Family History Family History  Problem Relation Age of Onset  . Stroke Father   . Diabetes Other   . Colon cancer Neg Hx   . Liver disease Neg Hx     Social History Social History   Tobacco Use  . Smoking status: Never Smoker  . Smokeless tobacco: Never Used  Substance Use Topics  . Alcohol use: Yes    Alcohol/week: 1.2 oz    Types: 2 Standard drinks or equivalent per week    Comment: couple of drinks on weekends  . Drug use: No     Allergies   Patient has no known allergies.   Review of Systems Review of Systems    Constitutional: Positive for chills. Negative for appetite change.  HENT: Negative for congestion.   Respiratory: Positive for cough and shortness of breath.   Cardiovascular: Positive for chest pain.  Gastrointestinal: Negative for abdominal pain.  Genitourinary: Negative for dysuria.  Musculoskeletal: Negative for back pain.  Neurological: Negative for weakness.  Psychiatric/Behavioral: Negative for confusion.     Physical Exam Updated Vital Signs BP (!) 143/96   Pulse 66   Temp 98.1 F (36.7 C) (Oral)   Resp 14   Ht 5\' 2"  (1.575 m)   Wt 102.1 kg (225 lb)   SpO2 99%   BMI 41.15 kg/m   Physical Exam  Constitutional: She appears well-developed.  HENT:  Head: Normocephalic.  Neck: Neck supple.  Cardiovascular: Regular rhythm.  Pulmonary/Chest: Effort normal.  Somewhat harsh breath sounds, worse on the right side.  Tenderness on right lateral lower chest wall.  Abdominal: There is no tenderness.  Musculoskeletal:       Right lower leg: She exhibits no edema.       Left lower leg: She exhibits no edema.  Neurological: She is alert.  Skin: Skin is warm. Capillary refill takes less than 2 seconds.  Psychiatric: She has a normal mood and affect.     ED Treatments / Results  Labs (all labs ordered are listed, but only abnormal results are displayed) Labs Reviewed - No data to display  EKG EKG Interpretation  Date/Time:  Sunday July 06 2017 12:24:12 EDT Ventricular Rate:  82 PR Interval:    QRS Duration: 81 QT Interval:  385 QTC Calculation: 450 R Axis:   22 Text Interpretation:  Sinus rhythm Low voltage, precordial leads Borderline T abnormalities, diffuse leads No significant change since last tracing Confirmed by Benjiman CorePickering, Ski Polich [phone]([id number]) on 07/06/2017 12:52:57 PM   Radiology Dg Chest 2 View  Result Date: 07/06/2017 CLINICAL DATA:  Productive cough.  Rib pain. EXAM: CHEST - 2 VIEW COMPARISON:  10/18/2014 FINDINGS: The heart size and mediastinal contours are  within normal limits. Both lungs are clear. The visualized skeletal structures are unremarkable. IMPRESSION: No active cardiopulmonary disease. Electronically Signed   By: Gaylyn RongWalter  Liebkemann M.D.   On: 07/06/2017 13:04    Procedures Procedures (including critical care time)  Medications Ordered in ED Medications - No data to display   Initial Impression / Assessment and Plan / ED Course  I have reviewed the triage vital signs and the nursing notes.  Pertinent labs & imaging results that were available during my care of the patient were reviewed by me and considered in my medical decision making (see chart for details).     Patient presents  with shortness of breath and cough.  Been over the last couple weeks.  Also right rib pain.  Sounds wheezy.  With 2 weeks of sputum production will give antibiotics and steroids.  Not hypoxic here.  Will also give cough medicine.  Discharge home to follow-up with PCP as needed.  X-ray reviewed and reassuring.  Final Clinical Impressions(s) / ED Diagnoses   Final diagnoses:  Upper respiratory tract infection, unspecified type  Uncomplicated asthma, unspecified asthma severity, unspecified whether persistent    ED Discharge Orders        Ordered    doxycycline (VIBRAMYCIN) 100 MG capsule  2 times daily     07/06/17 1508    predniSONE (DELTASONE) 20 MG tablet  Daily     07/06/17 1508    guaiFENesin-dextromethorphan (ROBITUSSIN DM) 100-10 MG/5ML syrup  Every 4 hours PRN     07/06/17 1508       Benjiman Core, MD 07/06/17 1510

## 2017-07-06 NOTE — ED Triage Notes (Signed)
Pt states that she has been coughing up green phelgem for the past week her ribs are hurting from the cough

## 2017-07-18 ENCOUNTER — Other Ambulatory Visit: Payer: Self-pay | Admitting: Family Medicine

## 2017-07-23 ENCOUNTER — Encounter: Payer: Self-pay | Admitting: Family Medicine

## 2017-07-23 ENCOUNTER — Other Ambulatory Visit: Payer: Self-pay

## 2017-07-23 ENCOUNTER — Ambulatory Visit (INDEPENDENT_AMBULATORY_CARE_PROVIDER_SITE_OTHER): Payer: Medicare Other | Admitting: Family Medicine

## 2017-07-23 VITALS — BP 130/80 | HR 92 | Ht 62.0 in | Wt 216.0 lb

## 2017-07-23 DIAGNOSIS — E782 Mixed hyperlipidemia: Secondary | ICD-10-CM | POA: Diagnosis not present

## 2017-07-23 DIAGNOSIS — R7303 Prediabetes: Secondary | ICD-10-CM

## 2017-07-23 DIAGNOSIS — I10 Essential (primary) hypertension: Secondary | ICD-10-CM

## 2017-07-23 MED ORDER — VERAPAMIL HCL 120 MG PO TABS: 120.0000 mg | ORAL_TABLET | Freq: Every day | ORAL | 1 refills | Status: DC

## 2017-07-23 NOTE — Patient Instructions (Signed)
Come to the lab for your blood work 1-2 days before your visit.   Follow up in 3 months. Keep working on loosing weight Obesity, Adult Obesity is having too much body fat. If you have a BMI of 30 or more, you are obese. BMI is a number that explains how much body fat you have. Obesity is often caused by taking in (consuming) more calories than your body uses. Obesity can cause serious health problems. Changing your lifestyle can help to treat obesity. Follow these instructions at home: Eating and drinking   Follow advice from your doctor about what to eat and drink. Your doctor may tell you to: ? Cut down on (limit) fast foods, sweets, and processed snack foods. ? Choose low-fat options. For example, choose low-fat milk instead of whole milk. ? Eat 5 or more servings of fruits or vegetables every day. ? Eat at home more often. This gives you more control over what you eat. ? Choose healthy foods when you eat out. ? Learn what a healthy portion size is. A portion size is the amount of a certain food that is healthy for you to eat at one time. This is different for each person. ? Keep low-fat snacks available. ? Avoid sugary drinks. These include soda, fruit juice, iced tea that is sweetened with sugar, and flavored milk. ? Eat a healthy breakfast.  Drink enough water to keep your pee (urine) clear or pale yellow.  Do not go without eating for long periods of time (do not fast).  Do not go on popular or trendy diets (fad diets). Physical Activity  Exercise often, as told by your doctor. Ask your doctor: ? What types of exercise are safe for you. ? How often you should exercise.  Warm up and stretch before being active.  Do slow stretching after being active (cool down).  Rest between times of being active. Lifestyle  Limit how much time you spend in front of your TV, computer, or video game system (be less sedentary).  Find ways to reward yourself that do not involve  food.  Limit alcohol intake to no more than 1 drink a day for nonpregnant women and 2 drinks a day for men. One drink equals 12 oz of beer, 5 oz of wine, or 1 oz of hard liquor. General instructions  Keep a weight loss journal. This can help you keep track of: ? The food that you eat. ? The exercise that you do.  Take over-the-counter and prescription medicines only as told by your doctor.  Take vitamins and supplements only as told by your doctor.  Think about joining a support group. Your doctor may be able to help with this.  Keep all follow-up visits as told by your doctor. This is important. Contact a doctor if:  You cannot meet your weight loss goal after you have changed your diet and lifestyle for 6 weeks. This information is not intended to replace advice given to you by your health care provider. Make sure you discuss any questions you have with your health care provider. Document Released: 06/10/2011 Document Revised: 08/24/2015 Document Reviewed: 01/04/2015 Elsevier Interactive Patient Education  2018 [employer] osing weight,.

## 2017-07-23 NOTE — Progress Notes (Signed)
Patient ID: Patty [name], female    DOB: [birth year], 52 y.o.   MRN: [id number]  Chief Complaint  Patient presents with  . Essential Hypertension    follow up    Allergies Patient has no known allergies.  Subjective:   Patty [name] is a 52 y.o. female who presents to [facility] today.  HPI Here for a follow up to check BP and discuss labs. Is accompanied by her daughter. She reports that she is doing well and has not had any problems. Has been to see the cardiologist. Has had stress test/echo done which was within normal limits. Has a follow up scheduled with Dr. Purvis Sheffield.   BP has been running well. Taking medications qd. No CP. Feels like has had less SOB, DOE. No edema.   Has been working on weight loss. Energy is better. Has lost six pounds. Has been trying to cut down on sugar. Taking medications for asthma. No cough or SOB. No trouble with wheezing or allergies.    Past Medical History:  Diagnosis Date  . Asthma   . DDD (degenerative disc disease), lumbar   . Depression   . HTN (hypertension)   . Hyperlipidemia   . Seizures (HCC)    remote  . Stroke [facility]) 1990   chronic right hemiparesis    Past Surgical History:  Procedure Laterality Date  . CESAREAN SECTION    . CHOLECYSTECTOMY N/A 07/13/2014   Procedure: LAPAROSCOPIC CONVERTED TO OPEN  CHOLECYSTECTOMY ;  Surgeon: Darnell Level, MD;  Location: [facility];  Service: General;  Laterality: N/A;  . COLONOSCOPY  12/24/2011   Procedure: COLONOSCOPY;  Surgeon: West Bali, MD;  [facility];  Service: Endoscopy;  Laterality: N/A;  2:00  . GALLBLADDER SURGERY     [name] /[place]  . PARTIAL HYSTERECTOMY     heavy bleeding    Family History  Problem Relation Age of Onset  . Stroke Father   . Diabetes Other   . Colon cancer Neg Hx   . Liver disease Neg Hx      Social History   Socioeconomic History  . Marital status: Divorced    Spouse name: Not on file  . Number of children:  2  . Years of education: Not on file  . Highest education level: Not on file  Occupational History  . Occupation: disabled    Associate Professor: UNEMPLOYED  Social Needs  . Financial resource strain: Not on file  . Food insecurity:    Worry: Not on file    Inability: Not on file  . Transportation needs:    Medical: Not on file    Non-medical: Not on file  Tobacco Use  . Smoking status: Never Smoker  . Smokeless tobacco: Never Used  Substance and Sexual Activity  . Alcohol use: Yes    Alcohol/week: 1.2 oz    Types: 2 Standard drinks or equivalent per week    Comment: couple of drinks on weekends  . Drug use: No  . Sexual activity: Not Currently  Lifestyle  . Physical activity:    Days per week: Not on file    Minutes per session: Not on file  . Stress: Not on file  Relationships  . Social connections:    Talks on phone: Not on file    Gets together: Not on file    Attends religious service: Not on file    Active member of club or organization: Not on file  Attends meetings of clubs or organizations: Not on file    Relationship status: Not on file  Other Topics Concern  . Not on file  Social History Narrative   On disability. Used to work at [employer]/Works at [employer].   Divorced. 2 children.   Enjoys reading.    Current Outpatient Medications on File Prior to Visit  Medication Sig Dispense Refill  . aspirin EC 81 MG tablet Take 1 tablet (81 mg total) by mouth daily.    . divalproex (DEPAKOTE) 250 MG DR tablet Take 250 mg by mouth 2 (two) times daily.     . pravastatin (PRAVACHOL) 20 MG tablet Take 20 mg by mouth at bedtime.     . SYMBICORT 80-4.5 MCG/ACT inhaler INHALE 2 PUFFS BY MOUTH EVERY 12 HOURS 10.2 g 0  . XOPENEX HFA 45 MCG/ACT inhaler INHALE 1 TO 2 PUFFS BY MOUTH EVERY 4 TO 6 HOURS AS NEEDED FOR WHEEZING 15 g 0   No current facility-administered medications on file prior to visit.     Review of Systems  Constitutional: Negative for activity change,  appetite change and fever.  Eyes: Negative for visual disturbance.  Respiratory: Negative for cough, chest tightness and shortness of breath.   Cardiovascular: Negative for chest pain, palpitations and leg swelling.  Gastrointestinal: Negative for abdominal pain, nausea and vomiting.  Genitourinary: Negative for dysuria, frequency and urgency.  Skin: Negative for rash.  Neurological: Negative for dizziness, syncope and light-headedness.  Hematological: Negative for adenopathy.  Psychiatric/Behavioral: Negative for behavioral problems, decreased concentration, dysphoric mood, sleep disturbance and suicidal ideas.     Objective:   BP 130/80 (BP Location: Left Arm, Patient Position: Sitting, Cuff Size: Large)   Pulse 92   Ht 5\' 2"  (1.575 m)   Wt 216 lb 0.6 oz (98 kg)   BMI 39.51 kg/m   Physical Exam  Constitutional: She is oriented to person, place, and time. She appears well-developed and well-nourished. No distress.  HENT:  Head: Normocephalic and atraumatic.  Eyes: Pupils are equal, round, and reactive to light.  Neck: Normal range of motion. Neck supple. No thyromegaly present.  Cardiovascular: Normal rate, regular rhythm and normal heart sounds.  Pulmonary/Chest: Effort normal and breath sounds normal. No respiratory distress.  Neurological: She is alert and oriented to person, place, and time. No cranial nerve deficit.  Skin: Skin is warm and dry.  Nursing note and vitals reviewed.    Assessment and Plan  1. Prediabetes The patient is asked to make an attempt to improve diet and exercise patterns to aid in medical management of this problem. Patient counseled in detail regarding the risks of medication. Told to call or return to clinic if develop any worrisome signs or symptoms. Patient voiced understanding.  Defers starting metformin. Patient instructed to RTC in 3 mont and check labs prior to visit.  - Hemoglobin A1c  2. Essential hypertension Stable. Refill  medications.  - Lipid panel - verapamil (CALAN) 120 MG tablet; Take 1 tablet (120 mg total) by mouth daily.  Dispense: 90 tablet; Refill: 1  LDL goal again discussed and compliance with statin discussed. CVA risks discussed with patient.  Return in about 3 months (around 10/22/2017). Aliene Beamsachel Zaccheaus Storlie, MD 07/23/2017

## 2017-08-11 ENCOUNTER — Ambulatory Visit: Payer: Medicare Other | Admitting: Neurology

## 2017-08-14 ENCOUNTER — Ambulatory Visit: Payer: Medicare Other | Admitting: Cardiovascular Disease

## 2017-08-27 ENCOUNTER — Other Ambulatory Visit: Payer: Self-pay | Admitting: Family Medicine

## 2017-09-05 ENCOUNTER — Encounter: Payer: Self-pay | Admitting: Family Medicine

## 2017-09-10 ENCOUNTER — Ambulatory Visit: Payer: Medicare Other | Admitting: Cardiovascular Disease

## 2017-09-11 ENCOUNTER — Encounter: Payer: Self-pay | Admitting: Cardiovascular Disease

## 2017-09-16 ENCOUNTER — Other Ambulatory Visit: Payer: Self-pay | Admitting: Family Medicine

## 2017-10-12 ENCOUNTER — Other Ambulatory Visit: Payer: Self-pay | Admitting: Family Medicine

## 2017-10-29 ENCOUNTER — Ambulatory Visit: Payer: Medicare Other | Admitting: Family Medicine

## 2017-11-04 ENCOUNTER — Ambulatory Visit: Payer: Medicare Other | Admitting: Family Medicine

## 2017-11-07 ENCOUNTER — Ambulatory Visit (INDEPENDENT_AMBULATORY_CARE_PROVIDER_SITE_OTHER): Payer: Medicare Other | Admitting: Family Medicine

## 2017-11-07 ENCOUNTER — Encounter: Payer: Self-pay | Admitting: Family Medicine

## 2017-11-07 ENCOUNTER — Other Ambulatory Visit: Payer: Self-pay | Admitting: Family Medicine

## 2017-11-07 VITALS — BP 118/78 | HR 72 | Resp 16 | Ht 62.0 in | Wt 221.0 lb

## 2017-11-07 DIAGNOSIS — R7303 Prediabetes: Secondary | ICD-10-CM | POA: Diagnosis not present

## 2017-11-07 DIAGNOSIS — F320 Major depressive disorder, single episode, mild: Secondary | ICD-10-CM | POA: Diagnosis not present

## 2017-11-07 DIAGNOSIS — R5383 Other fatigue: Secondary | ICD-10-CM | POA: Diagnosis not present

## 2017-11-07 DIAGNOSIS — E782 Mixed hyperlipidemia: Secondary | ICD-10-CM

## 2017-11-07 DIAGNOSIS — I10 Essential (primary) hypertension: Secondary | ICD-10-CM | POA: Diagnosis not present

## 2017-11-07 MED ORDER — ESCITALOPRAM OXALATE 10 MG PO TABS: 10.0000 mg | ORAL_TABLET | Freq: Every day | ORAL | 1 refills | Status: DC

## 2017-11-07 NOTE — Progress Notes (Signed)
Patient ID: [name], female    DOB: 1965-11-02, 52 y.o.   MRN: [id number]  Chief Complaint  Patient presents with  . Fatigue  . Hypertension  . Hyperlipidemia  . Depression    Allergies Patient has no known allergies.  Subjective:   Patty [name] is a 52 y.o. female who presents to [facility] today.  HPI Patty [name] presents today for a follow-up visit.  She is accompanied by her daughter who was recently given birth to a baby girl.  Patient reports that she has not gotten her labs done.  She was coming today to follow-up on her blood work.  She also reports that she has been feeling tired and down.  She reports her mood is not been good.  Her daughter reports that she is irritable and tends to get very agitated and anxious.  She does feel sad and down from time to time.  She denies any suicidal or homicidal ideation.  She denies any auditory or visual hallucinations.  She would like to be on a medication for her mood.  She reports that she can get very upset and easily angered.  She does not cry per her report.  Reports her appetite has been good.  Reports she does not sleep well but tends to want to sleep more.  Has not been wanting to go out and walk and be active with her family and friends.  Reports that is just so hot during the day that she does not feel the energy to do things.  She reports that she does have air conditioning. Reports that blood pressure is running well.  Denies any chest pain, shortness of breath, swelling in her extremities.  Denies any headaches.  Reports her vision is fine.  Reports that her energy is lower than usual.   Past Medical History:  Diagnosis Date  . Asthma   . DDD (degenerative disc disease), lumbar   . Depression   . HTN (hypertension)   . Hyperlipidemia   . Seizures (HCC)    remote  . Stroke [facility]) 1990   chronic right hemiparesis    Past Surgical History:  Procedure Laterality Date  . CESAREAN SECTION    .  CHOLECYSTECTOMY N/A 07/13/2014   Procedure: LAPAROSCOPIC CONVERTED TO OPEN  CHOLECYSTECTOMY ;  Surgeon: Patty Level, MD;  Location: [facility];  Service: General;  Laterality: N/A;  . COLONOSCOPY  12/24/2011   Procedure: COLONOSCOPY;  Surgeon: West Bali, MD;  Location[facility];  Service: Endoscopy;  Laterality: N/A;  2:00  . GALLBLADDER SURGERY     Patty [name] /Patty [name]  . PARTIAL HYSTERECTOMY     heavy bleeding    Family History  Problem Relation Age of Onset  . Stroke Father   . Diabetes Other   . Colon cancer Neg Hx   . Liver disease Neg Hx      Social History   Socioeconomic History  . Marital status: Divorced    Spouse name: Not on file  . Number of children: 2  . Years of education: Not on file  . Highest education [name]: Not on file  Occupational History  . Occupation: disabled    Associate Professor: UNEMPLOYED  Social Needs  . Financial resource strain: Not on file  . Food insecurity:    Worry: Not on file    Inability: Not on file  . Transportation needs:    Medical: Not on file    Non-medical: Not  on file  Tobacco Use  . Smoking status: Never Smoker  . Smokeless tobacco: Never Used  Substance and Sexual Activity  . Alcohol use: Yes    Alcohol/week: 2.0 standard drinks    Types: 2 Standard drinks or equivalent per week    Comment: couple of drinks on weekends  . Drug use: No  . Sexual activity: Not Currently  Lifestyle  . Physical activity:    Days per week: Not on file    Minutes per session: Not on file  . Stress: Not on file  Relationships  . Social connections:    Talks on phone: Not on file    Gets together: Not on file    Attends religious service: Not on file    Active member of club or organization: Not on file    Attends meetings of clubs or organizations: Not on file    Relationship status: Not on file  Other Topics Concern  . Not on file  Social History Narrative   On disability. Used to work at [employer]/Worked at [employer].    Divorced. 2 children. Has seven grand children.    Enjoys reading.    Current Outpatient Medications on File Prior to Visit  Medication Sig Dispense Refill  . aspirin EC 81 MG tablet Take 1 tablet (81 mg total) by mouth daily.    . divalproex (DEPAKOTE) 250 MG DR tablet TAKE 1 TABLET BY MOUTH THREE TIMES DAILY 270 tablet 0  . pravastatin (PRAVACHOL) 20 MG tablet Take 20 mg by mouth at bedtime.     . SYMBICORT 80-4.5 MCG/ACT inhaler INHALE 2 PUFFS BY MOUTH EVERY 12 HOURS 10.2 g 0  . verapamil (CALAN) 120 MG tablet Take 1 tablet (120 mg total) by mouth daily. 90 tablet 1  . XOPENEX HFA 45 MCG/ACT inhaler INHALE 1 TO 2 PUFFS BY MOUTH EVERY 4 TO 6 HOURS AS NEEDED FOR WHEEZING 15 g 0   No current facility-administered medications on file prior to visit.     Review of Systems  Constitutional: Positive for fatigue and unexpected weight change. Negative for activity change, appetite change and fever.       Patient reports that she is surprised that she has gained 5 pounds.  She reports she is cut out sodas.  She does still tend to eat a lot of fast food and junk food per her and her daughter's report.  HENT: Negative for trouble swallowing and voice change.   Eyes: Negative for visual disturbance.  Respiratory: Negative for cough, chest tightness and shortness of breath.   Cardiovascular: Negative for chest pain, palpitations and leg swelling.  Gastrointestinal: Negative for abdominal pain, nausea and vomiting.  Genitourinary: Negative for dysuria, frequency and urgency.  Skin: Negative for rash.  Neurological: Negative for dizziness, tremors, seizures, syncope, weakness, light-headedness, numbness and headaches.  Hematological: Negative for adenopathy.  Psychiatric/Behavioral: Positive for agitation, decreased concentration and sleep disturbance. Negative for confusion, dysphoric mood, hallucinations, self-injury and suicidal ideas. The patient is nervous/anxious.      Objective:   BP  118/78   Pulse 72   Resp 16   Ht 5\' 2"  (1.575 m)   Wt 221 lb (100.2 kg)   SpO2 95%   BMI 40.42 kg/m   Physical Exam  Constitutional: She is oriented to person, place, and time. She appears well-developed and well-nourished. No distress.  HENT:  Head: Normocephalic and atraumatic.  Eyes: Pupils are equal, round, and reactive to light.  Neck: Normal range  of motion. Neck supple. No thyromegaly present.  Cardiovascular: Normal rate, regular rhythm and normal heart sounds.  Pulmonary/Chest: Effort normal and breath sounds normal. No respiratory distress.  Neurological: She is alert and oriented to person, place, and time. No cranial nerve deficit.  Skin: Skin is warm and dry.  Nursing note and vitals reviewed.   [facility] 11/07/2017 07/23/2017 04/25/2017  Decreased Interest 1 1 0  Down, Depressed, Hopeless 1 0 0  PHQ - 2 Score 2 1 0  Altered sleeping 3 - -  Tired, decreased energy 3 - -  Change in appetite 3 - -  Feeling bad or failure about yourself  0 - -  Trouble concentrating 0 - -  Moving slowly or fidgety/restless 3 - -  Suicidal thoughts 0 - -  PHQ-9 Score 14 - -  Difficult doing work/chores Very difficult - -    Assessment and Plan  1. Essential hypertension Stable.  Lifestyle modifications discussed with patient including a diet emphasizing vegetables, fruits, and whole grains. Limiting intake of sodium to less than 2,400 mg per day.  Recommendations discussed include consuming low-fat dairy products, poultry, fish, legumes, non-tropical vegetable oils, and nuts; and limiting intake of sweets, sugar-sweetened beverages, and red meat. Discussed following a plan such as the Dietary Approaches to Stop Hypertension (DASH) diet. Patient to read up on this diet.  Patient counseled in detail regarding the risks of medication. Told to call or return to clinic if develop any worrisome signs or symptoms. Patient voiced understanding.   - COMPLETE METABOLIC PANEL  WITH GFR  2. Prediabetes The patient is asked to make an attempt to improve diet and exercise patterns to aid in medical management of this problem. Today we discussed the possibility of diabetes/prediabetes.  Check A1c. - Hemoglobin A1c  3. Depression, major, single episode, mild (HCC) Trial of Lexapro 10 mg 1 p.o. daily.  Risk versus benefits discussed with patient and daughter.  Possible side effects of medication discussed. Suicide risks evaluated and documented in note if present or in the area below.  Patient does not have/denies the following risks: previous suicide attempts, family history of suicide, access to lethal means, prior history of psychiatric disorder, history of alcohol or substance abuse disorder, recent loss of a loved one, or severe hopelessness. Patient denies access to firearms or if present will have removed from home/access.   Patient has protective factors of family and community support.  Patient reports that family believes is behaving rationally. Patient displays problem solving skills.   Patient specifically denies suicide ideation. Patient has access/information to healthcare contacts if situation or mood changes where patient is a risk to self or others or mood becomes unstable.   During the encounter, the patient had good eye contact and firm handshake regarding safety contract and agreement to seek help if mood worsens and not to harm self.   Patient understands the treatment plan and is in agreement. Agrees to keep follow up and call prior or return to clinic if needed.   - escitalopram (LEXAPRO) 10 MG tablet; Take 1 tablet (10 mg total) by mouth daily.  Dispense: 30 tablet; Refill: 1  4. Fatigue, unspecified type Possibly secondary to mood disorder.  Check labs. - CBC with Differential/Platelet - TSH  5. Mixed hyperlipidemia Check labs.  Patient does have risk factors and LDL does need to be less than 70 secondary to her history of stroke. - Lipid  panel Patient was told to call with any  questions or concerns.  Keep her scheduled follow-up in 1 month.  Defers therapy at this time.  Relaxation, exercise, and dietary changes discussed.  Call with any problems or side effects with medication.  Medications reviewed with patient and daughter. Return in about 4 weeks (around 12/05/2017) for Follow-up mood. Aliene Beams, MD 11/07/2017

## 2017-11-10 DIAGNOSIS — R5383 Other fatigue: Secondary | ICD-10-CM | POA: Diagnosis not present

## 2017-11-10 DIAGNOSIS — E782 Mixed hyperlipidemia: Secondary | ICD-10-CM | POA: Diagnosis not present

## 2017-11-10 DIAGNOSIS — R7303 Prediabetes: Secondary | ICD-10-CM | POA: Diagnosis not present

## 2017-11-10 DIAGNOSIS — I10 Essential (primary) hypertension: Secondary | ICD-10-CM | POA: Diagnosis not present

## 2017-11-11 ENCOUNTER — Encounter: Payer: Self-pay | Admitting: Family Medicine

## 2017-11-11 LAB — CBC WITH DIFFERENTIAL/PLATELET
Basophils Absolute: 63 cells/uL (ref 0–200)
Basophils Relative: 0.8 %
EOS ABS: 126 {cells}/uL (ref 15–500)
Eosinophils Relative: 1.6 %
HEMATOCRIT: 39.1 % (ref 35.0–45.0)
HEMOGLOBIN: 13.4 g/dL (ref 11.7–15.5)
LYMPHS ABS: 3326 {cells}/uL (ref 850–3900)
MCH: 30.4 pg (ref 27.0–33.0)
MCHC: 34.3 g/dL (ref 32.0–36.0)
MCV: 88.7 fL (ref 80.0–100.0)
MPV: 11.9 fL (ref 7.5–12.5)
Monocytes Relative: 7.4 %
NEUTROS ABS: 3800 {cells}/uL (ref 1500–7800)
Neutrophils Relative %: 48.1 %
PLATELETS: 251 10*3/uL (ref 140–400)
RBC: 4.41 10*6/uL (ref 3.80–5.10)
RDW: 13 % (ref 11.0–15.0)
TOTAL LYMPHOCYTE: 42.1 %
WBC mixed population: 585 cells/uL (ref 200–950)
WBC: 7.9 10*3/uL (ref 3.8–10.8)

## 2017-11-11 LAB — LIPID PANEL
CHOL/HDL RATIO: 3.6 (calc) (ref <5.0)
CHOLESTEROL: 183 mg/dL (ref <200)
HDL: 51 mg/dL (ref >50)
LDL CHOLESTEROL (CALC): 110 mg/dL — AB
Non-HDL Cholesterol (Calc): 132 mg/dL (calc) — ABNORMAL HIGH (ref <130)
TRIGLYCERIDES: 113 mg/dL (ref <150)

## 2017-11-11 LAB — COMPLETE METABOLIC PANEL WITH GFR
AG RATIO: 1.3 (calc) (ref 1.0–2.5)
ALBUMIN MSPROF: 4.1 g/dL (ref 3.6–5.1)
ALT: 10 U/L (ref 6–29)
AST: 10 U/L (ref 10–35)
Alkaline phosphatase (APISO): 81 U/L (ref 33–130)
BILIRUBIN TOTAL: 0.5 mg/dL (ref 0.2–1.2)
BUN: 21 mg/dL (ref 7–25)
CALCIUM: 9.7 mg/dL (ref 8.6–10.4)
CO2: 26 mmol/L (ref 20–32)
Chloride: 106 mmol/L (ref 98–110)
Creat: 0.8 mg/dL (ref 0.50–1.05)
GFR, EST AFRICAN AMERICAN: 98 mL/min/{1.73_m2} (ref >=60)
GFR, EST NON AFRICAN AMERICAN: 84 mL/min/{1.73_m2} (ref >=60)
Globulin: 3.2 g/dL (calc) (ref 1.9–3.7)
Glucose, Bld: 89 mg/dL (ref 65–99)
POTASSIUM: 4.3 mmol/L (ref 3.5–5.3)
Sodium: 141 mmol/L (ref 135–146)
TOTAL PROTEIN: 7.3 g/dL (ref 6.1–8.1)

## 2017-11-11 LAB — HEMOGLOBIN A1C
Hgb A1c MFr Bld: 5.7 % of total Hgb — ABNORMAL HIGH (ref <5.7)
Mean Plasma Glucose: 117 (calc)
eAG (mmol/L): 6.5 (calc)

## 2017-11-11 LAB — TSH: TSH: 2.26 m[IU]/L

## 2017-11-15 ENCOUNTER — Other Ambulatory Visit: Payer: Self-pay | Admitting: Family Medicine

## 2017-11-25 ENCOUNTER — Other Ambulatory Visit: Payer: Self-pay | Admitting: Family Medicine

## 2017-12-08 ENCOUNTER — Other Ambulatory Visit: Payer: Self-pay | Admitting: Family Medicine

## 2017-12-08 ENCOUNTER — Ambulatory Visit (HOSPITAL_COMMUNITY): Payer: Medicare Other

## 2017-12-08 DIAGNOSIS — Z1231 Encounter for screening mammogram for malignant neoplasm of breast: Secondary | ICD-10-CM

## 2017-12-09 ENCOUNTER — Encounter: Payer: Self-pay | Admitting: Family Medicine

## 2017-12-09 ENCOUNTER — Other Ambulatory Visit: Payer: Self-pay

## 2017-12-09 ENCOUNTER — Ambulatory Visit (INDEPENDENT_AMBULATORY_CARE_PROVIDER_SITE_OTHER): Payer: Medicare Other | Admitting: Family Medicine

## 2017-12-09 DIAGNOSIS — I69359 Hemiplegia and hemiparesis following cerebral infarction affecting unspecified side: Secondary | ICD-10-CM

## 2017-12-09 NOTE — Progress Notes (Signed)
Patient ID: Patty [name], female    DOB: 07/03/1965, 52 y.o.   MRN: [id number]  Chief Complaint  Patient presents with  . Hypertension    follow up    Allergies Patient has no known allergies.  Subjective:   Patty [name] is a 52 y.o. female who presents to [facility] today.  HPI Patient left without being seen. OV cancelled.    Past Medical History:  Diagnosis Date  . Asthma   . DDD (degenerative disc disease), lumbar   . Depression   . HTN (hypertension)   . Hyperlipidemia   . Seizures (HCC)    remote  . Stroke [facility]) 1990   chronic right hemiparesis    Past Surgical History:  Procedure Laterality Date  . CESAREAN SECTION    . CHOLECYSTECTOMY N/A 07/13/2014   Procedure: LAPAROSCOPIC CONVERTED TO OPEN  CHOLECYSTECTOMY ;  Surgeon: Darnell Level, MD;  Location: [facility];  Service: General;  Laterality: N/A;  . COLONOSCOPY  12/24/2011   Procedure: COLONOSCOPY;  Surgeon: West Bali, MD;  [facility];  Service: Endoscopy;  Laterality: N/A;  2:00  . GALLBLADDER SURGERY     [name] /[place]  . PARTIAL HYSTERECTOMY     heavy bleeding    Family History  Problem Relation Age of Onset  . Stroke Father   . Diabetes Other   . Colon cancer Neg Hx   . Liver disease Neg Hx      Social History   Socioeconomic History  . Marital status: Divorced    Spouse name: Not on file  . Number of children: 2  . Years of education: Not on file  . Highest education level: Not on file  Occupational History  . Occupation: disabled    Associate Professor: UNEMPLOYED  Social Needs  . Financial resource strain: Not on file  . Food insecurity:    Worry: Not on file    Inability: Not on file  . Transportation needs:    Medical: Not on file    Non-medical: Not on file  Tobacco Use  . Smoking status: Never Smoker  . Smokeless tobacco: Never Used  Substance and Sexual Activity  . Alcohol use: Yes    Alcohol/week: 2.0 standard drinks    Types: 2 Standard  drinks or equivalent per week    Comment: couple of drinks on weekends  . Drug use: No  . Sexual activity: Not Currently  Lifestyle  . Physical activity:    Days per week: Not on file    Minutes per session: Not on file  . Stress: Not on file  Relationships  . Social connections:    Talks on phone: Not on file    Gets together: Not on file    Attends religious service: Not on file    Active member of club or organization: Not on file    Attends meetings of clubs or organizations: Not on file    Relationship status: Not on file  Other Topics Concern  . Not on file  Social History Narrative   On disability. Used to work at [employer]/Worked at [employer].   Divorced. 2 children. Has seven grand children.    Enjoys reading.     Review of Systems   Objective:   BP 126/84 (BP Location: Left Arm, Patient Position: Sitting, Cuff Size: Large)   Pulse 78   Temp 98.2 F (36.8 C) (Temporal)   Resp 12   Ht 5\' 2"  (  1.575 m)   Wt 225 lb 1.3 oz (102.1 kg)   SpO2 96% Comment: room air  BMI 41.17 kg/m   Physical Exam   Assessment and Plan   There are no diagnoses linked to this encounter.   No follow-ups on file. Edwena Bunde, CMA 12/09/2017

## 2017-12-10 ENCOUNTER — Ambulatory Visit (HOSPITAL_COMMUNITY)
Admission: RE | Admit: 2017-12-10 | Discharge: 2017-12-10 | Disposition: A | Discharge status: Home / Self Care | Payer: Medicare Other | Source: Ambulatory Visit | Attending: Family Medicine | Admitting: Family Medicine

## 2017-12-10 ENCOUNTER — Encounter: Payer: Self-pay | Admitting: Family Medicine

## 2017-12-10 ENCOUNTER — Telehealth: Payer: Self-pay | Admitting: Family Medicine

## 2017-12-10 ENCOUNTER — Ambulatory Visit (INDEPENDENT_AMBULATORY_CARE_PROVIDER_SITE_OTHER): Payer: Medicare Other | Admitting: Family Medicine

## 2017-12-10 VITALS — BP 126/84 | HR 78 | Temp 98.1°F | Resp 12 | Ht 62.0 in | Wt 225.1 lb

## 2017-12-10 DIAGNOSIS — J4521 Mild intermittent asthma with (acute) exacerbation: Secondary | ICD-10-CM | POA: Diagnosis not present

## 2017-12-10 DIAGNOSIS — Z1231 Encounter for screening mammogram for malignant neoplasm of breast: Secondary | ICD-10-CM | POA: Diagnosis not present

## 2017-12-10 DIAGNOSIS — B9789 Other viral agents as the cause of diseases classified elsewhere: Secondary | ICD-10-CM

## 2017-12-10 DIAGNOSIS — J069 Acute upper respiratory infection, unspecified: Secondary | ICD-10-CM

## 2017-12-10 DIAGNOSIS — R05 Cough: Secondary | ICD-10-CM | POA: Diagnosis not present

## 2017-12-10 MED ORDER — ALBUTEROL SULFATE (2.5 MG/3ML) 0.083% IN NEBU
2.5000 mg | INHALATION_SOLUTION | Freq: Once | RESPIRATORY_TRACT | Status: AC
  Administered 2017-12-10: 2.5 mg via RESPIRATORY_TRACT

## 2017-12-10 MED ORDER — PREDNISONE 20 MG PO TABS: ORAL_TABLET | ORAL | 0 refills | Status: DC

## 2017-12-10 NOTE — Telephone Encounter (Signed)
Patient dropped off paperwork to be filled out

## 2017-12-10 NOTE — Progress Notes (Signed)
Patient ID: Patty [name], female    DOB: 11-Jun-1965, 52 y.o.   MRN: [id number]  Chief Complaint  Patient presents with  . Cough    follow up    Allergies Patient has no known allergies.  Subjective:   Patty [name] is a 52 y.o. female who presents to [facility] today.  HPI Patty [name] presents today with a complaint of cough for the past 4 days.  She reports that her cough has been productive of clear sputum.  She reports that she has also had wheezing associated with the cough.  She has had runny nose and intermittent nasal congestion.  She reports she feels like she is wheezing.  She does have a history of asthma and uses her inhaler occasionally.  She does not have to use her inhaler on a daily basis.  She reports that she has never had pneumonia.  She does report that she has been using her Symbicort twice a day as directed.  She reports she has been using her Xopenex via nebulizer machine several times a day.  She reports that her cough is improved after the nebulizer treatment.  She reports that she does feel more winded with this cold that she has.  She has not had any known sick contacts.  She denies any fevers, chills, nausea, vomiting, or abdominal pain.  She reports that she has been taking all of her other medications.  She does not have any night sweats.  She has no exposure history that could have exacerbated her asthma.  She reports that leading up to this flare she is not sure if she was using her Symbicort on a daily basis. Cough  This is a new problem. The current episode started in the past 7 days. The problem has been waxing and waning. The problem occurs every few minutes. The cough is productive of sputum. Associated symptoms include nasal congestion, postnasal drip, rhinorrhea and wheezing. Pertinent negatives include no chest pain, chills, ear congestion, ear pain, fever, headaches, heartburn, hemoptysis, myalgias, rash, sore throat, sweats or weight loss.  Nothing aggravates the symptoms. She has tried a beta-agonist inhaler and rest for the symptoms. The treatment provided mild relief. Her past medical history is significant for asthma, bronchitis and environmental allergies. There is no history of COPD, emphysema or pneumonia.    Past Medical History:  Diagnosis Date  . Asthma   . DDD (degenerative disc disease), lumbar   . Depression   . HTN (hypertension)   . Hyperlipidemia   . Seizures (HCC)    remote  . Stroke [facility]) 1990   chronic right hemiparesis    Past Surgical History:  Procedure Laterality Date  . CESAREAN SECTION    . CHOLECYSTECTOMY N/A 07/13/2014   Procedure: LAPAROSCOPIC CONVERTED TO OPEN  CHOLECYSTECTOMY ;  Surgeon: Darnell Level, MD;  Location: [facility];  Service: General;  Laterality: N/A;  . COLONOSCOPY  12/24/2011   Procedure: COLONOSCOPY;  Surgeon: West Bali, MD;  Location[facility];  Service: Endoscopy;  Laterality: N/A;  2:00  . GALLBLADDER SURGERY     [name] /[place]  . PARTIAL HYSTERECTOMY     heavy bleeding    Family History  Problem Relation Age of Onset  . Stroke Father   . Diabetes Other   . Colon cancer Neg Hx   . Liver disease Neg Hx      Social History   Socioeconomic History  . Marital status: Divorced  Spouse name: Not on file  . Number of children: 2  . Years of education: Not on file  . Highest education level: Not on file  Occupational History  . Occupation: disabled    Associate Professor: UNEMPLOYED  Social Needs  . Financial resource strain: Not on file  . Food insecurity:    Worry: Not on file    Inability: Not on file  . Transportation needs:    Medical: Not on file    Non-medical: Not on file  Tobacco Use  . Smoking status: Never Smoker  . Smokeless tobacco: Never Used  Substance and Sexual Activity  . Alcohol use: Yes    Alcohol/week: 2.0 standard drinks    Types: 2 Standard drinks or equivalent per week    Comment: couple of drinks on weekends  . Drug use:  No  . Sexual activity: Not Currently  Lifestyle  . Physical activity:    Days per week: Not on file    Minutes per session: Not on file  . Stress: Not on file  Relationships  . Social connections:    Talks on phone: Not on file    Gets together: Not on file    Attends religious service: Not on file    Active member of club or organization: Not on file    Attends meetings of clubs or organizations: Not on file    Relationship status: Not on file  Other Topics Concern  . Not on file  Social History Narrative   On disability. Used to work at [employer]/Worked at [employer].   Divorced. 2 children. Has seven grand children.    Enjoys reading.    Current Outpatient Medications on File Prior to Visit  Medication Sig Dispense Refill  . aspirin EC 81 MG tablet Take 1 tablet (81 mg total) by mouth daily.    . divalproex (DEPAKOTE) 250 MG DR tablet TAKE 1 TABLET BY MOUTH THREE TIMES DAILY 270 tablet 0  . escitalopram (LEXAPRO) 10 MG tablet TAKE 1 TABLET(10 MG) BY MOUTH DAILY 90 tablet 1  . pravastatin (PRAVACHOL) 20 MG tablet TAKE 1 TABLET BY MOUTH EVERY DAY 30 tablet 0  . SYMBICORT 80-4.5 MCG/ACT inhaler INHALE 2 PUFFS BY MOUTH EVERY 12 HOURS 10.2 g 0  . verapamil (CALAN) 120 MG tablet Take 1 tablet (120 mg total) by mouth daily. 90 tablet 1  . XOPENEX HFA 45 MCG/ACT inhaler INHALE 1 TO 2 PUFFS BY MOUTH EVERY 4 TO 6 HOURS AS NEEDED FOR WHEEZING 15 g 0   No current facility-administered medications on file prior to visit.     Review of Systems  Constitutional: Positive for fatigue. Negative for activity change, appetite change, chills, fever and weight loss.  HENT: Positive for postnasal drip and rhinorrhea. Negative for ear pain and sore throat.   Respiratory: Positive for cough and wheezing. Negative for hemoptysis.        She reports that now that she is had a flareup of her asthma that she does get more winded with exertion.  She reports she has had lower energy than normal.   Denies any hemoptysis.  Cardiovascular: Negative for chest pain, palpitations and leg swelling.  Gastrointestinal: Negative for abdominal pain, heartburn, nausea and vomiting.  Genitourinary: Negative for frequency.  Musculoskeletal: Negative for myalgias.  Skin: Negative for rash.  Allergic/Immunologic: Positive for environmental allergies.  Neurological: Negative for dizziness, syncope, facial asymmetry and headaches.  Psychiatric/Behavioral: Negative for dysphoric mood.     Objective:  BP 126/84 (BP Location: Left Arm, Patient Position: Sitting, Cuff Size: Large)   Pulse 78   Temp 98.1 F (36.7 C) (Temporal)   Resp 12   Ht 5\' 2"  (1.575 m)   Wt 225 lb 1.9 oz (102.1 kg)   SpO2 95% Comment: room air  BMI 41.17 kg/m   Physical Exam  Constitutional: She appears well-developed and well-nourished.  HENT:  Head: Normocephalic and atraumatic.  Right Ear: External ear normal.  Left Ear: External ear normal.  Mouth/Throat: Oropharynx is clear and moist.  Eyes: Pupils are equal, round, and reactive to light. Conjunctivae and EOM are normal.  Neck: Normal range of motion. Neck supple. No JVD present. No tracheal deviation present. No thyromegaly present.  Pulmonary/Chest: Effort normal. She has wheezes. She has rhonchi in the left upper field and the left middle field.  Patient has scattered wheezing throughout her lung fields.  She does have rhonchi present in her left lung fields.  Abdominal: Soft.  Lymphadenopathy:    She has no cervical adenopathy.   Patient was given albuterol nebulizer treatment in the office.  She reports that she felt better after the nebulizer treatment.  She did have improvement in breath sounds and increased wheezing audible with the nebulizer therapy. Depression screen [facility] 2/9 12/10/2017 12/09/2017 11/07/2017 07/23/2017 04/25/2017  Decreased Interest 3 2 1 1  0  Down, Depressed, Hopeless 2 1 1  0 0  PHQ - 2 Score 5 3 2 1  0  Altered sleeping 3 3 3  - -  Tired,  decreased energy 3 3 3  - -  Change in appetite 1 0 3 - -  Feeling bad or failure about yourself  0 1 0 - -  Trouble concentrating 1 1 0 - -  Moving slowly or fidgety/restless 3 2 3  - -  Suicidal thoughts 0 0 0 - -  PHQ-9 Score 16 13 14  - -  Difficult doing work/chores Somewhat difficult - Very difficult - -    Assessment and Plan  1. Mild intermittent asthma with exacerbation Discussed with patient that she most likely has a viral upper respiratory infection which has exacerbated her asthma.  She will continue her Symbicort as directed.  We will start prednisone 40 mg p.o. daily for 5 days. We will send for chest x-ray at this time. Suspect symptoms are viral in etiology.  Do not see need for antibiotics at this time.  However, patient was counseled that if her symptoms worsen or if she develops any shortness of breath, difficulty breathing, fevers, vomiting, or any other worrisome signs and symptoms she needs to call our office or go to the emergency department.  She did voice understanding.  Will call patient after received results of chest x-ray.  She was going to get this done after her visit today. Supportive care discussed with patient. She was told to call with any questions or concerns. Continue nebulizer therapy every 4-6 hours as needed.   - predniSONE (DELTASONE) 20 MG tablet; Take two pills each day for five days  Dispense: 10 tablet; Refill: 0 - DG Chest 2 View; Future  Return if symptoms worsen or fail to improve. Aliene Beams, MD 12/10/2017

## 2017-12-10 NOTE — Addendum Note (Signed)
Addended by: Galen Daft A on: 12/10/2017 01:24 PM   Modules accepted: Orders

## 2017-12-11 ENCOUNTER — Telehealth: Payer: Self-pay

## 2017-12-11 MED ORDER — AZITHROMYCIN 250 MG PO TABS: ORAL_TABLET | ORAL | 0 refills | Status: DC

## 2017-12-11 NOTE — Telephone Encounter (Signed)
Zithromax 250mg  2 tablets day 1 and 1 tablet each day x 4 days called in for patient per Dr.Hagler

## 2017-12-13 ENCOUNTER — Other Ambulatory Visit: Payer: Self-pay | Admitting: Family Medicine

## 2017-12-25 ENCOUNTER — Other Ambulatory Visit: Payer: Self-pay | Admitting: Family Medicine

## 2017-12-30 DIAGNOSIS — J45909 Unspecified asthma, uncomplicated: Secondary | ICD-10-CM | POA: Diagnosis not present

## 2017-12-30 DIAGNOSIS — E7800 Pure hypercholesterolemia, unspecified: Secondary | ICD-10-CM | POA: Diagnosis not present

## 2017-12-30 DIAGNOSIS — F419 Anxiety disorder, unspecified: Secondary | ICD-10-CM | POA: Diagnosis not present

## 2017-12-30 DIAGNOSIS — I10 Essential (primary) hypertension: Secondary | ICD-10-CM | POA: Diagnosis not present

## 2018-01-26 ENCOUNTER — Other Ambulatory Visit: Payer: Self-pay | Admitting: Family Medicine

## 2018-01-26 DIAGNOSIS — I10 Essential (primary) hypertension: Secondary | ICD-10-CM

## 2018-02-11 ENCOUNTER — Other Ambulatory Visit: Payer: Self-pay | Admitting: Family Medicine

## 2018-02-11 DIAGNOSIS — I10 Essential (primary) hypertension: Secondary | ICD-10-CM

## 2018-03-13 ENCOUNTER — Other Ambulatory Visit: Payer: Self-pay | Admitting: Family Medicine

## 2018-04-09 DIAGNOSIS — I10 Essential (primary) hypertension: Secondary | ICD-10-CM | POA: Diagnosis not present

## 2018-04-09 DIAGNOSIS — F321 Major depressive disorder, single episode, moderate: Secondary | ICD-10-CM | POA: Diagnosis not present

## 2018-04-09 DIAGNOSIS — J45909 Unspecified asthma, uncomplicated: Secondary | ICD-10-CM | POA: Diagnosis not present

## 2018-07-07 DIAGNOSIS — M25561 Pain in right knee: Secondary | ICD-10-CM | POA: Diagnosis not present

## 2018-07-07 DIAGNOSIS — R32 Unspecified urinary incontinence: Secondary | ICD-10-CM | POA: Diagnosis not present

## 2018-07-07 DIAGNOSIS — I10 Essential (primary) hypertension: Secondary | ICD-10-CM | POA: Diagnosis not present

## 2018-07-30 DIAGNOSIS — M25561 Pain in right knee: Secondary | ICD-10-CM | POA: Diagnosis not present

## 2018-07-30 DIAGNOSIS — G8190 Hemiplegia, unspecified affecting unspecified side: Secondary | ICD-10-CM | POA: Diagnosis not present

## 2018-08-07 DIAGNOSIS — M25461 Effusion, right knee: Secondary | ICD-10-CM | POA: Diagnosis not present

## 2018-08-07 DIAGNOSIS — I10 Essential (primary) hypertension: Secondary | ICD-10-CM | POA: Diagnosis not present

## 2018-08-07 DIAGNOSIS — G8190 Hemiplegia, unspecified affecting unspecified side: Secondary | ICD-10-CM | POA: Diagnosis not present

## 2018-09-08 ENCOUNTER — Encounter: Payer: Self-pay | Admitting: Family Medicine

## 2018-09-08 ENCOUNTER — Ambulatory Visit (INDEPENDENT_AMBULATORY_CARE_PROVIDER_SITE_OTHER): Payer: Medicare Other | Admitting: Family Medicine

## 2018-09-08 ENCOUNTER — Other Ambulatory Visit: Payer: Self-pay

## 2018-09-08 ENCOUNTER — Encounter (INDEPENDENT_AMBULATORY_CARE_PROVIDER_SITE_OTHER): Payer: Self-pay

## 2018-09-08 VITALS — BP 116/80 | HR 78 | Temp 98.6°F | Resp 12 | Ht 62.0 in | Wt 222.0 lb

## 2018-09-08 DIAGNOSIS — E559 Vitamin D deficiency, unspecified: Secondary | ICD-10-CM | POA: Diagnosis not present

## 2018-09-08 DIAGNOSIS — Z1159 Encounter for screening for other viral diseases: Secondary | ICD-10-CM | POA: Diagnosis not present

## 2018-09-08 DIAGNOSIS — E782 Mixed hyperlipidemia: Secondary | ICD-10-CM | POA: Diagnosis not present

## 2018-09-08 DIAGNOSIS — G40909 Epilepsy, unspecified, not intractable, without status epilepticus: Secondary | ICD-10-CM

## 2018-09-08 DIAGNOSIS — I69359 Hemiplegia and hemiparesis following cerebral infarction affecting unspecified side: Secondary | ICD-10-CM

## 2018-09-08 DIAGNOSIS — I10 Essential (primary) hypertension: Secondary | ICD-10-CM

## 2018-09-08 DIAGNOSIS — R7303 Prediabetes: Secondary | ICD-10-CM | POA: Diagnosis not present

## 2018-09-08 NOTE — Progress Notes (Addendum)
Subjective:     Patient ID: Patty [name], female   DOB: 14-Jun-1965, 53 y.o.   MRN: [id number]  Patty [name] presents for New Patient (Initial Visit) (establish care) and Knee Pain (swells on right side, same side she had stroke on)   Patty [name] is a 53 year old female patient who presents today for establishment of care.  Originally she was a patient of Dr. Roma Kayser and she left and was currently until now being seen by Dr. Berdine Addison, unsure of which practice.  Reports that she did not feel like she was getting good care and needed to come back to be seen by [place].   She reports taking all her current medications as directed.  Denies having any signs of symptoms of elevated BP, asthma or seizure activity.  All of which she has a history of among others.  She is going to be following up with orthopedics secondary to her right knee giving her some pain which is on the side that she suffered a residual weakness from a stroke many years ago in 1990.  She has right-sided arm weakness and leg weakness.  She has somebody who stays with her as an aide that helps her with her care.  She does require some care as it is hard for her to raise her arm and do certain activities of daily living.  She reports she has not had her labs drawn since she was seen by Dr. Mannie Stabile.  This includes her Depakote level as well.  She is not currently being followed by neurologist at this time.  She needs to get back referred with 1.  She reports that she knows her diet is not the best at this time.  And that she needs to lose some weight but it is very difficult as she cannot do physical activity very well secondary to her stroke.  So a lot of it is diet modification.  Only complaints today in the office are her ongoing arthritis and joint swelling which she is being seen by orthopedics for.  Some confusion and agitation which she says is getting a little bit worse but that started around the time that she had a stroke as well.  Some  anxiety and some sleep disturbance.  Overall she has no fevers no chills no cough no shortness of breath no chest pain no leg swelling no palpitations no dizziness no headache no vision changes.  She is currently on disability retired from [employer] and [employer].  Divorced has 2 children which she is close with.  And 7 grandchildren.  Enjoys reading.  She does love Quin[place] Carnes which is why she has weight on her.  As she reports that she enjoys fruits and vegetables as well.  She likes to drink green tea by lifting.  She does drink water but probably not as much as she needs to.  Reports wearing her seatbelt, but she does not drive herself.  She does have smoke detectors in her home.  And she has an aide that stays with her to help give her care as needed. Does not smoke and rarely drinks.  No drug use.  Not sexually active at this time.  She is unsure when her last Pap was it appears that her tetanus needs to be up-to-date as well.  Additionally she needs to have PTF for asthma.  Past Medical, Surgical, Social History, Allergies, and Medications have been Reviewed.   Past Medical History:  Diagnosis Date  .  Asthma   . DDD (degenerative disc disease), lumbar   . Depression   . HTN (hypertension)   . Hyperlipidemia   . Seizures (HCC)    remote  . Stroke [facility](HCC) 1990   chronic right hemiparesis   Past Surgical History:  Procedure Laterality Date  . CESAREAN SECTION    . CHOLECYSTECTOMY N/A 07/13/2014   Procedure: LAPAROSCOPIC CONVERTED TO OPEN  CHOLECYSTECTOMY ;  Surgeon: Darnell Levelodd Gerkin, MD;  Location: [facility];  Service: General;  Laterality: N/A;  . COLONOSCOPY  12/24/2011   Procedure: COLONOSCOPY;  Surgeon: West BaliSandi L Fields, MD;  Location: [facility];  Service: Endoscopy;  Laterality: N/A;  2:00  . [place]  . PARTIAL HYSTERECTOMY     heavy bleeding   Social History   Socioeconomic History  . Marital status: Divorced    Spouse name: Not on file   . Number of children: 2  . Years of education: Not on file  . Highest education level: Not on file  Occupational History  . Occupation: disabled    Associate Professormployer: UNEMPLOYED  Social Needs  . Financial resource strain: Not hard at all  . Food insecurity:    Worry: Never true    Inability: Never true  . Transportation needs:    Medical: No    Non-medical: No  Tobacco Use  . Smoking status: Never Smoker  . Smokeless tobacco: Never Used  Substance and Sexual Activity  . Alcohol use: Yes    Alcohol/week: 2.0 standard drinks    Types: 2 Standard drinks or equivalent per week    Comment: couple of drinks on weekends  . Drug use: No  . Sexual activity: Not Currently  Lifestyle  . Physical activity:    Days per week: 0 days    Minutes per session: 0 min  . Stress: To some extent  Relationships  . Social connections:    Talks on phone: Once a week    Gets together: Once a week    Attends religious service: 1 to 4 times per year    Active member of club or organization: No    Attends meetings of clubs or organizations: Never    Relationship status: Divorced  . Intimate partner violence:    Fear of current or ex partner: No    Emotionally abused: No    Physically abused: No    Forced sexual activity: No  Other Topics Concern  . Not on file  Social History Narrative   On disability. Used to work at [employer]/Worked at [employer] Retired now   [employer]or[employer].    Two children.    Daughter: [id number]    Son: [id number]   Has seven grand children.    Enjoys reading      Diet: eats lots of fruits and veggies, occasional meat: chicken mostly, likes pasta    Caffeine: Tea-green lipton   Water: about 5 cups daily      Wears seatbelt   Smoke detectors       Outpatient Encounter Medications as of 09/08/2018  Medication Sig  . aspirin EC 81 MG tablet Take 1 tablet (81 mg total) by mouth daily.  . diclofenac sodium (VOLTAREN) 1 % GEL Apply 1 application topically 2 (two) times a day.  .  divalproex (DEPAKOTE) 250 MG DR tablet TAKE 1 TABLET BY MOUTH THREE TIMES DAILY  . escitalopram (LEXAPRO) 10 MG tablet TAKE 1 TABLET(10 MG) BY MOUTH DAILY  . oxybutynin (  DITROPAN-XL) 5 MG 24 hr tablet Take 5 mg by mouth daily.  . pravastatin (PRAVACHOL) 20 MG tablet TAKE 1 TABLET BY MOUTH EVERY DAY  . SYMBICORT 80-4.5 MCG/ACT inhaler INHALE 2 PUFFS BY MOUTH EVERY 12 HOURS  . verapamil (CALAN) 120 MG tablet Take 1 tablet (120 mg total) by mouth daily.  [name]. XOPENEX HFA 45 MCG/ACT inhaler INHALE 1 TO 2 PUFFS BY MOUTH EVERY 4 TO 6 HOURS AS NEEDED FOR WHEEZING  . [DISCONTINUED] azithromycin (ZITHROMAX) 250 MG tablet Take 2 tablets by mouth today and then take 1 tablet by mouth once daily until finished (Patient not taking: Reported on 09/08/2018)  . [DISCONTINUED] predniSONE (DELTASONE) 20 MG tablet Take two pills each day for five days (Patient not taking: Reported on 09/08/2018)   No facility-administered encounter medications on file as of 09/08/2018.    No Known Allergies  Review of Systems  Constitutional: Negative for chills and fever.  HENT: Negative.   Eyes: Negative.   Respiratory: Negative for choking and shortness of breath.   Cardiovascular: Negative for chest pain, palpitations and leg swelling.  Gastrointestinal: Negative.   Endocrine: Negative for polydipsia, polyphagia and polyuria.  Genitourinary: Negative.   Musculoskeletal: Positive for arthralgias and joint swelling.       Knee pain RIGHT   Skin: Negative.   Neurological: Positive for weakness. Negative for dizziness and headaches.       Weakness secondary to stroke  Hematological: Negative.   Psychiatric/Behavioral: Positive for agitation, confusion and sleep disturbance. The patient is nervous/anxious.   All other systems reviewed and are negative.      Objective:     BP 116/80   Pulse 78   Temp 98.6 F (37 C) (Oral)   Resp 12   Ht 5\' 2"  (1.575 m)   Wt 222 lb 0.6 oz (100.7 kg)   SpO2 96%   BMI 40.61 kg/m    Physical Exam Vitals signs and nursing note reviewed.  Constitutional:      General: She is awake.     Appearance: Normal appearance. She is well-developed and well-groomed. She is obese.  HENT:     Head: Normocephalic and atraumatic.     Right Ear: External ear normal.     Left Ear: External ear normal.     Nose: Nose normal.  Eyes:     General: No scleral icterus.       Right eye: No discharge.        Left eye: No discharge.     Conjunctiva/sclera: Conjunctivae normal.  Neck:     Musculoskeletal: Normal range of motion and neck supple.  Cardiovascular:     Rate and Rhythm: Normal rate and regular rhythm.     Pulses: Normal pulses.     Heart sounds: Normal heart sounds.  Pulmonary:     Effort: Pulmonary effort is normal.     Breath sounds: Normal breath sounds.  Abdominal:     General: Bowel sounds are normal.  Musculoskeletal:     Right shoulder: She exhibits decreased range of motion and decreased strength.     Right hip: She exhibits decreased strength.     Comments: Right sided weakness demonstrated in upper and lower extremity.  Patient able to ambulate and walk towards provider turn around and slowly walk back towards provider.  Unsteady gait demonstrated during this.  Patient unable to hold arm up greater than 10 to 15 seconds without it slowly coming down.  Skin:    General: Skin is  warm and dry.     Capillary Refill: Capillary refill takes 2 to 3 seconds.  Neurological:     Mental Status: She is alert and oriented to person, place, and time. Mental status is at baseline.     Motor: Weakness present.     Gait: Gait abnormal.  Psychiatric:        Attention and Perception: Attention normal.        Mood and Affect: Mood and affect normal.        Speech: Speech normal.        Behavior: Behavior normal. Behavior is cooperative.        Thought Content: Thought content normal.        Judgment: Judgment normal.        Assessment and Plan        1. Essential  hypertension Controlled, will continue current medication regime will assess her labs and decide if medications are safe for her to continue as far as her kidney and liver functions go.  Encouraged her to maintain a heart healthy low-fat diet.  As this is her main source of being able to maintain her weight.  - CBC with Differential/Platelet - COMPLETE METABOLIC PANEL WITH GFR  2. CVA, old, hemiparesis (HCC) Has a residual old CVA with hemiparalysis of the right side.  She is able to move and do some of her activities of daily living she can take her self to the restroom, she can stand up and sit down on her own.  She can get dressed on her own.  She does fatigue very easily.  Additionally she can only hold her arm up for an extended past amount of time which is about 10 to 15 seconds.  Has an aide that helps with her ADLs. Needs to use a cane, would benefit from a 4 prong cane for better stability.   3. Prediabetes Previous A1c was 5.7.  We will be checking an updated of this.  Again advised her to stay on a heart healthy low-fat diet as well as low carbohydrates and counseled her on the fact that she likes to eat [place] to try to get the veggie pasta to see if she could substitute that.   - Hemoglobin A1c  4. Mixed hyperlipidemia Previously had elevation in her LDL.  Otherwise controlled.  Will not change medications at this time.  Will get a recollection to see how she is doing now.  - Lipid panel  5. Morbid obesity due to excess calories (HCC) Uncontrolled, deteriorated.  Will be providing education over the course of the next couple of months to help get her on a good diet.  As this is good to be her main source of weight loss.  Did encourage her that if she could feel strong enough to try to walk 15 minutes at a time that would be great for her.  To do that a couple times a week.  Secondary to her weakness on the right side the cane might be helpful for her to use right now she does not  want to do that.  - CBC with Differential/Platelet - COMPLETE METABOLIC PANEL WITH GFR - Hemoglobin A1c - Lipid panel - TSH  6. Seizure disorder (HCC) Reports she has not had a seizure in "years".  Has not had her valproic acid level drawn in a while.  And is not currently being followed by neurologist.  Will be referring her back to a neurologist and drawing her Depakote  level as well.  - Valproic acid level  7. Encounter for hepatitis C screening test for low risk patient [place] task force recommendation  - HEP C AB W/REFL  8. Post-menopause We will be assessing vitamin D level as she has had vitamin D deficiency in the past.  Secondary to also being on Depakote which helps to deplete vitamin D levels as well.  - VITAMIN D 25 Hydroxy (Vit-D Deficiency, Fractures)  Follow-up: 3 months or as needed  Patty Finner, DNP, AGNP-BC [facility] [address] [place], [place] [address] Office Hours: Mon-Thurs 8 am-5 pm; Fri 8 am-12 pm Office Phone:  [phone]  Office Fax: [phone]

## 2018-09-08 NOTE — Patient Instructions (Addendum)
Thank you for coming into the office today. I appreciate the opportunity to provide you with the care for your health and wellness. Today we discussed: Establishing care  Please follow-up in 3 months pending what your labs say we might have you follow-up sooner.  Please get all of your labs but your lipids drawn today.  As you are not fasting.  You can return at anytime over the next week to get your lipid levels after you have fasted for at least 8 hours.  We will let you know what your lab.  And if any medication and/or changes to your medications are need we will discuss it with you.  Please work on trying to walk at least 30 minutes a day.  This will can aggravate your knee at first.  But in the long run it will be best for you.  As weight loss can help take pressure off of your knee.  In addition make sure that you are eating a well-balanced diet by eating 2-3 times a day if you cannot eat 2-3 meals a day try to eat every couple hours.  Make sure that you are getting at least 6 to 8 cups of water in daily as well.  To keep hydrated.  Reduce the amount of green tea that you are drinking as this can be dehydrating.  Please continue to practice social distancing during this time to keep you, your family, and our community safe.  If you must go out please continue to wear a mask as you have done so today.  And practice good hand hygiene.   WASH YOUR HANDS WELL AND FREQUENTLY. AVOID TOUCHING YOUR FACE, UNLESS YOUR HANDS ARE FRESHLY WASHED.  GET FRESH AIR DAILY. STAY HYDRATED WITH WATER.   It was a pleasure to see you and I look forward to continuing to work together on your health and well-being. Please do not hesitate to call the office if you need care or have questions about your care.  Have a wonderful day and week. With Gratitude, Tereasa CoopHannah Gali Spinney, DNP, AGNP-BC   [facility]  Walking is a great form of exercise to increase your strength, endurance and overall fitness.  A walking  program can help you start slowly and gradually build endurance as you go.  Everyone's ability is different, so each person's starting point will be different.  You do not have to follow them exactly.  The are just samples. You should simply find out what's right for you and stick to that program.   In the beginning, you'll start off walking 2-3 times a day for short distances.  As you get stronger, you'll be walking further at just 1-2 times per day.  A. You Can Walk For A Certain Length Of Time Each Day    Walk 5 minutes 3 times per day.  Increase 2 minutes every 2 days (3 times per day).  Work up to 25-30 minutes (1-2 times per day).   Example:   Day 1-2 5 minutes 3 times per day   Day 7-8 12 minutes 2-3 times per day   Day 13-14 25 minutes 1-2 times per day  B. You Can Walk For a Certain Distance Each Day     Distance can be substituted for time.    Example:   3 trips to mailbox (at road)   3 trips to corner of block   3 trips around the block  C. Go to local high school and use the  track.     Calorie Counting for Weight Loss Calories are units of energy. Your body needs a certain amount of calories from food to keep you going throughout the day. When you eat more calories than your body needs, your body stores the extra calories as fat. When you eat fewer calories than your body needs, your body burns fat to get the energy it needs. Calorie counting means keeping track of how many calories you eat and drink each day. Calorie counting can be helpful if you need to lose weight. If you make sure to eat fewer calories than your body needs, you should lose weight. Ask your health care provider what a healthy weight is for you. For calorie counting to work, you will need to eat the right number of calories in a day in order to lose a healthy amount of weight per week. A dietitian can help you determine how many calories you need in a day and will give you suggestions on how to reach your  calorie goal.  A healthy amount of weight to lose per week is usually 1-2 lb (0.5-0.9 kg). This usually means that your daily calorie intake should be reduced by 500-750 calories.  Eating 1,200 - 1,500 calories per day can help most women lose weight.  Eating 1,500 - 1,800 calories per day can help most men lose weight.  What do I need to know about calorie counting? In order to meet your daily calorie goal, you will need to:  Find out how many calories are in each food you would like to eat. Try to do this before you eat.  Decide how much of the food you plan to eat.  Write down what you ate and how many calories it had. Doing this is called keeping a food log. To successfully lose weight, it is important to balance calorie counting with a healthy lifestyle that includes regular activity. Aim for 150 minutes of moderate exercise (such as walking) or 75 minutes of vigorous exercise (such as running) each week. Where do I find calorie information?  The number of calories in a food can be found on a Nutrition Facts label. If a food does not have a Nutrition Facts label, try to look up the calories online or ask your dietitian for help. Remember that calories are listed per serving. If you choose to have more than one serving of a food, you will have to multiply the calories per serving by the amount of servings you plan to eat. For example, the label on a package of bread might say that a serving size is 1 slice and that there are 90 calories in a serving. If you eat 1 slice, you will have eaten 90 calories. If you eat 2 slices, you will have eaten 180 calories. How do I keep a food log? Immediately after each meal, record the following information in your food log:  What you ate. Don't forget to include toppings, sauces, and other extras on the food.  How much you ate. This can be measured in cups, ounces, or number of items.  How many calories each food and drink had.  The total number  of calories in the meal. Keep your food log near you, such as in a small notebook in your pocket, or use a mobile app or website. Some programs will calculate calories for you and show you how many calories you have left for the day to meet your goal. What are some  calorie counting tips?   Use your calories on foods and drinks that will fill you up and not leave you hungry: ? Some examples of foods that fill you up are nuts and nut butters, vegetables, lean proteins, and high-fiber foods like whole grains. High-fiber foods are foods with more than 5 g fiber per serving. ? Drinks such as sodas, specialty coffee drinks, alcohol, and juices have a lot of calories, yet do not fill you up.  Eat nutritious foods and avoid empty calories. Empty calories are calories you get from foods or beverages that do not have many vitamins or protein, such as candy, sweets, and soda. It is better to have a nutritious high-calorie food (such as an avocado) than a food with few nutrients (such as a bag of chips).  Know how many calories are in the foods you eat most often. This will help you calculate calorie counts faster.  Pay attention to calories in drinks. Low-calorie drinks include water and unsweetened drinks.  Pay attention to nutrition labels for "low fat" or "fat free" foods. These foods sometimes have the same amount of calories or more calories than the full fat versions. They also often have added sugar, starch, or salt, to make up for flavor that was removed with the fat.  Find a way of tracking calories that works for you. Get creative. Try different apps or programs if writing down calories does not work for you. What are some portion control tips?  Know how many calories are in a serving. This will help you know how many servings of a certain food you can have.  Use a measuring cup to measure serving sizes. You could also try weighing out portions on a kitchen scale. With time, you will be able to  estimate serving sizes for some foods.  Take some time to put servings of different foods on your favorite plates, bowls, and cups so you know what a serving looks like.  Try not to eat straight from a bag or box. Doing this can lead to overeating. Put the amount you would like to eat in a cup or on a plate to make sure you are eating the right portion.  Use smaller plates, glasses, and bowls to prevent overeating.  Try not to multitask (for example, watch TV or use your computer) while eating. If it is time to eat, sit down at a table and enjoy your food. This will help you to know when you are full. It will also help you to be aware of what you are eating and how much you are eating. What are tips for following this plan? Reading food labels  Check the calorie count compared to the serving size. The serving size may be smaller than what you are used to eating.  Check the source of the calories. Make sure the food you are eating is high in vitamins and protein and low in saturated and trans fats. Shopping  Read nutrition labels while you shop. This will help you make healthy decisions before you decide to purchase your food.  Make a grocery list and stick to it. Cooking  Try to cook your favorite foods in a healthier way. For example, try baking instead of frying.  Use low-fat dairy products. Meal planning  Use more fruits and vegetables. Half of your plate should be fruits and vegetables.  Include lean proteins like poultry and fish. How do I count calories when eating out?  Ask for smaller portion  sizes.  Consider sharing an entree and sides instead of getting your own entree.  If you get your own entree, eat only half. Ask for a box at the beginning of your meal and put the rest of your entree in it so you are not tempted to eat it.  If calories are listed on the menu, choose the lower calorie options.  Choose dishes that include vegetables, fruits, whole grains, low-fat  dairy products, and lean protein.  Choose items that are boiled, broiled, grilled, or steamed. Stay away from items that are buttered, battered, fried, or served with cream sauce. Items labeled "crispy" are usually fried, unless stated otherwise.  Choose water, low-fat milk, unsweetened iced tea, or other drinks without added sugar. If you want an alcoholic beverage, choose a lower calorie option such as a glass of wine or light beer.  Ask for dressings, sauces, and syrups on the side. These are usually high in calories, so you should limit the amount you eat.  If you want a salad, choose a garden salad and ask for grilled meats. Avoid extra toppings like bacon, cheese, or fried items. Ask for the dressing on the side, or ask for olive oil and vinegar or lemon to use as dressing.  Estimate how many servings of a food you are given. For example, a serving of cooked rice is  cup or about the size of half a baseball. Knowing serving sizes will help you be aware of how much food you are eating at restaurants. The list below tells you how big or small some common portion sizes are based on everyday objects: ? 1 oz-4 stacked dice. ? 3 oz-1 deck of cards. ? 1 tsp-1 die. ? 1 Tbsp- a ping-pong ball. ? 2 Tbsp-1 ping-pong ball. ?  cup- baseball. ? 1 cup-1 baseball. Summary  Calorie counting means keeping track of how many calories you eat and drink each day. If you eat fewer calories than your body needs, you should lose weight.  A healthy amount of weight to lose per week is usually 1-2 lb (0.5-0.9 kg). This usually means reducing your daily calorie intake by 500-750 calories.  The number of calories in a food can be found on a Nutrition Facts label. If a food does not have a Nutrition Facts label, try to look up the calories online or ask your dietitian for help.  Use your calories on foods and drinks that will fill you up, and not on foods and drinks that will leave you hungry.  Use smaller  plates, glasses, and bowls to prevent overeating. This information is not intended to replace advice given to you by your health care provider. Make sure you discuss any questions you have with your health care provider. Document Released: 03/18/2005 Document Revised: 12/05/2017 Document Reviewed: 02/16/2016 Elsevier Interactive Patient Education  2019 [employer]

## 2018-09-09 DIAGNOSIS — E782 Mixed hyperlipidemia: Secondary | ICD-10-CM | POA: Diagnosis not present

## 2018-09-09 LAB — LIPID PANEL
Cholesterol: 170 mg/dL (ref <200)
HDL: 47 mg/dL — ABNORMAL LOW (ref >=50)
LDL Cholesterol (Calc): 101 mg/dL (calc) — ABNORMAL HIGH
Non-HDL Cholesterol (Calc): 123 mg/dL (calc) (ref <130)
Total CHOL/HDL Ratio: 3.6 (calc) (ref <5.0)
Triglycerides: 120 mg/dL (ref <150)

## 2018-09-09 NOTE — Progress Notes (Signed)
Overall labs are good. Liver, Kidney and thyroid good. Bad cholesterol (LDL) has lowered in last 10 months. Still needs to come down alittle more, watch fried and fatty food intake. A1c is holding at 5.7%-stable from last check. Vitamin D is lower end of normal. Will need to keep an eye on this. Might need supplementation to avoid it going lower. Can get vitamin D at drug store take 1,000 IU daily.  As the vitamin D level can be impacted due to sz medication. Additionally, her depakote (valproic acid level) is low. Advised to continue current dose and we will refer to neurology for sz.

## 2018-09-10 LAB — CBC WITH DIFFERENTIAL/PLATELET
Absolute Monocytes: 300 cells/uL (ref 200–950)
Basophils Absolute: 48 cells/uL (ref 0–200)
Basophils Relative: 0.8 %
Eosinophils Absolute: 402 cells/uL (ref 15–500)
Eosinophils Relative: 6.7 %
HCT: 41.7 % (ref 35.0–45.0)
Hemoglobin: 14.2 g/dL (ref 11.7–15.5)
Lymphs Abs: 2496 cells/uL (ref 850–3900)
MCH: 30.5 pg (ref 27.0–33.0)
MCHC: 34.1 g/dL (ref 32.0–36.0)
MCV: 89.7 fL (ref 80.0–100.0)
MPV: 12 fL (ref 7.5–12.5)
Monocytes Relative: 5 %
Neutro Abs: 2754 cells/uL (ref 1500–7800)
Neutrophils Relative %: 45.9 %
Platelets: 265 10*3/uL (ref 140–400)
RBC: 4.65 10*6/uL (ref 3.80–5.10)
RDW: 12.6 % (ref 11.0–15.0)
Total Lymphocyte: 41.6 %
WBC: 6 10*3/uL (ref 3.8–10.8)

## 2018-09-10 LAB — HEMOGLOBIN A1C
Hgb A1c MFr Bld: 5.7 % of total Hgb — ABNORMAL HIGH (ref <5.7)
Mean Plasma Glucose: 117 (calc)
eAG (mmol/L): 6.5 (calc)

## 2018-09-10 LAB — VALPROIC ACID LEVEL: Valproic Acid Lvl: 42.7 mg/L — ABNORMAL LOW (ref 50.0–100.0)

## 2018-09-10 LAB — COMPLETE METABOLIC PANEL WITH GFR
AG Ratio: 1.2 (calc) (ref 1.0–2.5)
ALT: 16 U/L (ref 6–29)
AST: 14 U/L (ref 10–35)
Albumin: 4.1 g/dL (ref 3.6–5.1)
Alkaline phosphatase (APISO): 86 U/L (ref 37–153)
BUN: 14 mg/dL (ref 7–25)
CO2: 27 mmol/L (ref 20–32)
Calcium: 9.5 mg/dL (ref 8.6–10.4)
Chloride: 107 mmol/L (ref 98–110)
Creat: 0.89 mg/dL (ref 0.50–1.05)
GFR, Est African American: 85 mL/min/{1.73_m2} (ref >=60)
GFR, Est Non African American: 73 mL/min/{1.73_m2} (ref >=60)
Globulin: 3.3 g/dL (calc) (ref 1.9–3.7)
Glucose, Bld: 92 mg/dL (ref 65–139)
Potassium: 3.5 mmol/L (ref 3.5–5.3)
Sodium: 142 mmol/L (ref 135–146)
Total Bilirubin: 0.4 mg/dL (ref 0.2–1.2)
Total Protein: 7.4 g/dL (ref 6.1–8.1)

## 2018-09-10 LAB — VITAMIN D 25 HYDROXY (VIT D DEFICIENCY, FRACTURES): Vit D, 25-Hydroxy: 37 ng/mL (ref 30–100)

## 2018-09-10 LAB — HEP C AB W/REFL
HEPATITIS C ANTIBODY REFILL$(REFL): NONREACTIVE
SIGNAL TO CUT-OFF: 0.06 (ref <1.00)

## 2018-09-10 LAB — TSH: TSH: 1.37 mIU/L

## 2018-09-10 LAB — REFLEX TIQ

## 2018-09-22 ENCOUNTER — Telehealth: Payer: Self-pay | Admitting: *Deleted

## 2018-09-22 DIAGNOSIS — R2681 Unsteadiness on feet: Secondary | ICD-10-CM

## 2018-09-22 NOTE — Telephone Encounter (Signed)
Sent to [facility] per request

## 2018-09-22 NOTE — Telephone Encounter (Signed)
Patty [name]

## 2018-09-22 NOTE — Telephone Encounter (Signed)
Pt called requesting a four point cane be sent to [facility].

## 2018-09-22 NOTE — Telephone Encounter (Signed)
Do you agree with her request?

## 2018-09-22 NOTE — Telephone Encounter (Signed)
Lori with [employer] called requesting extra information on why Ms. Heard needed the cane for unsteady gate. Can be reached at [phone]

## 2018-09-22 NOTE — Telephone Encounter (Signed)
They need the last OV note to state why a walking cane is needed and faxed to [phone]

## 2018-09-23 NOTE — Telephone Encounter (Signed)
Faxed ov notes to [place] at [facility]

## 2018-10-05 ENCOUNTER — Telehealth: Payer: Self-pay | Admitting: Family Medicine

## 2018-10-05 NOTE — Telephone Encounter (Signed)
Spoke with patient. It is her right leg that is swelling, the same side she had the stroke on. No pain or heat. Just swelling and makes it difficult for her to walk. Patient aware you aren't in the office today and it may be tomorrow before we are able to get an answer.

## 2018-10-05 NOTE — Telephone Encounter (Signed)
Pt would like a xray of her leg it is swelling, and would like to know if a cane was ordered

## 2018-10-06 NOTE — Telephone Encounter (Signed)
Patient scheduled for tomorrow at 9:20am.

## 2018-10-07 ENCOUNTER — Ambulatory Visit (INDEPENDENT_AMBULATORY_CARE_PROVIDER_SITE_OTHER): Payer: Medicare Other | Admitting: Family Medicine

## 2018-10-07 ENCOUNTER — Ambulatory Visit (HOSPITAL_COMMUNITY)
Admission: RE | Admit: 2018-10-07 | Discharge: 2018-10-07 | Disposition: A | Discharge status: Home / Self Care | Payer: Medicare Other | Source: Ambulatory Visit | Attending: Family Medicine | Admitting: Family Medicine

## 2018-10-07 ENCOUNTER — Other Ambulatory Visit: Payer: Self-pay

## 2018-10-07 ENCOUNTER — Encounter: Payer: Self-pay | Admitting: Family Medicine

## 2018-10-07 VITALS — BP 119/79 | HR 67 | Temp 98.1°F | Ht 62.0 in | Wt 221.1 lb

## 2018-10-07 DIAGNOSIS — M25561 Pain in right knee: Secondary | ICD-10-CM

## 2018-10-07 NOTE — Patient Instructions (Addendum)
Thank you for coming into the office today. I appreciate the opportunity to provide you with the care for your health and wellness. Today we discussed: knee pain (RIGHT)  Follow Up: as needed (next appt is in Sept already schedule)  Xray ordered today, can get this at [facility]  Please keep your CANE with you at all times.  Continue to use the Voltaren gel as directed on the prescription.  You can get a compression sleeve to help support the knee better as well see below.  Ice the knee after you have been up walking around. Keep the leg elevated after walking as well to help reduce swelling.  Pending your xray results, I will probably need to refer you to Ortho for better examination and workup.  Weight loss can help with chronic knee pain.  Make sure you are eating a well balanced diet.  Please continue to practice social distancing to keep you, your family, and our community safe.  If you must go out, please wear a Mask and practice good handwashing.  [place] YOUR HANDS WELL AND FREQUENTLY. AVOID TOUCHING YOUR FACE, UNLESS YOUR HANDS ARE FRESHLY WASHED.  GET FRESH AIR DAILY. STAY HYDRATED WITH WATER.   It was a pleasure to see you and I look forward to continuing to work together on your health and well-being. Please do not hesitate to call the office if you need care or have questions about your care.  Have a wonderful day and week.  With Gratitude,  Cherly Beach, DNP, AGNP-BC   Acute Knee Pain, Adult Many things can cause knee pain. Sometimes, knee pain is sudden (acute) and may be caused by damage, swelling, or irritation of the muscles and tissues that support your knee. The pain often goes away on its own with time and rest. If the pain does not go away, tests may be done to find out what is causing the pain. Follow these instructions at home: Pay attention to any changes in your symptoms. Take these actions to relieve your pain. If you have a knee sleeve or  brace:   Wear the sleeve or brace as told by your doctor. Remove it only as told by your doctor.  Loosen the sleeve or brace if your toes: ? Tingle. ? Become numb. ? Turn cold and blue.  Keep the sleeve or brace clean.  If the sleeve or brace is not waterproof: ? Do not let it get wet. ? Cover it with a watertight covering when you take a bath or shower. Activity  Rest your knee.  Do not do things that cause pain.  Avoid activities where both feet leave the ground at the same time (high-impact activities). Examples are running, jumping rope, and doing jumping jacks.  Work with a physical therapist to make a safe exercise program, as told by your doctor. Managing pain, stiffness, and swelling   If told, put ice on the knee: ? Put ice in a plastic bag. ? Place a towel between your skin and the bag. ? Leave the ice on for 20 minutes, 2-3 times a day.  If told, put pressure (compression) on your injured knee to control swelling, give support, and help with discomfort. Compression may be done with an elastic bandage. General instructions  Take all medicines only as told by your doctor.  Raise (elevate) your knee while you are sitting or lying down. Make sure your knee is higher than your heart.  Sleep with a pillow under  your knee.  Do not use any products that contain nicotine or tobacco. These include cigarettes, e-cigarettes, and chewing tobacco. These products may slow down healing. If you need help quitting, ask your doctor.  If you are overweight, work with your doctor and a food expert (dietitian) to set goals to lose weight. Being overweight can make your knee hurt more.  Keep all follow-up visits as told by your doctor. This is important. Contact a doctor if:  The knee pain does not stop.  The knee pain changes or gets worse.  You have a fever along with knee pain.  Your knee feels warm when you touch it.  Your knee gives out or locks up. Get help right  away if:  Your knee swells, and the swelling gets worse.  You cannot move your knee.  You have very bad knee pain. Summary  Many things can cause knee pain. The pain often goes away on its own with time and rest.  Your doctor may do tests to find out the cause of the pain.  Pay attention to any changes in your symptoms. Relieve your pain with rest, medicines, light activity, and use of ice.  Get help right away if you cannot move your knee or your knee pain is very bad. This information is not intended to replace advice given to you by your health care provider. Make sure you discuss any questions you have with your health care provider. Document Released: 06/14/2008 Document Revised: 08/28/2017 Document Reviewed: 08/28/2017 Elsevier Patient Education  2020 Elsevier Inc.   RICE Therapy for Routine Care of Injuries Many injuries can be cared for with rest, ice, compression, and elevation (RICE therapy). This includes:  Resting the injured part.  Putting ice on the injury.  Putting pressure (compression) on the injury.  Raising the injured part (elevation). Using RICE therapy can help to lessen pain and swelling. Supplies needed:  Ice.  Plastic bag.  Towel.  Elastic bandage.  Pillow or pillows to raise (elevate) your injured body part. How to care for your injury with RICE therapy Rest Limit your normal activities, and try not to use the injured part of your body. You can go back to your normal activities when your doctor says it is okay to do them and you feel okay. Ask your doctor if you should do exercises to help your injury get better. Ice Put ice on the injured area. Do not put ice on your bare skin.  Put ice in a plastic bag.  Place a towel between your skin and the bag.  Leave the ice on for 20 minutes, 2-3 times a day. Use ice on as many days as told by your doctor.  Compression Compression means putting pressure on the injured area. This can be done  with an elastic bandage. If an elastic bandage has been put on your injury:  Do not wrap the bandage too tight. Wrap the bandage more loosely if part of your body away from the bandage is blue, swollen, cold, painful, or loses feeling (gets numb).  Take off the bandage and put it on again. Do this every 3-4 hours or as told by your doctor.  See your doctor if the bandage seems to make your problems worse.  Elevation Elevation means keeping the injured area raised. If you can, raise the injured area above your heart or the center of your chest. Contact a doctor if:  You keep having pain and swelling.  Your symptoms get worse.  Get help right away if:  You have sudden bad pain at your injury or lower than your injury.  You have redness or more swelling around your injury.  You have tingling or numbness at your injury or lower than your injury, and it does not go away when you take off the bandage. Summary  Many injuries can be cared for using rest, ice, compression, and elevation (RICE therapy).  You can go back to your normal activities when you feel okay and your doctor says it is okay.  Put ice on the injured area as told by your doctor.  Get help if your symptoms get worse or if you keep having pain and swelling. This information is not intended to replace advice given to you by your health care provider. Make sure you discuss any questions you have with your health care provider. Document Released: 09/04/2007 Document Revised: 12/06/2016 Document Reviewed: 12/06/2016 Elsevier Patient Education  2020 [employer]

## 2018-10-07 NOTE — Progress Notes (Signed)
Subjective:     Patient ID: Patty [name], female   DOB: 14-Jul-1965, 53 y.o.   MRN: [id number]  Patty [name] presents for Knee Pain (RIGHT)  Patty [name] has a significant history for hypertension, asthma, seizure disorder.  Patty [name] reports today for an office visit.  She cannot identify a cause of the discomfort.  She reports that it feels tight, swollen sensation has some lateral knee and lower thigh pain.  Some below the kneecap as well.  She reports that it hurts to bend, has become hard to do her activities of daily living.  She denies having any popping sensation when first asked, but then circles back around and describes that she might of heard a pop before.  She is worried that it might give out on her.  She is currently using a 4 pronged cane which she did not bring to the office today.  She requires assistance to get into the office lamination room and back into the checkout area.  She denies injury, denies falls or any form of trauma that she is aware of.  She reports that she has been using the Voltaren gel, unsure if she is using too much.  Additionally she reports that she has been using Advil.  Today patient denies signs and symptoms of COVID 19 infection including fever, chills, cough, shortness of breath, and headache.  Past Medical, Surgical, Social History, Allergies, and Medications have been Reviewed.  Past Medical History:  Diagnosis Date  . Asthma   . DDD (degenerative disc disease), lumbar   . Depression   . HTN (hypertension)   . Hyperlipidemia   . Seizures (HCC)    remote  . Stroke [facility]) 1990   chronic right hemiparesis   Past Surgical History:  Procedure Laterality Date  . CESAREAN SECTION    . CHOLECYSTECTOMY N/A 07/13/2014   Procedure: LAPAROSCOPIC CONVERTED TO OPEN  CHOLECYSTECTOMY ;  Surgeon: Darnell Levelodd Gerkin, MD;  [facility]: [facility];  Service: General;  Laterality: N/A;  . COLONOSCOPY  12/24/2011   Procedure: COLONOSCOPY;  Surgeon: West BaliSandi L Fields, MD;   [facility];  Service: Endoscopy;  Laterality: N/A;  2:00  . [place]  . PARTIAL HYSTERECTOMY     heavy bleeding   Social History   Socioeconomic History  . Marital status: Divorced    Spouse name: Not on file  . Number of children: 2  . Years of education: Not on file  . Highest education level: Not on file  Occupational History  . Occupation: disabled    Associate Professormployer: UNEMPLOYED  Social Needs  . Financial resource strain: Not hard at all  . Food insecurity    Worry: Never true    Inability: Never true  . Transportation needs    Medical: No    Non-medical: No  Tobacco Use  . Smoking status: Never Smoker  . Smokeless tobacco: Never Used  Substance and Sexual Activity  . Alcohol use: Yes    Alcohol/week: 2.0 standard drinks    Types: 2 Standard drinks or equivalent per week    Comment: couple of drinks on weekends  . Drug use: No  . Sexual activity: Not Currently  Lifestyle  . Physical activity    Days per week: 0 days    Minutes per session: 0 min  . Stress: To some extent  Relationships  . Social connections    Talks on phone: Once a week  Gets together: Once a week    Attends religious service: 1 to 4 times per year    Active member of club or organization: No    Attends meetings of clubs or organizations: Never    Relationship status: Divorced  . Intimate partner violence    Fear of current or ex partner: No    Emotionally abused: No    Physically abused: No    Forced sexual activity: No  Other Topics Concern  . Not on file  Social History Narrative   On disability. Used to work at [employer] and [employer]/Worked at [employer] Retired now   [employer]    Two children.    Daughter: 65    Son: 37   Has seven grand children.    Enjoys reading      Diet: eats lots of fruits and veggies, occasional meat: chicken mostly, likes pasta    Caffeine: Tea-green lipton   Water: about 5 cups daily      Wears seatbelt    Smoke detectors       Outpatient Encounter Medications as of 10/07/2018  Medication Sig  . aspirin EC 81 MG tablet Take 1 tablet (81 mg total) by mouth daily.  . diclofenac sodium (VOLTAREN) 1 % GEL Apply 1 application topically 2 (two) times a day.  . divalproex (DEPAKOTE) 250 MG DR tablet TAKE 1 TABLET BY MOUTH THREE TIMES DAILY  . escitalopram (LEXAPRO) 10 MG tablet TAKE 1 TABLET(10 MG) BY MOUTH DAILY  . oxybutynin (DITROPAN-XL) 5 MG 24 hr tablet Take 5 mg by mouth daily.  . pravastatin (PRAVACHOL) 20 MG tablet TAKE 1 TABLET BY MOUTH EVERY DAY  . SYMBICORT 80-4.5 MCG/ACT inhaler INHALE 2 PUFFS BY MOUTH EVERY 12 HOURS  . verapamil (CALAN) 120 MG tablet Take 1 tablet (120 mg total) by mouth daily.  Penne Lash HFA 45 MCG/ACT inhaler INHALE 1 TO 2 PUFFS BY MOUTH EVERY 4 TO 6 HOURS AS NEEDED FOR WHEEZING   No facility-administered encounter medications on file as of 10/07/2018.    No Known Allergies  Review of Systems  Constitutional: Negative for activity change, appetite change, chills and fever.  HENT: Negative.   Eyes: Negative.   Respiratory: Negative for cough and shortness of breath.   Cardiovascular: Negative.   Gastrointestinal: Negative.   Endocrine: Negative.   Genitourinary: Negative.   Musculoskeletal: Positive for arthralgias and joint swelling.  Skin: Negative.   Allergic/Immunologic: Negative.   Neurological: Negative.   Hematological: Negative.   Psychiatric/Behavioral: Negative.   All other systems reviewed and are negative.      Objective:     BP 119/79 (BP Location: Left Arm, Patient Position: Sitting, Cuff Size: Normal)   Pulse 67   Temp 98.1 F (36.7 C) (Oral)   Ht 5\' 2"  (1.575 m)   Wt 221 lb 1.9 oz (100.3 kg)   SpO2 97%   BMI 40.44 kg/m   Physical Exam Vitals signs and nursing note reviewed.  Constitutional:      Appearance: Normal appearance. She is obese.  HENT:     Head: Normocephalic and atraumatic.     Right Ear: External ear normal.      Left Ear: External ear normal.     Nose: Nose normal.  Eyes:     General:        Right eye: No discharge.        Left eye: No discharge.     Conjunctiva/sclera: Conjunctivae normal.  Neck:     Musculoskeletal:  Normal range of motion and neck supple.  Cardiovascular:     Rate and Rhythm: Normal rate and regular rhythm.     Pulses: Normal pulses.     Heart sounds: Normal heart sounds.  Pulmonary:     Effort: Pulmonary effort is normal.     Breath sounds: Normal breath sounds.  Musculoskeletal:     Right knee: She exhibits decreased range of motion, swelling and effusion. She exhibits no ecchymosis, no deformity, normal patellar mobility and no bony tenderness. No tenderness found. No medial joint line, no lateral joint line, no MCL, no LCL and no patellar tendon tenderness noted.  Skin:    General: Skin is warm and dry.     Capillary Refill: Capillary refill takes less than 2 seconds.  Neurological:     Mental Status: She is alert and oriented to person, place, and time.  Psychiatric:        Attention and Perception: Attention and perception normal.        Mood and Affect: Mood and affect normal.        Speech: Speech normal.        Behavior: Behavior normal. Behavior is cooperative.        Thought Content: Thought content normal.        Cognition and Memory: Cognition and memory normal.        Judgment: Judgment normal.        Assessment and Plan       1. Right knee pain, unspecified chronicity No noted site of concern, extreme trouble walking in office. No tenderness of lateral knee. Will get xray to rule out arthritis and some other etiologies. Referral might be needed. RICE and continuing Voltaren gel at this time. Written material provided for reference. Encourage use of cane as needed.  - DG Knee Complete 4 Views Right; Future   Return if symptoms worsen or fail to improve.       Freddy FinnerHannah M. Terrionna Bridwell, DNP, AGNP-BC [facility]  [address], [address] [place], [place] [address] Office Hours: Mon-Thurs 8 am-5 pm; Fri 8 am-12 pm Office Phone:  [phone]  Office Fax: [phone]

## 2018-10-08 NOTE — Progress Notes (Signed)
Please let Ms Ruble know that there is mild medial compartment joint space loss and osteophytosis and a large, nonspecific right knee joint effusion. I think referral to Aline Brochure would be beneficial, if she agrees, please place order for it.

## 2018-10-12 ENCOUNTER — Telehealth: Payer: Self-pay | Admitting: Family Medicine

## 2018-10-12 NOTE — Telephone Encounter (Signed)
Left message to call back if she agrees with referral recommendation

## 2018-10-12 NOTE — Telephone Encounter (Signed)
Pt called back and said she did not understand the message that was left on her voice mail please call back

## 2018-10-12 NOTE — Telephone Encounter (Signed)
Pt LVM that she would like to know the Xray results

## 2018-10-13 ENCOUNTER — Telehealth: Payer: Self-pay | Admitting: Family Medicine

## 2018-10-13 NOTE — Telephone Encounter (Signed)
Patient aware of results and referrals

## 2018-10-13 NOTE — Telephone Encounter (Signed)
Pt is calling regarding a referral to a ortho Dr, said that Jarrett Soho was suppose to refer

## 2018-10-13 NOTE — Telephone Encounter (Signed)
Referral has been made to [facility] Dr Luna Glasgow

## 2018-10-14 ENCOUNTER — Ambulatory Visit: Payer: Medicare Other | Admitting: Neurology

## 2018-10-19 ENCOUNTER — Telehealth: Payer: Self-pay | Admitting: Family Medicine

## 2018-10-19 ENCOUNTER — Telehealth: Payer: Self-pay

## 2018-10-19 DIAGNOSIS — M25561 Pain in right knee: Secondary | ICD-10-CM

## 2018-10-19 NOTE — Telephone Encounter (Signed)
Referral placed for Dr.Harrison patient aware

## 2018-10-28 ENCOUNTER — Other Ambulatory Visit: Payer: Self-pay

## 2018-10-28 ENCOUNTER — Ambulatory Visit (INDEPENDENT_AMBULATORY_CARE_PROVIDER_SITE_OTHER): Payer: Medicare Other | Admitting: Orthopedic Surgery

## 2018-10-28 ENCOUNTER — Encounter: Payer: Self-pay | Admitting: Orthopedic Surgery

## 2018-10-28 VITALS — BP 141/76 | HR 71 | Temp 97.9°F | Ht 62.0 in | Wt 221.0 lb

## 2018-10-28 DIAGNOSIS — M25461 Effusion, right knee: Secondary | ICD-10-CM

## 2018-10-28 MED ORDER — MELOXICAM 7.5 MG PO TABS: 7.5000 mg | ORAL_TABLET | Freq: Every day | ORAL | 5 refills | Status: DC

## 2018-10-28 NOTE — Progress Notes (Signed)
Patty [name]  10/28/2018  HISTORY SECTION :  Chief Complaint  Patient presents with  . Knee Pain    right knee pain for few months   . Gait Problem    patient states sometimes she can walk and sometimes she can't   HPI The patient presents for evaluation of pain swelling right knee history of stroke right side Location 2 months of pain in the right knee diffuse anteromedial pain  Quality dull ache Severity mild to moderate seems to be popping worse with walking no injury  Review of Systems  Neurological: Positive for weakness.  All other systems reviewed and are negative.    Past Medical History:  Diagnosis Date  . Asthma   . DDD (degenerative disc disease), lumbar   . Depression   . HTN (hypertension)   . Hyperlipidemia   . Seizures ([place])    remote  . Stroke [facility]) 1990   chronic right hemiparesis    Past Surgical History:  Procedure Laterality Date  . CESAREAN SECTION    . CHOLECYSTECTOMY N/A 07/13/2014   Procedure: LAPAROSCOPIC CONVERTED TO OPEN  CHOLECYSTECTOMY ;  Surgeon: Armandina Gemma, MD;  [facility];  Service: General;  Laterality: N/A;  . COLONOSCOPY  12/24/2011   Procedure: COLONOSCOPY;  Surgeon: Danie Binder, MD;  [facility];  Service: Endoscopy;  Laterality: N/A;  2:00  . GALLBLADDER SURGERY     [name] /[place]  . PARTIAL HYSTERECTOMY     heavy bleeding     No Known Allergies   Current Outpatient Medications:  .  aspirin EC 81 MG tablet, Take 1 tablet (81 mg total) by mouth daily., Disp: , Rfl:  .  diclofenac sodium (VOLTAREN) 1 % GEL, Apply 1 application topically 2 (two) times a day., Disp: , Rfl:  .  divalproex (DEPAKOTE) 250 MG DR tablet, TAKE 1 TABLET BY MOUTH THREE TIMES DAILY, Disp: 270 tablet, Rfl: 2 .  escitalopram (LEXAPRO) 10 MG tablet, TAKE 1 TABLET(10 MG) BY MOUTH DAILY, Disp: 90 tablet, Rfl: 1 .  oxybutynin (DITROPAN-XL) 5 MG 24 hr tablet, Take 5 mg by mouth daily., Disp: , Rfl:  .  pravastatin (PRAVACHOL)  20 MG tablet, TAKE 1 TABLET BY MOUTH EVERY DAY, Disp: 30 tablet, Rfl: 0 .  SYMBICORT 80-4.5 MCG/ACT inhaler, INHALE 2 PUFFS BY MOUTH EVERY 12 HOURS, Disp: 10.2 g, Rfl: 2 .  verapamil (CALAN) 120 MG tablet, Take 1 tablet (120 mg total) by mouth daily., Disp: 90 tablet, Rfl: 1 .  XOPENEX HFA 45 MCG/ACT inhaler, INHALE 1 TO 2 PUFFS BY MOUTH EVERY 4 TO 6 HOURS AS NEEDED FOR WHEEZING, Disp: 15 g, Rfl: 0   PHYSICAL EXAM SECTION: 1) BP (!) 141/76   Pulse 71   Ht 5\' 2"  (1.575 m)   Wt 221 lb (100.2 kg)   BMI 40.42 kg/m   Body mass index is 40.42 kg/m. General appearance: Well-developed well-nourished no gross deformities  2) Cardiovascular normal pulse and perfusion in all 4 extremities normal color without edema  3) Neurologically deep tendon reflexes are equal and normal, no sensation loss or deficits no pathologic reflexes  4) Psychological: Awake alert and oriented x3 mood and affect normal  5) Skin no lacerations or ulcerations no nodularity no palpable masses, no erythema or nodularity  6) Musculoskeletal:   Joint effusion right knee tenderness medial lateral joint lines with decreased range of motion stable knee no meniscal signs muscle tone normal   MEDICAL DECISION SECTION:  No diagnosis found.  Imaging X-rays were done elsewhere no fracture dislocation was seen no acute process was seen  Plan:  (Rx., Inj., surg., Frx, MRI/CT, XR:2) Aspiration injection right knee Ice 3 times a day Start medication for pain  Meds ordered this encounter  Medications  . meloxicam (MOBIC) 7.5 MG tablet    Sig: Take 1 tablet (7.5 mg total) by mouth daily.    Dispense:  30 tablet    Refill:  5      3:10 PM Fuller CanadaStanley Harrison, MD  10/28/2018

## 2018-11-18 ENCOUNTER — Ambulatory Visit: Payer: Medicare Other | Admitting: Neurology

## 2018-11-24 ENCOUNTER — Other Ambulatory Visit (HOSPITAL_COMMUNITY): Payer: Self-pay | Admitting: Family Medicine

## 2018-11-30 ENCOUNTER — Ambulatory Visit: Payer: Medicare Other | Admitting: Orthopedic Surgery

## 2018-11-30 ENCOUNTER — Encounter: Payer: Self-pay | Admitting: Orthopedic Surgery

## 2018-12-03 ENCOUNTER — Telehealth: Payer: Self-pay | Admitting: *Deleted

## 2018-12-03 ENCOUNTER — Other Ambulatory Visit: Payer: Self-pay | Admitting: Family Medicine

## 2018-12-03 DIAGNOSIS — N644 Mastodynia: Secondary | ICD-10-CM

## 2018-12-03 DIAGNOSIS — Z1231 Encounter for screening mammogram for malignant neoplasm of breast: Secondary | ICD-10-CM

## 2018-12-03 NOTE — Telephone Encounter (Signed)
Right breast hurting sometimes, sometimes sharp, takes Aleve and it relieves it. Patient is due for mammogram. Sometimes it may be both breasts. The pain has come and gone for a few weeks now.

## 2018-12-03 NOTE — Telephone Encounter (Signed)
Spoke with patient and let her know that she could call and schedule when her schedule allowed

## 2018-12-03 NOTE — Telephone Encounter (Signed)
Pt LVM wanting a mammogram order put in to [facility] as she was having pain in her breast. She called to schedule but they told her there was no order.

## 2018-12-09 ENCOUNTER — Encounter: Payer: Self-pay | Admitting: Family Medicine

## 2018-12-09 ENCOUNTER — Other Ambulatory Visit: Payer: Self-pay

## 2018-12-09 ENCOUNTER — Ambulatory Visit (INDEPENDENT_AMBULATORY_CARE_PROVIDER_SITE_OTHER): Payer: Medicare Other | Admitting: Family Medicine

## 2018-12-09 DIAGNOSIS — I10 Essential (primary) hypertension: Secondary | ICD-10-CM

## 2018-12-09 DIAGNOSIS — G40909 Epilepsy, unspecified, not intractable, without status epilepticus: Secondary | ICD-10-CM | POA: Diagnosis not present

## 2018-12-09 DIAGNOSIS — E782 Mixed hyperlipidemia: Secondary | ICD-10-CM

## 2018-12-09 NOTE — Patient Instructions (Addendum)
  Thank you for completing your visit via telephone. I appreciate the opportunity to provide you with the care for your health and wellness. Today we discussed: Overall health  Follow-up: Need to appointments: Flu shot clinic appointment and an appointment in 4 months for follow-up with me regarding chronic conditions.   No labs today  Continue to work on exercise and diet changes to help with overall health.  Encouraged to continue taking medications as directed.    Please continue to practice social distancing to keep you, your family, and our community safe.  If you must go out, please wear a Mask and practice good handwashing.  Weston Fortun[place] Betty YOUR HANDS WELL AND FREQUENTLY. AVOID TOUCHING YOUR FACE, UNLESS YOUR HANDS ARE FRESHLY WASHED.  GET FRESH AIR DAILY. STAY HYDRATED WITH WATER.   It was a pleasure to see you and I look forward to continuing to work together on your health and well-being. Please do not hesitate to call the office if you need care or have questions about your care.  Have a wonderful day and week.  With Gratitude,  Cherly Beach, DNP, AGNP-BC

## 2018-12-09 NOTE — Progress Notes (Signed)
Virtual Visit via Telephone Note   This visit type was conducted due to national recommendations for restrictions regarding the COVID-19 Pandemic (e.g. social distancing) in an effort to limit this patient's exposure and mitigate transmission in our community.  Due to her co-morbid illnesses, this patient is at least at moderate risk for complications without adequate follow up.  This format is felt to be most appropriate for this patient at this time.  The patient did not have access to video technology/had technical difficulties with video requiring transitioning to audio format only (telephone).  All issues noted in this document were discussed and addressed.  No physical exam could be performed with this format.    Evaluation Performed:  Follow-up visit  Date:  12/09/2018   ID:  Patty [name], DOB [birth year], MRN [id number]  Patient Location: Home Provider Location: Office  Location of Patient: Home Location of Provider: Telehealth Consent was obtain for visit to be over via telehealth. I verified that I am speaking with the correct person using two identifiers.  PCP:  Perlie Mayo, NP   Chief Complaint:    History of Present Illness:    Patty [name] is a 53 y.o. female with history of DDD, asthma, hypertension, hyperlipidemia, obesity seizures, stroke.  Reports today for follow-up on her blood pressure, hyperlipidemia, seizures, obesity.  Here for follow-up of hypertension. She is walking daily.    She reports eating a adherent to a low-salt diet.   Cardiac symptoms: none. Patient denies: chest pain, chest pressure/discomfort, claudication, dyspnea, exertional chest pressure/discomfort, fatigue, irregular heart beat, lower extremity edema, near-syncope, orthopnea, palpitations, paroxysmal nocturnal dyspnea, syncope and tachypnea. Cardiovascular risk factors: none. Use of agents associated with hypertension: none.   Reports taking all medications as directed and denies side  effects.  Reports that she tries to eat a heart healthy low-fat diet.  Reports that she been eating a lot of salads and does not know why she has not been losing weight.  She is unsure what she weighs right now.  She has been consistent in her weight over the last several office visits.  Additionally she reports that she walks daily.  But she is having trouble with that because her legs and knees keep giving her trouble.  Is being seen by Dr. Aline Brochure for knee effusion.  Fluid was drawn off.  She is to follow-up with him regarding any treatment needs in the future.  She has an appointment for her seizures with a neurologist this month.  They will be taking over her medications regarding this as well.  Reports that she is taking all her medications as she supposed urine as previously directed.   The patient does not have symptoms concerning for COVID-19 infection (fever, chills, cough, or new shortness of breath).   Past Medical, Surgical, Social History, Allergies, and Medications have been Reviewed.   Past Medical History:  Diagnosis Date  . Asthma   . DDD (degenerative disc disease), lumbar   . Depression   . HTN (hypertension)   . Hyperlipidemia   . Seizures ([place])    remote  . Stroke [facility]) 1990   chronic right hemiparesis   Past Surgical History:  Procedure Laterality Date  . CESAREAN SECTION    . CHOLECYSTECTOMY N/A 07/13/2014   Procedure: LAPAROSCOPIC CONVERTED TO OPEN  CHOLECYSTECTOMY ;  Surgeon: Armandina Gemma, MD;  [facility];  Service: General;  Laterality: N/A;  . COLONOSCOPY  12/24/2011   Procedure:  COLONOSCOPY;  Surgeon: West BaliSandi L Fields, MD;  [facility];  Service: Endoscopy;  Laterality: N/A;  2:00  . [place]  . PARTIAL HYSTERECTOMY     heavy bleeding     No outpatient medications have been marked as taking for the 12/09/18 encounter (Appointment) with Freddy FinnerMills,  M, NP.     Allergies:   Patient has no known  allergies.   Social History   Tobacco Use  . Smoking status: Never Smoker  . Smokeless tobacco: Never Used  Substance Use Topics  . Alcohol use: Yes    Alcohol/week: 2.0 standard drinks    Types: 2 Standard drinks or equivalent per week    Comment: couple of drinks on weekends  . Drug use: No     Family Hx: The patient's family history includes Diabetes in an other family member; Stroke in her father. There is no history of Colon cancer or Liver disease.  ROS:   Please see the history of present illness.    All other systems reviewed and are negative.   Labs/Other Tests and Data Reviewed:     Recent Labs: 09/08/2018: ALT 16; BUN 14; Creat 0.89; Hemoglobin 14.2; Platelets 265; Potassium 3.5; Sodium 142; TSH 1.37   Recent Lipid Panel Lab Results  Component Value Date/Time   CHOL 170 09/09/2018 07:50 AM   TRIG 120 09/09/2018 07:50 AM   HDL 47 (L) 09/09/2018 07:50 AM   CHOLHDL 3.6 09/09/2018 07:50 AM   LDLCALC 101 (H) 09/09/2018 07:50 AM    Wt Readings from Last 3 Encounters:  10/28/18 221 lb (100.2 kg)  10/07/18 221 lb 1.9 oz (100.3 kg)  09/08/18 222 lb 0.6 oz (100.7 kg)     Objective:    Vital Signs:  There were no vitals taken for this visit.   GEN:  alert and oriented RESPIRATORY:  no shortness of breath in conversation PSYCH:  normal affect and mood  ASSESSMENT & PLAN:    1. Morbid obesity due to excess calories (HCC) Patty [name] is re-educated about the importance of exercise daily to help with weight management. A minumum of 30 minutes daily is recommended. Additionally, importance of healthy food choices  with portion control discussed.   Wt Readings from Last 3 Encounters:  10/28/18 221 lb (100.2 kg)  10/07/18 221 lb 1.9 oz (100.3 kg)  09/08/18 222 lb 0.6 oz (100.7 kg)     2. Mixed hyperlipidemia Educated about heart healthy diet including low-fat foods.  And avoidance of fried foods.  3. Seizure disorder [facility](HCC) Has follow-up with neurologist  this month.  Appreciate collaboration in her care.  If you need anything from PCP please just reach out.   4. Essential hypertension Telephone visit today.  Demonstrated control over previous office visits.  Will continue current medication regime.  Currently advised to make sure she stays away from extra salt and unhealthy foods such as fried foods and fast foods.   Time:   Today, I have spent 10 minutes with the patient with telehealth technology discussing the above problems.     Medication Adjustments/Labs and Tests Ordered: Current medicines are reviewed at length with the patient today.  Concerns regarding medicines are outlined above.   Tests Ordered: No orders of the defined types were placed in this encounter.   Medication Changes: No orders of the defined types were placed in this encounter.   Disposition:  Follow up 4 months and needs flu shot  Signed, Freddy Finner, NP  12/09/2018 3:48 PM     [facility]  \

## 2018-12-18 ENCOUNTER — Other Ambulatory Visit: Payer: Self-pay

## 2018-12-18 ENCOUNTER — Ambulatory Visit (HOSPITAL_COMMUNITY)
Admission: RE | Admit: 2018-12-18 | Discharge: 2018-12-18 | Disposition: A | Discharge status: Home / Self Care | Payer: Medicare Other | Source: Ambulatory Visit | Attending: Family Medicine | Admitting: Family Medicine

## 2018-12-18 DIAGNOSIS — Z1231 Encounter for screening mammogram for malignant neoplasm of breast: Secondary | ICD-10-CM | POA: Diagnosis not present

## 2018-12-22 NOTE — Progress Notes (Signed)
Mammogram clear, recheck 1 year.

## 2018-12-28 ENCOUNTER — Encounter

## 2018-12-28 ENCOUNTER — Encounter: Payer: Self-pay | Admitting: Neurology

## 2018-12-28 ENCOUNTER — Ambulatory Visit (INDEPENDENT_AMBULATORY_CARE_PROVIDER_SITE_OTHER): Payer: Medicare Other | Admitting: Neurology

## 2018-12-28 ENCOUNTER — Ambulatory Visit (INDEPENDENT_AMBULATORY_CARE_PROVIDER_SITE_OTHER): Payer: Medicare Other | Admitting: Orthopedic Surgery

## 2018-12-28 ENCOUNTER — Other Ambulatory Visit: Payer: Self-pay

## 2018-12-28 VITALS — BP 126/78 | HR 78 | Temp 97.8°F | Ht 62.0 in | Wt 227.0 lb

## 2018-12-28 VITALS — BP 140/72 | HR 86 | Temp 97.1°F | Wt 226.0 lb

## 2018-12-28 DIAGNOSIS — M25461 Effusion, right knee: Secondary | ICD-10-CM | POA: Diagnosis not present

## 2018-12-28 DIAGNOSIS — I6389 Other cerebral infarction: Secondary | ICD-10-CM | POA: Diagnosis not present

## 2018-12-28 DIAGNOSIS — G40909 Epilepsy, unspecified, not intractable, without status epilepticus: Secondary | ICD-10-CM

## 2018-12-28 MED ORDER — DIVALPROEX SODIUM 250 MG PO DR TAB: 250.0000 mg | DELAYED_RELEASE_TABLET | Freq: Three times a day (TID) | ORAL | 3 refills | Status: DC

## 2018-12-28 NOTE — Progress Notes (Signed)
Progress Note   Patient ID: Patty [name], female   DOB: February 16, 1966, 53 y.o.   MRN: [id number]   Chief Complaint  Patient presents with  . Follow-up    Recheck on right knee.    Encounter Diagnosis  Name Primary?  . Effusion, right knee Yes    53 year old female with a chronic right knee effusion had aspiration injection with good relief last time but the effusion came back she complains of increased pain when she is ambulating walking or increases her activity    Review of Systems  Neurological:       Right sided upper extremity and lower extremity weakness from a prior CVA      BP 126/78   Pulse 78   Temp 97.8 F (36.6 C)   Ht 5\' 2"  (1.575 m)   Wt 227 lb (103 kg)   BMI 41.52 kg/m   Physical Exam Right knee has an effusion tenderness on the medial joint line she is 125 degrees of flexion painful knee extension with 5 degrees loss of extension knee feels stable medial joint line is tender muscle strength shows mild weakness but intact extensor mechanism the skin is warm dry and intact without rash lesion ulceration or erythema she has a good distal pulse  Medical decisions:  (Established problem worse, x-ray ,physical therapy, over-the-counter medicines, read outside film or summarize x-ray)  Data  Imaging:   Prior x-ray shows effusion arthritis mild  Encounter Diagnosis  Name Primary?  . Effusion, right knee Yes    PLAN:   Repeat aspiration injection right knee  MRI right knee chronic knee effusion  Procedure note injection and aspiration right knee joint  Verbal consent was obtained to aspirate and inject the right knee joint   Timeout was completed to confirm the site of aspiration and injection  An 18-gauge needle was used to aspirate the knee joint from a suprapatellar lateral approach.  The medications used were 40 mg of Depo-Medrol and 1% lidocaine 3 cc  Anesthesia was provided by ethyl chloride and the skin was prepped with alcohol.  After  cleaning the skin with alcohol an 18-gauge needle was used to aspirate the right knee joint.  We obtained 5  cc of fluid clear   We follow this by injection of 40 mg of Depo-Medrol and 3 cc 1% lidocaine.  There were no complications. A sterile bandage was applied.   Arther Abbott, MD 12/28/2018 12:03 PM

## 2018-12-28 NOTE — Progress Notes (Signed)
[facility]    Provider:  Dr Jaynee Eagles Requesting Provider: Perlie Mayo, NP Primary Care Provider:  Perlie Mayo, NP  CC:  seizures  HPI:  Patty [name] is a 53 y.o. female here as requested by Perlie Mayo, NP for seizures.  Past medical history remote stroke, seizures, hyperlipidemia, hyperglycemia, morbid obesity, asthma, hypertension, depression, degenerative disc disease stroke in 1990. It was here in the [facility] system, she doesn't remember. She never had seizures before. Very poor historian. She has been on the Depakote since then. She has done well on the Depakote, no problems and no seizures. She wants to continue it. She has no idea. She tried to go without the medication for a few days and had side effects. Sheis supposed to be taking calcium and vitamin D. Asa 81mg  for stroke prevention. She has some weakness on the right side. She has numbness. No other focal neurologic deficits, associated symptoms, inciting events or modifiable factors.  I reviewed CT of the head report from 2006.  The images were not available to me.  Generalized cerebral and cerebellar atrophic changes, ectatic dilatation of the left parietal sulcus, And ipsilateral dilatation of the left lateral ventricle, this may be result of chronic ischemic change, no acute intracranial abnormality.  Ipsilateral dilatation of the left lateral ventricle.  This may be a result of chronic ischemic changes.  Nothing acute.    Valproic acid level 42.7  Review of Systems: Patient complains of symptoms per HPI as well as the following symptoms: no current symptoms. Pertinent negatives and positives per HPI. All others negative.   Social History   Socioeconomic History  . Marital status: Divorced    Spouse name: Not on file  . Number of children: 2  . Years of education: Not on file  . Highest education level: Not on file  Occupational History  . Occupation: disabled    Fish farm manager: UNEMPLOYED  Social  Needs  . Financial resource strain: Not hard at all  . Food insecurity    Worry: Never true    Inability: Never true  . Transportation needs    Medical: No    Non-medical: No  Tobacco Use  . Smoking status: Never Smoker  . Smokeless tobacco: Never Used  Substance and Sexual Activity  . Alcohol use: Yes    Alcohol/week: 2.0 standard drinks    Types: 2 Standard drinks or equivalent per week    Comment: couple of drinks on weekends  . Drug use: No  . Sexual activity: Not Currently  Lifestyle  . Physical activity    Days per week: 0 days    Minutes per session: 0 min  . Stress: To some extent  Relationships  . Social Herbalist on phone: Once a week    Gets together: Once a week    Attends religious service: 1 to 4 times per year    Active member of club or organization: No    Attends meetings of clubs or organizations: Never    Relationship status: Divorced  . Intimate partner violence    Fear of current or ex partner: No    Emotionally abused: No    Physically abused: No    Forced sexual activity: No  Other Topics Concern  . Not on file  Social History Narrative   On disability. Used to work at [employer]/Worked at [employer] Retired now   [employer]    Two children.    Daughter:  24    Son: 34   Has seven grand children.    Enjoys reading      Diet: eats lots of fruits and veggies, occasional meat: chicken mostly, likes pasta    Caffeine: Tea-green lipton   Water: about 5 cups daily      Wears seatbelt   Smoke detectors       Family History  Problem Relation Age of Onset  . Stroke Father   . Diabetes Other   . Colon cancer Neg Hx   . Liver disease Neg Hx     Past Medical History:  Diagnosis Date  . Asthma   . DDD (degenerative disc disease), lumbar   . Depression   . HTN (hypertension)   . Hyperlipidemia   . Seizures (HCC)    remote  . Stroke [facility]) 1990   chronic right hemiparesis    Patient Active Problem List   Diagnosis  Date Noted  . Mixed hyperlipidemia 12/09/2018  . Morbid obesity due to excess calories (HCC) 12/09/2018  . High risk medication use 04/25/2017  . Asthma exacerbation 10/18/2014  . Hyperglycemia 10/18/2014  . Acute respiratory failure with hypoxia (HCC) 10/18/2014  . Essential hypertension 10/18/2014  . Seizure disorder (HCC) 10/18/2014  . Acute cholecystitis 07/14/2014  . Cholelithiasis with acute cholecystitis 07/13/2014  . Cholecystitis with cholelithiasis 07/12/2014  . Rectal bleed 11/13/2011  . Left flank pain 11/13/2011  . Stroke [facility]) 1990    Past Surgical History:  Procedure Laterality Date  . CESAREAN SECTION    . CHOLECYSTECTOMY N/A 07/13/2014   Procedure: LAPAROSCOPIC CONVERTED TO OPEN  CHOLECYSTECTOMY ;  Surgeon: Darnell Level, MD;  Location: [facility] ORS;  Service: General;  Laterality: N/A;  . COLONOSCOPY  12/24/2011   Procedure: COLONOSCOPY;  Surgeon: West Bali, MD;  Location: [facility] SUITE;  Service: Endoscopy;  Laterality: N/A;  2:00  . GALLBLADDER SURGERY     [name] /[place]  . PARTIAL HYSTERECTOMY     heavy bleeding    Current Outpatient Medications  Medication Sig Dispense Refill  . aspirin EC 81 MG tablet Take 1 tablet (81 mg total) by mouth daily.    . diclofenac sodium (VOLTAREN) 1 % GEL Apply 1 application topically 2 (two) times a day.    . divalproex (DEPAKOTE) 250 MG DR tablet Take 1 tablet (250 mg total) by mouth 3 (three) times daily. 270 tablet 3  . escitalopram (LEXAPRO) 10 MG tablet TAKE 1 TABLET(10 MG) BY MOUTH DAILY 90 tablet 1  . meloxicam (MOBIC) 7.5 MG tablet Take 1 tablet (7.5 mg total) by mouth daily. 30 tablet 5  . oxybutynin (DITROPAN-XL) 5 MG 24 hr tablet Take 5 mg by mouth daily.    . pravastatin (PRAVACHOL) 20 MG tablet TAKE 1 TABLET BY MOUTH EVERY DAY 30 tablet 0  . SYMBICORT 80-4.5 MCG/ACT inhaler INHALE 2 PUFFS BY MOUTH EVERY 12 HOURS 10.2 g 2  . verapamil (CALAN) 120 MG tablet Take 1 tablet (120 mg total) by mouth daily. 90  tablet 1  . XOPENEX HFA 45 MCG/ACT inhaler INHALE 1 TO 2 PUFFS BY MOUTH EVERY 4 TO 6 HOURS AS NEEDED FOR WHEEZING 15 g 0   No current facility-administered medications for this visit.     Allergies as of 12/28/2018  . (No Known Allergies)    Vitals: BP 140/72   Pulse 86   Temp (!) 97.1 F (36.2 C)   Wt 226 lb (102.5 kg)   BMI 41.34 kg/m  Last  Weight:  Wt Readings from Last 1 Encounters:  12/28/18 226 lb (102.5 kg)   Last Height:   Ht Readings from Last 1 Encounters:  12/28/18  (1.575 m)     Physical exam: Exam: Gen: NAD, obese            CV: RRR, no MRG. No Carotid Bruits.   peripheral edema, warm, nontender Eyes: Conjunctivae clear without exudates or hemorrhage  Neuro: Detailed Neurologic Exam  Speech:    Speech is normal; fluent.  Cognition:    The patient is oriented to person, place, and time;     recent and remote memory impaired;     language fluent;     Impaired attention, concentration, fund of knowledge Cranial Nerves:    The pupils are equal, round, and reactive to light.  Attempted funduscopy could not visualize due to small pupils.  Visual fields are full to finger confrontation. Extraocular movements are intact. Trigeminal sensation is intact and the muscles of mastication are normal. The face is symmetric. The palate elevates in the midline. Hearing intact. Voice is normal. Shoulder shrug is normal. The tongue has normal motion without fasciculations.   Coordination:    No dysmetria.   Gait:  Wide based and lordotic.  Motor Observation:  no involuntary movements noted. Tone: Increased tone right foot and ankle  Posture:    Posture is lordotic possibly due to obesity    Strength:    Poor effort, right hemiparesis 4/5     Sensation: decreased right      Reflex Exam:  DTR's:    Stiffness distally in the right foot no AJ, brisk otherwise maybe alittle more so on the right   Toes:    The toes are equiv bilaterally.   Clonus:     Clonus is absent.  Assessment/Plan:  53 y.o. female here as requested by Freddy Finner, NP for seizures.  Past medical history remote stroke, seizures, hyperlipidemia, hyperglycemia, morbid obesity, asthma, hypertension, depression, degenerative disc disease stroke in 1990.  Patient is a very poor historian, she says she had a stroke in 1990 and then had seizures, she does not remember what kind of seizures will give me any kind of description, how many she had, how long they lasted  although she endorses that she has been doing great on the Depakote for many years.  She says she tried to come off the Depakote once and had problems which she could not elaborate on.   -I could not find any records regarding her stroke or seizures.  Patient is an incredibly poor historian.  I think at this point given past stroke followed by seizure, we can assume that the stroke is the seizure focus.  She has been doing very well on Depakote for many years, she does not wish to stop it.  Her level is slightly subtherapeutic however I would not change anything, since she is doing so well and not had any seizures I would just recommend keeping her on it for life.  She does not want to stop it, said when she tried to she had problems although could not elaborate.  -She can return back to primary care since she has been stable on Depakote for decades.  I would keep her on the same dose.  I would only change management if she had another seizure.  -I discussed risk of seizures, safety concerns, if she were to have another seizure she cannot drive for 6 to 12 months per  [place] law.  - Discussed: Continue ASAfor secondary stroke prevention and maintain strict control of hypertension with blood pressure goal below 130/90, diabetes with hemoglobin A1c goal below 6.5% and lipids with LDL cholesterol goal below 70 mg/dL. I also advised the patient to eat a healthy diet with plenty of whole grains, cereals, fruits and  vegetables, exercise regularly and maintain ideal body weight    Meds ordered this encounter  Medications  . divalproex (DEPAKOTE) 250 MG DR tablet    Sig: Take 1 tablet (250 mg total) by mouth 3 (three) times daily.    Dispense:  270 tablet    Refill:  3    Cc: Freddy FinnerMills, Hannah M, NP,   A total of 30  minutes was spent face-to-face with this patient. Over half this time was spent on counseling patient on the  1. Seizure disorder (HCC)   2. Morbid obesity (HCC)   3. Cerebrovascular accident (CVA) due to other mechanism [facility](HCC)    diagnosis and different diagnostic and therapeutic options, counseling and coordination of care, risks ans benefits of management, compliance, or risk factor reduction and education.     Naomie DeanAntonia Safiatou Islam, MD  [facility] [address] [place], [place] [id number]  Phone [phone] Fax [phone]

## 2018-12-28 NOTE — Addendum Note (Signed)
Addended byCandice Camp on: 12/28/2018 12:09 PM   Modules accepted: Orders

## 2018-12-28 NOTE — Patient Instructions (Signed)
We will obtain pre-certification from the insurer and call you to schedule the study. Dr Harrison will call you with the results    

## 2018-12-28 NOTE — Patient Instructions (Signed)
Valproic Acid, Divalproex Sodium delayed or extended-release tablets What is this medicine? DIVALPROEX SODIUM (dye VAL pro ex SO dee um) is used to prevent seizures caused by some forms of epilepsy. It is also used to treat bipolar mania and to prevent migraine headaches. This medicine may be used for other purposes; ask your health care provider or pharmacist if you have questions. COMMON BRAND NAME(S): Depakote, Depakote ER What should I tell my health care provider before I take this medicine? They need to know if you have any of these conditions:  if you often drink alcohol  kidney disease  liver disease  low platelet counts  mitochondrial disease  suicidal thoughts, plans, or attempt; a previous suicide attempt by you or a family member  urea cycle disorder (UCD)  an unusual or allergic reaction to divalproex sodium, sodium valproate, valproic acid, other medicines, foods, dyes, or preservatives  pregnant or trying to get pregnant  breast-feeding How should I use this medicine? Take this medicine by mouth with a drink of water. Follow the directions on the prescription label. Do not cut, crush or chew this medicine. You can take it with or without food. If it upsets your stomach, take it with food. Take your medicine at regular intervals. Do not take it more often than directed. Do not stop taking except on your doctor's advice. A special MedGuide will be given to you by the pharmacist with each prescription and refill. Be sure to read this information carefully each time. Talk to your pediatrician regarding the use of this medicine in children. While this drug may be prescribed for children as young as 10 years for selected conditions, precautions do apply. Overdosage: If you think you have taken too much of this medicine contact a poison control center or emergency room at once. NOTE: This medicine is only for you. Do not share this medicine with others. What if I miss a  dose? If you miss a dose, take it as soon as you can. If it is almost time for your next dose, take only that dose. Do not take double or extra doses. What may interact with this medicine? Do not take this medicine with any of the following medications:  sodium phenylbutyrate This medicine may also interact with the following medications:  aspirin  certain antibiotics like ertapenem, imipenem, meropenem  certain medicines for depression, anxiety, or psychotic disturbances  certain medicines for seizures like carbamazepine, clonazepam, diazepam, ethosuximide, felbamate, lamotrigine, phenobarbital, phenytoin, primidone, rufinamide, topiramate  certain medicines that treat or prevent blood clots like warfarin  cholestyramine  female hormones, like estrogens and birth control pills, patches, or rings  propofol  rifampin  ritonavir  tolbutamide  zidovudine This list may not describe all possible interactions. Give your health care provider a list of all the medicines, herbs, non-prescription drugs, or dietary supplements you use. Also tell them if you smoke, drink alcohol, or use illegal drugs. Some items may interact with your medicine. What should I watch for while using this medicine? Tell your doctor or health care provider if your symptoms do not get better or they start to get worse. This medicine may cause serious skin reactions. They can happen weeks to months after starting the medicine. Contact your health care provider right away if you notice fevers or flu-like symptoms with a rash. The rash may be red or purple and then turn into blisters or peeling of the skin. Or, you might notice a red rash with swelling of the   face, lips or lymph nodes in your neck or under your arms. Wear a medical ID bracelet or chain, and carry a card that describes your disease and details of your medicine and dosage times. You may get drowsy, dizzy, or have blurred vision. Do not drive, use  machinery, or do anything that needs mental alertness until you know how this medicine affects you. To reduce dizzy or fainting spells, do not sit or stand up quickly, especially if you are an older patient. Alcohol can increase drowsiness and dizziness. Avoid alcoholic drinks. This medicine can make you more sensitive to the sun. Keep out of the sun. If you cannot avoid being in the sun, wear protective clothing and use sunscreen. Do not use sun lamps or tanning beds/booths. Patients and their families should watch out for new or worsening depression or thoughts of suicide. Also watch out for sudden changes in feelings such as feeling anxious, agitated, panicky, irritable, hostile, aggressive, impulsive, severely restless, overly excited and hyperactive, or not being able to sleep. If this happens, especially at the beginning of treatment or after a change in dose, call your health care provider. Women should inform their doctor if they wish to become pregnant or think they might be pregnant. There is a potential for serious side effects to an unborn child. Talk to your health care provider or pharmacist for more information. Women who become pregnant while using this medicine may enroll in the [place] American Antiepileptic Drug Pregnancy Registry by calling 1-888-233-2334. This registry collects information about the safety of antiepileptic drug use during pregnancy. This medicine may cause a decrease in folic acid and vitamin D. You should make sure that you get enough vitamins while you are taking this medicine. Discuss the foods you eat and the vitamins you take with your health care provider. What side effects may I notice from receiving this medicine? Side effects that you should report to your doctor or health care professional as soon as possible:  allergic reactions like skin rash, itching or hives, swelling of the face, lips, or tongue  changes in vision  rash, fever, and swollen lymph  nodes  redness, blistering, peeling or loosening of the skin, including inside the mouth  signs and symptoms of liver injury like dark yellow or brown urine; general ill feeling or flu-like symptoms; light-colored stools; loss of appetite; nausea; right upper belly pain; unusually weak or tired; yellowing of the eyes or skin  suicidal thoughts or other mood changes  unusual bleeding or bruising Side effects that usually do not require medical attention (report to your doctor or health care professional if they continue or are bothersome):  constipation  diarrhea  dizziness  hair loss  headache  loss of appetite  weight gain This list may not describe all possible side effects. Call your doctor for medical advice about side effects. You may report side effects to FDA at 1-800-FDA-1088. Where should I keep my medicine? Keep out of reach of children. Store at room temperature between 15 and 30 degrees C (59 and 86 degrees F). Keep container tightly closed. Throw away any unused medicine after the expiration date. NOTE: This sheet is a summary. It may not cover all possible information. If you have questions about this medicine, talk to your doctor, pharmacist, or health care provider.  2020 Elsevier/Gold Standard (2018-06-26 16:03:42)  

## 2018-12-29 ENCOUNTER — Encounter: Payer: Self-pay | Admitting: Neurology

## 2019-01-21 ENCOUNTER — Ambulatory Visit
Admission: RE | Admit: 2019-01-21 | Discharge: 2019-01-21 | Disposition: A | Discharge status: Home / Self Care | Payer: Medicare Other | Source: Ambulatory Visit | Attending: Orthopedic Surgery | Admitting: Orthopedic Surgery

## 2019-01-21 ENCOUNTER — Other Ambulatory Visit: Payer: Self-pay

## 2019-01-21 DIAGNOSIS — M25561 Pain in right knee: Secondary | ICD-10-CM | POA: Diagnosis not present

## 2019-01-21 DIAGNOSIS — M25461 Effusion, right knee: Secondary | ICD-10-CM

## 2019-04-29 ENCOUNTER — Ambulatory Visit: Payer: Medicare Other | Admitting: Family Medicine

## 2019-04-30 ENCOUNTER — Ambulatory Visit: Payer: Medicare Other | Admitting: Family Medicine

## 2019-05-04 ENCOUNTER — Ambulatory Visit: Payer: Medicare Other | Admitting: Family Medicine

## 2019-05-04 ENCOUNTER — Other Ambulatory Visit: Payer: Self-pay

## 2019-05-04 ENCOUNTER — Ambulatory Visit: Payer: Medicare Other

## 2019-05-04 ENCOUNTER — Other Ambulatory Visit (INDEPENDENT_AMBULATORY_CARE_PROVIDER_SITE_OTHER): Payer: Medicare Other

## 2019-05-04 DIAGNOSIS — R8290 Unspecified abnormal findings in urine: Secondary | ICD-10-CM

## 2019-05-04 DIAGNOSIS — R109 Unspecified abdominal pain: Secondary | ICD-10-CM

## 2019-05-04 LAB — POCT URINALYSIS DIP (CLINITEK)
Bilirubin, UA: NEGATIVE
Blood, UA: NEGATIVE
Glucose, UA: NEGATIVE mg/dL
Ketones, POC UA: NEGATIVE mg/dL
Leukocytes, UA: NEGATIVE
Nitrite, UA: NEGATIVE
POC PROTEIN,UA: NEGATIVE
Spec Grav, UA: 1.02 (ref 1.010–1.025)
Urobilinogen, UA: 1 E.U./dL
pH, UA: 6 (ref 5.0–8.0)

## 2019-05-05 ENCOUNTER — Ambulatory Visit: Payer: Medicare Other | Admitting: Family Medicine

## 2019-07-20 NOTE — Progress Notes (Signed)
Chief Complaint  Patient presents with  . Follow-up    Recheck on right knee.    Encounter Diagnoses  Name Primary?  . Effusion, right knee Yes  . Body mass index 40.0-44.9, adult (HCC)   . Morbid obesity (HCC)   . Chronic pain of right knee    Prior visits: MRI  IMPRESSION: 1. Small horizontal grade 3 tear of the posterior horn lateral meniscus near the midbody. Mild lateral meniscal extrusion. 2. Mild to moderate degrees of degenerative chondral thinning in the knee joint. 3. Small knee effusion and small Baker's cyst.     Electronically Signed   By: Gaylyn Rong M.D.   On: 01/22/2019 08:18 _______________________________________________________________________  BP 132/84   Pulse 74   Temp (!) 97.1 F (36.2 C)   Ht 5\' 2"  (1.575 m)   Wt 226 lb (102.5 kg)   BMI 41.34 kg/m   The patient meets the AMA guidelines for Morbid (severe) obesity with a BMI > 40.0 and I have recommended weight loss.  The patient meets the AMA guidelines for Morbid (severe) obesity with a BMI > 40.0 and I have recommended weight loss.   Patty [name] complains of pain medial and lateral anterior joint lines.  She had an MRI as above she had a horizontal grade 3 tear the posterior horn lateral meniscus with arthritis in all 3 compartments  She says her knee gives out on her sometimes although she is had a stroke and requires a cane for ambulation  The knee has no effusion tender on the medial femoral condyle posterior medial joint line and mildly over the lateral femoral condyle  She moves her knee normally in terms of motion and feels stable detect any meniscal signs  Recommend straight cortisone injection she is agreeable  Procedure note  Procedure note right knee injection   verbal consent was obtained to inject right knee joint  Timeout was completed to confirm the site of injection  The medications used were 40 mg of Depo-Medrol and 1% lidocaine 3 cc  Anesthesia was provided  by ethyl chloride and the skin was prepped with alcohol.  After cleaning the skin with alcohol a 20-gauge needle was used to inject the right knee joint. There were no complications. A sterile bandage was applied.   She will continue meloxicam and see Loraine Leriche in 3 months  Encounter Diagnoses  Name Primary?  . Effusion, right knee Yes  . Body mass index 40.0-44.9, adult (HCC)   . Morbid obesity (HCC)   . Chronic pain of right knee

## 2019-07-21 ENCOUNTER — Ambulatory Visit (INDEPENDENT_AMBULATORY_CARE_PROVIDER_SITE_OTHER): Payer: Medicare Other | Admitting: Orthopedic Surgery

## 2019-07-21 ENCOUNTER — Other Ambulatory Visit: Payer: Self-pay

## 2019-07-21 VITALS — BP 132/84 | HR 74 | Temp 97.1°F | Ht 62.0 in | Wt 226.0 lb

## 2019-07-21 DIAGNOSIS — Z6841 Body Mass Index (BMI) 40.0 and over, adult: Secondary | ICD-10-CM | POA: Diagnosis not present

## 2019-07-21 DIAGNOSIS — G8929 Other chronic pain: Secondary | ICD-10-CM

## 2019-07-21 DIAGNOSIS — M25461 Effusion, right knee: Secondary | ICD-10-CM

## 2019-07-21 DIAGNOSIS — M25561 Pain in right knee: Secondary | ICD-10-CM | POA: Diagnosis not present

## 2019-07-21 NOTE — Patient Instructions (Signed)

## 2019-08-31 ENCOUNTER — Ambulatory Visit (INDEPENDENT_AMBULATORY_CARE_PROVIDER_SITE_OTHER): Payer: Medicare Other | Admitting: Family Medicine

## 2019-08-31 ENCOUNTER — Encounter: Payer: Self-pay | Admitting: Family Medicine

## 2019-08-31 ENCOUNTER — Other Ambulatory Visit: Payer: Self-pay

## 2019-08-31 DIAGNOSIS — J4541 Moderate persistent asthma with (acute) exacerbation: Secondary | ICD-10-CM | POA: Insufficient documentation

## 2019-08-31 DIAGNOSIS — J4540 Moderate persistent asthma, uncomplicated: Secondary | ICD-10-CM | POA: Diagnosis not present

## 2019-08-31 MED ORDER — ALBUTEROL SULFATE HFA 108 (90 BASE) MCG/ACT IN AERS: 1.0000 | INHALATION_SPRAY | Freq: Four times a day (QID) | RESPIRATORY_TRACT | 1 refills | Status: DC | PRN

## 2019-08-31 MED ORDER — BUDESONIDE-FORMOTEROL FUMARATE 80-4.5 MCG/ACT IN AERO: INHALATION_SPRAY | RESPIRATORY_TRACT | 2 refills | Status: DC

## 2019-08-31 MED ORDER — ALBUTEROL SULFATE 0.63 MG/3ML IN NEBU: 1.0000 | INHALATION_SOLUTION | Freq: Four times a day (QID) | RESPIRATORY_TRACT | 3 refills | Status: DC | PRN

## 2019-08-31 MED ORDER — PRAVASTATIN SODIUM 20 MG PO TABS: 20.0000 mg | ORAL_TABLET | Freq: Every day | ORAL | 0 refills | Status: DC

## 2019-08-31 MED ORDER — MONTELUKAST SODIUM 10 MG PO TABS: 10.0000 mg | ORAL_TABLET | Freq: Every day | ORAL | 1 refills | Status: DC

## 2019-08-31 NOTE — Progress Notes (Signed)
Subjective:  Patient ID: Patty [name], female    DOB: 1965-05-08  Age: 54 y.o. MRN: [id number]  CC:  Chief Complaint  Patient presents with  . Follow-up    has nebulizer machine but does not have any neb medication for machine would much rather use this than inahler       HPI  HPI   Patty [name] is a 54 year old female patient of mine.  She presents today to get refills of her nebulizer medication I have not refilled her nebulizer medication as she did not need any prior to meeting me last year.  She has a history of having asthma.  Reports that she has had a flareup of shortness of breath and inability to catch her "air" over the last couple weeks.  She thinks it might be from allergies that are uncontrolled.  She reports that she has not been taking her Singulair in some time.  She denies having any exposure to Covid that she is aware of.  Denies having any fevers, chills, or headaches.  Denies fall to floor, trips some normally uses cane however she left it when coming out of the house.. Denies changes in memory  Sleeping ok. Denies trouble chewing, swallowing, or appetite. Denies Bowel and bladder issues or changes, no blood in stool or urine. Denies skin issues or rashes.  Today patient denies signs and symptoms of COVID 19 infection including fever, chills, cough, shortness of breath, and headache. Past Medical, Surgical, Social History, Allergies, and Medications have been Reviewed.   Past Medical History:  Diagnosis Date  . Asthma   . DDD (degenerative disc disease), lumbar   . Depression   . HTN (hypertension)   . Hyperlipidemia   . Seizures (HCC)    remote  . Stroke [facility]) 1990   chronic right hemiparesis    Current Meds  Medication Sig  . aspirin EC 81 MG tablet Take 1 tablet (81 mg total) by mouth daily.  . budesonide-formoterol (SYMBICORT) 80-4.5 MCG/ACT inhaler INHALE 2 PUFFS BY MOUTH EVERY 12 HOURS  . diclofenac sodium (VOLTAREN) 1 % GEL Apply 1  application topically 2 (two) times a day.  . divalproex (DEPAKOTE) 250 MG DR tablet Take 1 tablet (250 mg total) by mouth 3 (three) times daily.  [name] escitalopram (LEXAPRO) 10 MG tablet TAKE 1 TABLET(10 MG) BY MOUTH DAILY  . meloxicam (MOBIC) 7.5 MG tablet Take 1 tablet (7.5 mg total) by mouth daily.  [name] oxybutynin (DITROPAN-XL) 5 MG 24 hr tablet Take 5 mg by mouth daily.  . pravastatin (PRAVACHOL) 20 MG tablet Take 1 tablet (20 mg total) by mouth daily.  . verapamil (CALAN) 120 MG tablet Take 1 tablet (120 mg total) by mouth daily.  [name] HFA 45 MCG/ACT inhaler INHALE 1 TO 2 PUFFS BY MOUTH EVERY 4 TO 6 HOURS AS NEEDED FOR WHEEZING  . [DISCONTINUED] pravastatin (PRAVACHOL) 20 MG tablet TAKE 1 TABLET BY MOUTH EVERY DAY  . [DISCONTINUED] SYMBICORT 80-4.5 MCG/ACT inhaler INHALE 2 PUFFS BY MOUTH EVERY 12 HOURS    ROS:  Review of Systems  Constitutional: Negative.   HENT: Negative.   Eyes: Negative.   Respiratory: Negative.   Cardiovascular: Negative.   Gastrointestinal: Negative.   Genitourinary: Negative.   Musculoskeletal: Negative.   Skin: Negative.   Neurological: Negative.   Endo/Heme/Allergies: Negative.   Psychiatric/Behavioral: Negative.   All other systems reviewed and are negative.    Objective:   Today's Vitals: BP 118/78 (BP Location: Left  Arm, Patient Position: Sitting, Cuff Size: Normal)   Pulse 81   Temp (!) 97.1 F (36.2 C) (Temporal)   Ht 5\' 2"  (1.575 m)   Wt 209 lb 1.9 oz (94.9 kg)   SpO2 93%   BMI 38.25 kg/m  Vitals with BMI 08/31/2019 07/21/2019 12/28/2018  Height 5\' 2"  5\' 2"  -  Weight 209 lbs 2 oz 226 lbs 226 lbs  BMI 38.24 86.76 72.09  Systolic 470 962 836  Diastolic 78 84 72  Pulse 81 74 86     Physical Exam Vitals and nursing note reviewed.  Constitutional:      Appearance: Normal appearance. She is well-developed and well-groomed. She is morbidly obese.  HENT:     Head: Normocephalic and atraumatic.     Right Ear: External ear normal.      Left Ear: External ear normal.     Mouth/Throat:     Comments: Mask in place Eyes:     General:        Right eye: No discharge.        Left eye: No discharge.     Conjunctiva/sclera: Conjunctivae normal.  Cardiovascular:     Rate and Rhythm: Normal rate and regular rhythm.     Pulses: Normal pulses.     Heart sounds: Normal heart sounds.  Pulmonary:     Effort: Pulmonary effort is normal.     Breath sounds: Normal breath sounds. Decreased air movement present.  Musculoskeletal:        General: Normal range of motion.     Cervical back: Normal range of motion and neck supple.  Skin:    General: Skin is warm.  Neurological:     General: No focal deficit present.     Mental Status: She is alert and oriented to person, place, and time.  Psychiatric:        Attention and Perception: Attention normal.        Mood and Affect: Mood normal.        Speech: Speech normal.        Behavior: Behavior normal. Behavior is cooperative.        Thought Content: Thought content normal.        Cognition and Memory: Cognition normal.        Judgment: Judgment normal.     Assessment   1. Morbid obesity due to excess calories ([place])   2. Moderate persistent asthma without complication     Tests ordered No orders of the defined types were placed in this encounter.    Plan: Please see assessment and plan per problem list above.   Meds ordered this encounter  Medications  . budesonide-formoterol (SYMBICORT) 80-4.5 MCG/ACT inhaler    Sig: INHALE 2 PUFFS BY MOUTH EVERY 12 HOURS    Dispense:  10.2 g    Refill:  2  . pravastatin (PRAVACHOL) 20 MG tablet    Sig: Take 1 tablet (20 mg total) by mouth daily.    Dispense:  30 tablet    Refill:  0  . albuterol (ACCUNEB) 0.63 MG/3ML nebulizer solution    Sig: Take 3 mLs (0.63 mg total) by nebulization every 6 (six) hours as needed for wheezing or shortness of breath.    Dispense:  3 mL    Refill:  3    Order Specific Question:   Supervising  Provider    Answer:   SIMPSON, MARGARET E [[id number]]  . albuterol (VENTOLIN HFA) 108 (90 Base) MCG/ACT inhaler  Sig: Inhale 1-2 puffs into the lungs every 6 (six) hours as needed for wheezing or shortness of breath.    Dispense:  8 g    Refill:  1    Order Specific Question:   Supervising Provider    Answer:   SIMPSON, MARGARET E [[id number]]  . montelukast (SINGULAIR) 10 MG tablet    Sig: Take 1 tablet (10 mg total) by mouth at bedtime.    Dispense:  90 tablet    Refill:  1    Order Specific Question:   Supervising Provider    Answer:   Kerri Perches [[id number]]    Patient to follow-up in 12/01/2019 .  Freddy Finner, NP

## 2019-08-31 NOTE — Assessment & Plan Note (Signed)
She reports she is out of her nebulizer treatment.  And is low on her rescue inhaler.  Refills provided today.  No noted physical exam that warrants any antibiotics and or x-ray.  Overall oxygenation is stable as well.  Continue use of all medications.  Restart singular daily.

## 2019-08-31 NOTE — Patient Instructions (Signed)
I appreciate the opportunity to provide you with care for your health and wellness. Today we discussed: asthma flare  Follow up: 3 months in office   No labs or referrals today  Refills sent to pharmacy  Please continue to practice social distancing to keep you, your family, and our community safe.  If you must go out, please wear a mask and practice good handwashing.  It was a pleasure to see you and I look forward to continuing to work together on your health and well-being. Please do not hesitate to call the office if you need care or have questions about your care.  Have a wonderful day and week. With Gratitude, Tereasa Coop, DNP, AGNP-BC

## 2019-08-31 NOTE — Assessment & Plan Note (Signed)
Improved Obesity is linked to hypertension, hyperlipidemia  Patty [name] is re-educated about the importance of exercise daily to help with weight management. A minumum of 30 minutes daily is recommended. Additionally, importance of healthy food choices  with portion control discussed.  Wt Readings from Last 3 Encounters:  08/31/19 209 lb 1.9 oz (94.9 kg)  07/21/19 226 lb (102.5 kg)  12/28/18 226 lb (102.5 kg)

## 2019-10-20 ENCOUNTER — Other Ambulatory Visit: Payer: Self-pay

## 2019-10-20 ENCOUNTER — Ambulatory Visit (INDEPENDENT_AMBULATORY_CARE_PROVIDER_SITE_OTHER): Payer: Medicare Other | Admitting: Orthopedic Surgery

## 2019-10-20 ENCOUNTER — Encounter: Payer: Self-pay | Admitting: Orthopedic Surgery

## 2019-10-20 VITALS — BP 124/79 | HR 77 | Ht 62.0 in | Wt 210.0 lb

## 2019-10-20 DIAGNOSIS — M25461 Effusion, right knee: Secondary | ICD-10-CM

## 2019-10-20 NOTE — Progress Notes (Signed)
Chief Complaint  Patient presents with  . Knee Pain    Right / injection helped/ feels much better     54 year old female status post injection right knee.  She says her knee feels great.  She has been in several times with effusions in the knee this time however she is doing well she was wondering how long or how often she can get an injection we discussed possibility of every 3 months  Improved follow-up as needed  The patient meets the AMA guidelines for Morbid (severe) obesity with a BMI > 40.0 and I have recommended weight loss.   Encounter Diagnoses  Name Primary?  . Morbid obesity due to excess calories (HCC)   . Morbid obesity (HCC)   . Effusion, right knee Yes

## 2019-11-04 ENCOUNTER — Telehealth: Payer: Self-pay

## 2019-11-05 DIAGNOSIS — Z23 Encounter for immunization: Secondary | ICD-10-CM | POA: Diagnosis not present

## 2019-11-24 ENCOUNTER — Telehealth: Payer: Self-pay

## 2019-11-24 NOTE — Telephone Encounter (Signed)
Spoke with Patty [name]- will mail Parking placard to her  Address confimed

## 2019-12-01 ENCOUNTER — Telehealth (INDEPENDENT_AMBULATORY_CARE_PROVIDER_SITE_OTHER): Payer: Medicare Other | Admitting: Family Medicine

## 2019-12-01 ENCOUNTER — Other Ambulatory Visit: Payer: Self-pay

## 2019-12-01 ENCOUNTER — Encounter: Payer: Self-pay | Admitting: Family Medicine

## 2019-12-01 DIAGNOSIS — I10 Essential (primary) hypertension: Secondary | ICD-10-CM

## 2019-12-01 DIAGNOSIS — G40909 Epilepsy, unspecified, not intractable, without status epilepticus: Secondary | ICD-10-CM | POA: Diagnosis not present

## 2019-12-01 NOTE — Assessment & Plan Note (Signed)
Blood pressure overall is stable.  Encouraged DASH diet and heart healthy diet.  Additionally encouraged 30 minutes of exercise daily as physically tolerated without falls.

## 2019-12-01 NOTE — Patient Instructions (Signed)
I appreciate the opportunity to provide you with care for your health and wellness. Today we discussed: Chronic conditions  Follow up: January for annual visit, morning appointment fasting  No labs or referrals today  Continue all medications as directed.   Please let us know if you are willing to get the flu shot we can help get this to you in the near future.  Please continue to practice social distancing to keep you, your family, and our community safe.  If you must go out, please wear a mask and practice good handwashing.  It was a pleasure to see you and I look forward to continuing to work together on your health and well-being. Please do not hesitate to call the office if you need care or have questions about your care.  Have a wonderful day and week. With Gratitude, Tereasa Coop, DNP, AGNP-BC

## 2019-12-01 NOTE — Assessment & Plan Note (Signed)
Take medication as directed, has follow-up with neurology.  Denies having any recent seizure activity.

## 2019-12-01 NOTE — Progress Notes (Signed)
Virtual Visit via Telephone Note   This visit type was conducted due to national recommendations for restrictions regarding the COVID-19 Pandemic (e.g. social distancing) in an effort to limit this patient's exposure and mitigate transmission in our community.  Due to her co-morbid illnesses, this patient is at least at moderate risk for complications without adequate follow up.  This format is felt to be most appropriate for this patient at this time.  The patient did not have access to video technology/had technical difficulties with video requiring transitioning to audio format only (telephone).  All issues noted in this document were discussed and addressed.  No physical exam could be performed with this format.    Evaluation Performed:  Follow-up visit  Date:  12/01/2019   ID:  Patty [name], DOB Mar 23, 1966, MRN [id number]  Patient Location: Home Provider Location: Office/Clinic  Location of Patient: Home Location of Provider: Telehealth Consent was obtain for visit to be over via telehealth. I verified that I am speaking with the correct person using two identifiers.  PCP:  Freddy Finner, NP   Chief Complaint: Chronic conditions  History of Present Illness:    Patty [name] is a 54 y.o. female with history of asthma, depression, hypertension, hyperlipidemia, seizures.  Overall reports that she is doing very well.  Taking her medications as directed.  Does not have any issues or concerns today.  She reports that she is sleeping well.  Appetite is overall doing good.  She does have some to keep that she is going to seeing a dentist about.  She denies any changes in bowel or bladder habits.  Continues to have a little bit of accidents at times with urine.  Reports overall memory is good.  Is using her cane to help ambulate.  Does stumble some but has not had any falls.  Denies skin issues.  Denies hearing issues.  Reports eyesight good.  She denies having any chest pain, shortness of  breath, fever, chills, cough, leg swelling, palpitations, headaches or dizziness.  Has had one of the Covid vaccines, awaiting the phone call to get the other one..  Would like to wait on her flu shot at this time.  The patient does not have symptoms concerning for COVID-19 infection (fever, chills, cough, or new shortness of breath).   Past Medical, Surgical, Social History, Allergies, and Medications have been Reviewed.  Past Medical History:  Diagnosis Date   Acute respiratory failure with hypoxia (HCC) 10/18/2014   Asthma    Cholecystitis with cholelithiasis 07/12/2014   Cholelithiasis with acute cholecystitis 07/13/2014   DDD (degenerative disc disease), lumbar    Depression    HTN (hypertension)    Hyperlipidemia    Rectal bleed 11/13/2011   Seizures (HCC)    remote   Stroke [facility]) 1990   chronic right hemiparesis   Past Surgical History:  Procedure Laterality Date   CESAREAN SECTION     CHOLECYSTECTOMY N/A 07/13/2014   Procedure: LAPAROSCOPIC CONVERTED TO OPEN  CHOLECYSTECTOMY ;  Surgeon: Darnell Level, MD;  Location: [facility] ORS;  Service: General;  Laterality: N/A;   COLONOSCOPY  12/24/2011   Procedure: COLONOSCOPY;  Surgeon: West Bali, MD;  Location: [facility] SUITE;  Service: Endoscopy;  Laterality: N/A;  2:00   GALLBLADDER SURGERY     [name] /M[place]   PARTIAL HYSTERECTOMY     heavy bleeding     Current Meds  Medication Sig   albuterol (ACCUNEB) 0.63 MG/3ML nebulizer solution  Take 3 mLs (0.63 mg total) by nebulization every 6 (six) hours as needed for wheezing or shortness of breath.   albuterol (VENTOLIN HFA) 108 (90 Base) MCG/ACT inhaler Inhale 1-2 puffs into the lungs every 6 (six) hours as needed for wheezing or shortness of breath.   aspirin EC 81 MG tablet Take 1 tablet (81 mg total) by mouth daily.   budesonide-formoterol (SYMBICORT) 80-4.5 MCG/ACT inhaler INHALE 2 PUFFS BY MOUTH EVERY 12 HOURS   diclofenac sodium (VOLTAREN) 1 % GEL  Apply 1 application topically 2 (two) times a day.   divalproex (DEPAKOTE) 250 MG DR tablet Take 1 tablet (250 mg total) by mouth 3 (three) times daily.   escitalopram (LEXAPRO) 10 MG tablet TAKE 1 TABLET(10 MG) BY MOUTH DAILY   meloxicam (MOBIC) 7.5 MG tablet Take 1 tablet (7.5 mg total) by mouth daily.   montelukast (SINGULAIR) 10 MG tablet Take 1 tablet (10 mg total) by mouth at bedtime.   oxybutynin (DITROPAN-XL) 5 MG 24 hr tablet Take 5 mg by mouth daily.   pravastatin (PRAVACHOL) 20 MG tablet Take 1 tablet (20 mg total) by mouth daily.   verapamil (CALAN) 120 MG tablet Take 1 tablet (120 mg total) by mouth daily.   XOPENEX HFA 45 MCG/ACT inhaler INHALE 1 TO 2 PUFFS BY MOUTH EVERY 4 TO 6 HOURS AS NEEDED FOR WHEEZING     Allergies:   Patient has no known allergies.   ROS:   Please see the history of present illness.    All other systems reviewed and are negative.   Labs/Other Tests and Data Reviewed:    Recent Labs: No results found for requested labs within last 8760 hours.   Recent Lipid Panel Lab Results  Component Value Date/Time   CHOL 170 09/09/2018 07:50 AM   TRIG 120 09/09/2018 07:50 AM   HDL 47 (L) 09/09/2018 07:50 AM   CHOLHDL 3.6 09/09/2018 07:50 AM   LDLCALC 101 (H) 09/09/2018 07:50 AM    Wt Readings from Last 3 Encounters:  12/01/19 210 lb (95.3 kg)  10/20/19 210 lb (95.3 kg)  08/31/19 209 lb 1.9 oz (94.9 kg)     Objective:    Vital Signs:  BP 124/79    Ht 5\' 2"  (1.575 m)    Wt 210 lb (95.3 kg)    BMI 38.41 kg/m    VITAL SIGNS:  reviewed GEN:  Alert and oriented RESPIRATORY:  No shortness of breath noted in conversation PSYCH:  Normal affect and mood  ASSESSMENT & PLAN:     1. Morbid obesity due to excess calories (HCC)   2. Seizure disorder (HCC)   3. Essential hypertension    Time:   Today, I have spent 10 minutes with the patient with telehealth technology discussing the above problems.     Medication Adjustments/Labs and  Tests Ordered: Current medicines are reviewed at length with the patient today.  Concerns regarding medicines are outlined above.   Tests Ordered: No orders of the defined types were placed in this encounter.   Medication Changes: No orders of the defined types were placed in this encounter.   Disposition:  Follow up Jan for annual visit  Signed, Feb, NP  12/01/2019 2:10 PM     01/31/2020 [facility]

## 2019-12-01 NOTE — Assessment & Plan Note (Signed)
Obesity is linked to hyperlipidemia, hypertension, depression  Patty [name] is re-educated about the importance of exercise daily to help with weight management. A minumum of 30 minutes daily is recommended. Additionally, importance of healthy food choices  with portion control discussed.  Wt Readings from Last 3 Encounters:  12/01/19 210 lb (95.3 kg)  10/20/19 210 lb (95.3 kg)  08/31/19 209 lb 1.9 oz (94.9 kg)

## 2019-12-05 DIAGNOSIS — Z23 Encounter for immunization: Secondary | ICD-10-CM | POA: Diagnosis not present

## 2019-12-09 ENCOUNTER — Other Ambulatory Visit (HOSPITAL_COMMUNITY): Payer: Self-pay | Admitting: Family Medicine

## 2019-12-09 DIAGNOSIS — Z1231 Encounter for screening mammogram for malignant neoplasm of breast: Secondary | ICD-10-CM

## 2020-01-17 ENCOUNTER — Ambulatory Visit (HOSPITAL_COMMUNITY): Payer: Medicare Other

## 2020-01-31 ENCOUNTER — Other Ambulatory Visit: Payer: Self-pay

## 2020-01-31 ENCOUNTER — Ambulatory Visit (HOSPITAL_COMMUNITY)
Admission: RE | Admit: 2020-01-31 | Discharge: 2020-01-31 | Disposition: A | Discharge status: Home / Self Care | Payer: Medicare Other | Source: Ambulatory Visit | Attending: Family Medicine | Admitting: Family Medicine

## 2020-01-31 DIAGNOSIS — Z1231 Encounter for screening mammogram for malignant neoplasm of breast: Secondary | ICD-10-CM

## 2020-02-07 ENCOUNTER — Other Ambulatory Visit: Payer: Self-pay

## 2020-02-07 MED ORDER — LEVALBUTEROL TARTRATE 45 MCG/ACT IN AERO: INHALATION_SPRAY | RESPIRATORY_TRACT | 0 refills | Status: DC

## 2020-03-01 ENCOUNTER — Other Ambulatory Visit: Payer: Self-pay | Admitting: Neurology

## 2020-03-01 ENCOUNTER — Other Ambulatory Visit: Payer: Self-pay | Admitting: Orthopedic Surgery

## 2020-03-01 DIAGNOSIS — M25461 Effusion, right knee: Secondary | ICD-10-CM

## 2020-03-02 ENCOUNTER — Other Ambulatory Visit: Payer: Self-pay

## 2020-03-02 ENCOUNTER — Other Ambulatory Visit: Payer: Medicare Other

## 2020-03-02 DIAGNOSIS — Z20822 Contact with and (suspected) exposure to covid-19: Secondary | ICD-10-CM | POA: Diagnosis not present

## 2020-03-02 DIAGNOSIS — J4540 Moderate persistent asthma, uncomplicated: Secondary | ICD-10-CM

## 2020-03-02 MED ORDER — ALBUTEROL SULFATE 0.63 MG/3ML IN NEBU: 1.0000 | INHALATION_SOLUTION | Freq: Four times a day (QID) | RESPIRATORY_TRACT | 3 refills | Status: DC | PRN

## 2020-03-03 LAB — SARS-COV-2, NAA 2 DAY TAT

## 2020-03-03 LAB — NOVEL CORONAVIRUS, NAA: SARS-CoV-2, NAA: NOT DETECTED

## 2020-03-13 ENCOUNTER — Other Ambulatory Visit: Payer: Self-pay

## 2020-03-13 MED ORDER — PRAVASTATIN SODIUM 20 MG PO TABS: 20.0000 mg | ORAL_TABLET | Freq: Every day | ORAL | 0 refills | Status: DC

## 2020-03-14 ENCOUNTER — Other Ambulatory Visit: Payer: Self-pay

## 2020-03-30 ENCOUNTER — Other Ambulatory Visit: Payer: Self-pay | Admitting: Neurology

## 2020-03-30 ENCOUNTER — Telehealth: Payer: Self-pay | Admitting: *Deleted

## 2020-03-30 NOTE — Telephone Encounter (Signed)
LVM informing patient that we have received refill request on Depakote. Will refill x 1 month again but she must call and schedule FU, last seen 12/2018. I advised we are closed now until Mon so she needs to call Monday to schedule. Otherwise she must get future refills through  Her PCP. Repeated these instructions, left office #.

## 2020-03-30 NOTE — Telephone Encounter (Signed)
Depakote refilled x 1 month with note to pharmacy: must schedule FU for further refills.

## 2020-04-10 ENCOUNTER — Ambulatory Visit (INDEPENDENT_AMBULATORY_CARE_PROVIDER_SITE_OTHER): Payer: Medicare Other | Admitting: Nurse Practitioner

## 2020-04-10 ENCOUNTER — Encounter: Payer: Self-pay | Admitting: Nurse Practitioner

## 2020-04-10 ENCOUNTER — Other Ambulatory Visit: Payer: Self-pay

## 2020-04-10 DIAGNOSIS — G603 Idiopathic progressive neuropathy: Secondary | ICD-10-CM

## 2020-04-10 DIAGNOSIS — G629 Polyneuropathy, unspecified: Secondary | ICD-10-CM | POA: Diagnosis not present

## 2020-04-10 MED ORDER — GABAPENTIN 100 MG PO CAPS: 100.0000 mg | ORAL_CAPSULE | Freq: Three times a day (TID) | ORAL | 3 refills | Status: DC

## 2020-04-10 NOTE — Progress Notes (Signed)
Acute Office Visit  Subjective:    Patient ID: Patty [name], female    DOB: 1966/04/01, 55 y.o.   MRN: [id number]  Chief Complaint  Patient presents with  . Numbness    Fingers are numb and painful x 2 months     HPI Patient is in today for numbness and tingling. She has hx of stroke, and she is a poor historian. I reviewed the last note from neurology (12/28/2018), and it states that she has hx of right-sided weakness and numbness.  Today, she states she has numbness the fingers of her left hand. She states that it has been off and on for 2 months.  She states she has had tick bites in the past, but that was years ago and she hasn't had issues.  She does not take any medication for peripheral neuropathy.    Past Medical History:  Diagnosis Date  . Acute respiratory failure with hypoxia (HCC) 10/18/2014  . Asthma   . Cholecystitis with cholelithiasis 07/12/2014  . Cholelithiasis with acute cholecystitis 07/13/2014  . DDD (degenerative disc disease), lumbar   . Depression   . HTN (hypertension)   . Hyperlipidemia   . Rectal bleed 11/13/2011  . Seizures (HCC)    remote  . Stroke [facility]) 1990   chronic right hemiparesis    Past Surgical History:  Procedure Laterality Date  . CESAREAN SECTION    . CHOLECYSTECTOMY N/A 07/13/2014   Procedure: LAPAROSCOPIC CONVERTED TO OPEN  CHOLECYSTECTOMY ;  Surgeon: Darnell Level, MD;  Location: [facility];  Service: General;  Laterality: N/A;  . COLONOSCOPY  12/24/2011   Procedure: COLONOSCOPY;  Surgeon: West Bali, MD;  [facility];  Service: Endoscopy;  Laterality: N/A;  2:00  . GALLBLADDER SURGERY     [name] /  . PARTIAL HYSTERECTOMY     heavy bleeding    Family History  Problem Relation Age of Onset  . Stroke Father   . Diabetes Other   . Colon cancer Neg Hx   . Liver disease Neg Hx     Social History   Socioeconomic History  . Marital status: Divorced    Spouse name: Not on file  . Number of children:  2  . Years of education: Not on file  . Highest education level: Not on file  Occupational History  . Occupation: disabled    Associate Professor: UNEMPLOYED  Tobacco Use  . Smoking status: Never Smoker  . Smokeless tobacco: Never Used  Vaping Use  . Vaping Use: Never used  Substance and Sexual Activity  . Alcohol use: Yes    Alcohol/week: 2.0 standard drinks    Types: 2 Standard drinks or equivalent per week    Comment: couple of drinks on weekends  . Drug use: No  . Sexual activity: Not Currently  Other Topics Concern  . Not on file  Social History Narrative   On disability. Used to work at [employer]/Worked at [employer] Retired now   [employer].    Two children.    Daughter: 54    Son: 65   Has seven grand children.    Enjoys reading      Diet: eats lots of fruits and veggies, occasional meat: chicken mostly, likes pasta    Caffeine: Tea-green lipton   Water: about 5 cups daily      Wears seatbelt   Smoke detectors      Social Determinants of Health   Financial Resource Strain: Not on  file  Food Insecurity: Not on file  Transportation Needs: Not on file  Physical Activity: Not on file  Stress: Not on file  Social Connections: Not on file  Intimate Partner Violence: Not on file    Outpatient Medications Prior to Visit  Medication Sig Dispense Refill  . albuterol (ACCUNEB) 0.63 MG/3ML nebulizer solution Take 3 mLs (0.63 mg total) by nebulization every 6 (six) hours as needed for wheezing or shortness of breath. 3 mL 3  . albuterol (VENTOLIN HFA) 108 (90 Base) MCG/ACT inhaler Inhale 1-2 puffs into the lungs every 6 (six) hours as needed for wheezing or shortness of breath. 8 g 1  . aspirin EC 81 MG tablet Take 1 tablet (81 mg total) by mouth daily.    . budesonide-formoterol (SYMBICORT) 80-4.5 MCG/ACT inhaler INHALE 2 PUFFS BY MOUTH EVERY 12 HOURS 10.2 g 2  . diclofenac sodium (VOLTAREN) 1 % GEL Apply 1 application topically 2 (two) times a day.    . divalproex  (DEPAKOTE) 250 MG DR tablet TAKE 1 TABLET BY MOUTH THREE TIMES DAILY 90 tablet 0  . escitalopram (LEXAPRO) 10 MG tablet TAKE 1 TABLET(10 MG) BY MOUTH DAILY 90 tablet 1  . levalbuterol (XOPENEX HFA) 45 MCG/ACT inhaler INHALE 1 TO 2 PUFFS BY MOUTH EVERY 4 TO 6 HOURS AS NEEDED FOR WHEEZING 15 g 0  . meloxicam (MOBIC) 7.5 MG tablet TAKE 1 TABLET(7.5 MG) BY MOUTH DAILY 30 tablet 5  . montelukast (SINGULAIR) 10 MG tablet Take 1 tablet (10 mg total) by mouth at bedtime. 90 tablet 1  . oxybutynin (DITROPAN-XL) 5 MG 24 hr tablet Take 5 mg by mouth daily.    . pravastatin (PRAVACHOL) 20 MG tablet Take 1 tablet (20 mg total) by mouth daily. 30 tablet 0  . verapamil (CALAN) 120 MG tablet Take 1 tablet (120 mg total) by mouth daily. 90 tablet 1   No facility-administered medications prior to visit.    No Known Allergies  Review of Systems  Constitutional: Negative.   Respiratory: Negative.   Cardiovascular: Negative.   Neurological: Positive for numbness. Negative for seizures, facial asymmetry, speech difficulty and headaches.       To fingers of left hand       Objective:    Physical Exam Constitutional:      Appearance: Normal appearance.  Cardiovascular:     Rate and Rhythm: Normal rate and regular rhythm.     Pulses: Normal pulses.  Pulmonary:     Effort: Pulmonary effort is normal.     Breath sounds: Normal breath sounds.  Musculoskeletal:     Comments: Negative Phalen's test  Neurological:     General: No focal deficit present.     Mental Status: She is alert and oriented to person, place, and time. Mental status is at baseline.     Sensory: No sensory deficit.     Motor: No weakness.     BP 133/79   Pulse 76   Temp 98.7 F (37.1 C)   Resp 16   Ht 5\' 2"  (1.575 m)   Wt 210 lb (95.3 kg)   SpO2 98%   BMI 38.41 kg/m  Wt Readings from Last 3 Encounters:  04/10/20 210 lb (95.3 kg)  12/01/19 210 lb (95.3 kg)  10/20/19 210 lb (95.3 kg)    Health Maintenance Due  Topic  Date Due  . Hepatitis C Screening  Never done  . COVID-19 Vaccine (1) Never done  . TETANUS/TDAP  Never done  .  PAP SMEAR-Modifier  09/23/2016  . INFLUENZA VACCINE  10/31/2019    There are no preventive care reminders to display for this patient.   Lab Results  Component Value Date   TSH 1.37 09/08/2018   Lab Results  Component Value Date   WBC 6.0 09/08/2018   HGB 14.2 09/08/2018   HCT 41.7 09/08/2018   MCV 89.7 09/08/2018   PLT 265 09/08/2018   Lab Results  Component Value Date   NA 142 09/08/2018   K 3.5 09/08/2018   CO2 27 09/08/2018   GLUCOSE 92 09/08/2018   BUN 14 09/08/2018   CREATININE 0.89 09/08/2018   BILITOT 0.4 09/08/2018   ALKPHOS 87 10/19/2014   AST 14 09/08/2018   ALT 16 09/08/2018   PROT 7.4 09/08/2018   ALBUMIN 3.5 10/19/2014   CALCIUM 9.5 09/08/2018   ANIONGAP 7 03/07/2016   Lab Results  Component Value Date   CHOL 170 09/09/2018   Lab Results  Component Value Date   HDL 47 (L) 09/09/2018   Lab Results  Component Value Date   LDLCALC 101 (H) 09/09/2018   Lab Results  Component Value Date   TRIG 120 09/09/2018   Lab Results  Component Value Date   CHOLHDL 3.6 09/09/2018   Lab Results  Component Value Date   HGBA1C 5.7 (H) 09/08/2018       Assessment & Plan:   Problem List Items Addressed This Visit      Nervous and Auditory   Peripheral neuropathy    -states her numbness has been off and on for 2 months -just affects the fingers of her left hand -Rx. Gabapentin -will check labs today      Relevant Medications   gabapentin (NEURONTIN) 100 MG capsule   Other Relevant Orders   CBC with Differential/Platelet   TSH + free T4   B12   Lyme Ab/Western Blot Reflex   B12       Meds ordered this encounter  Medications  . gabapentin (NEURONTIN) 100 MG capsule    Sig: Take 1 capsule (100 mg total) by mouth 3 (three) times daily.    Dispense:  90 capsule    Refill:  3     Heather Roberts, NP

## 2020-04-10 NOTE — Assessment & Plan Note (Signed)
-  states her numbness has been off and on for 2 months -just affects the fingers of her left hand -Rx. Gabapentin -will check labs today

## 2020-04-10 NOTE — Patient Instructions (Signed)
We will meet up again in 2 weeks if your numbness does not improve with medication.

## 2020-04-11 ENCOUNTER — Other Ambulatory Visit: Payer: Self-pay

## 2020-04-11 ENCOUNTER — Telehealth: Payer: Self-pay

## 2020-04-11 DIAGNOSIS — I10 Essential (primary) hypertension: Secondary | ICD-10-CM

## 2020-04-11 MED ORDER — BUDESONIDE-FORMOTEROL FUMARATE 80-4.5 MCG/ACT IN AERO: INHALATION_SPRAY | RESPIRATORY_TRACT | 2 refills | Status: DC

## 2020-04-11 MED ORDER — LEVALBUTEROL TARTRATE 45 MCG/ACT IN AERO: INHALATION_SPRAY | RESPIRATORY_TRACT | 0 refills | Status: DC

## 2020-04-11 MED ORDER — PRAVASTATIN SODIUM 20 MG PO TABS: 20.0000 mg | ORAL_TABLET | Freq: Every day | ORAL | 0 refills | Status: DC

## 2020-04-11 MED ORDER — VERAPAMIL HCL 120 MG PO TABS: 120.0000 mg | ORAL_TABLET | Freq: Every day | ORAL | 1 refills | Status: DC

## 2020-04-11 NOTE — Telephone Encounter (Signed)
She has back to back appts coming up- we need to cancel one of them.

## 2020-04-11 NOTE — Telephone Encounter (Signed)
CPE with you cancelled. She is going to keep the appt with Casimiro Needle.

## 2020-04-11 NOTE — Progress Notes (Signed)
Her labs look great. Her B12 is a little elevated. If she is taking a B-12 supplement she can cut that in half or take it every other day, but elevated B12 is not likely to be the cause of her symptoms.  The lyme testing is still pending.

## 2020-04-11 NOTE — Telephone Encounter (Signed)
Patient needing refills on  Pravastatin Verapamil symbicort And  xopenex inhaler  Also states she is returning a phone call about blood work she had done p# [phone]

## 2020-04-12 LAB — CBC WITH DIFFERENTIAL/PLATELET
Basophils Absolute: 0 10*3/uL (ref 0.0–0.2)
Basos: 1 %
EOS (ABSOLUTE): 0.4 10*3/uL (ref 0.0–0.4)
Eos: 5 %
Hematocrit: 41.9 % (ref 34.0–46.6)
Hemoglobin: 14.3 g/dL (ref 11.1–15.9)
Immature Grans (Abs): 0 10*3/uL (ref 0.0–0.1)
Immature Granulocytes: 0 %
Lymphocytes Absolute: 2.7 10*3/uL (ref 0.7–3.1)
Lymphs: 35 %
MCH: 31.2 pg (ref 26.6–33.0)
MCHC: 34.1 g/dL (ref 31.5–35.7)
MCV: 91 fL (ref 79–97)
Monocytes Absolute: 0.5 10*3/uL (ref 0.1–0.9)
Monocytes: 6 %
Neutrophils Absolute: 4.2 10*3/uL (ref 1.4–7.0)
Neutrophils: 53 %
Platelets: 276 10*3/uL (ref 150–450)
RBC: 4.59 x10E6/uL (ref 3.77–5.28)
RDW: 12.8 % (ref 11.7–15.4)
WBC: 7.8 10*3/uL (ref 3.4–10.8)

## 2020-04-12 LAB — TSH+FREE T4
Free T4: 1.08 ng/dL (ref 0.82–1.77)
TSH: 1.77 u[IU]/mL (ref 0.450–4.500)

## 2020-04-12 LAB — LYME AB/WESTERN BLOT REFLEX
LYME DISEASE AB, QUANT, IGM: <0.8 index (ref 0.00–0.79)
Lyme IgG/IgM Ab: <0.91 {ISR} (ref 0.00–0.90)

## 2020-04-12 LAB — VITAMIN B12: Vitamin B-12: 1336 pg/mL — ABNORMAL HIGH (ref 232–1245)

## 2020-04-18 ENCOUNTER — Encounter: Payer: Self-pay | Admitting: Internal Medicine

## 2020-04-18 ENCOUNTER — Telehealth (INDEPENDENT_AMBULATORY_CARE_PROVIDER_SITE_OTHER): Payer: Medicare Other | Admitting: Internal Medicine

## 2020-04-18 ENCOUNTER — Ambulatory Visit: Payer: Medicare Other | Admitting: Family Medicine

## 2020-04-18 ENCOUNTER — Other Ambulatory Visit: Payer: Self-pay

## 2020-04-18 DIAGNOSIS — G609 Hereditary and idiopathic neuropathy, unspecified: Secondary | ICD-10-CM

## 2020-04-18 DIAGNOSIS — R42 Dizziness and giddiness: Secondary | ICD-10-CM | POA: Diagnosis not present

## 2020-04-18 NOTE — Patient Instructions (Addendum)
Please stop taking Gabapentin.  Please stay hydrated by taking at least 2 liters of fluid in a day.  Please use wrist splint at night to help with numbness and tingling.  Follow these instructions at home: If you have a splint or brace:  Wear the splint or brace as told by your doctor. Take it off only as told by your doctor.  Loosen the splint if your fingers: ? Tingle. ? Become numb. ? Turn cold and blue.  Keep the splint or brace clean.  If the splint or brace is not waterproof: ? Do not let it get wet. ? Cover it with a watertight covering when you take a bath or a shower. Managing pain, stiffness, and swelling If told, put ice on the painful area:  If you have a removable splint or brace, remove it as told by your doctor.  Put ice in a plastic bag.  Place a towel between your skin and the bag.  Leave the ice on for 20 minutes, 2-3 times per day. Do not fall asleep with the cold pack on your skin.  Take off the ice if your skin turns bright red. This is very important. If you cannot feel pain, heat, or cold, you have a greater risk of damage to the area. Move your fingers often to reduce stiffness and swelling.

## 2020-04-18 NOTE — Progress Notes (Signed)
Virtual Visit via Telephone Note   This visit type was conducted due to national recommendations for restrictions regarding the COVID-19 Pandemic (e.g. social distancing) in an effort to limit this patient's exposure and mitigate transmission in our community.  Due to her co-morbid illnesses, this patient is at least at moderate risk for complications without adequate follow up.  This format is felt to be most appropriate for this patient at this time.  The patient did not have access to video technology/had technical difficulties with video requiring transitioning to audio format only (telephone).  All issues noted in this document were discussed and addressed.  No physical exam could be performed with this format.  Evaluation Performed:  Follow-up visit  Date:  04/18/2020   ID:  Patty [name], DOB 1966-02-03, MRN [id number]  Patient Location: Home Provider Location: Office/Clinic  Participants: Patient Location of Patient: Home Location of Provider: Telehealth Consent was obtain for visit to be over via telehealth. I verified that I am speaking with the correct person using two identifiers.  PCP:  Patty Finner, NP   Chief Complaint:  Dizziness  History of Present Illness:    Patty [name] is a 55 y.o. female with PMH of hypertension, seizure disorder, asthma, hyperlipidemia and obesity who has a televisit for complaint of dizziness.  She was recently started on gabapentin for likely peripheral neuropathy as she had intermittent numbness and tingling in her left hand.  She started having dizziness after starting gabapentin.  She denies any rash or other allergic reaction.  She denies any vision change. Denies any recent fall.  The patient does not have symptoms concerning for COVID-19 infection (fever, chills, cough, or new shortness of breath).   Past Medical, Surgical, Social History, Allergies, and Medications have been Reviewed.  Past Medical History:  Diagnosis Date  .  Acute respiratory failure with hypoxia (HCC) 10/18/2014  . Asthma   . Cholecystitis with cholelithiasis 07/12/2014  . Cholelithiasis with acute cholecystitis 07/13/2014  . DDD (degenerative disc disease), lumbar   . Depression   . HTN (hypertension)   . Hyperlipidemia   . Rectal bleed 11/13/2011  . Seizures (HCC)    remote  . Stroke [facility]) 1990   chronic right hemiparesis   Past Surgical History:  Procedure Laterality Date  . CESAREAN SECTION    . CHOLECYSTECTOMY N/A 07/13/2014   Procedure: LAPAROSCOPIC CONVERTED TO OPEN  CHOLECYSTECTOMY ;  Surgeon: Darnell Level, MD;  Location: [facility];  Service: General;  Laterality: N/A;  . COLONOSCOPY  12/24/2011   Procedure: COLONOSCOPY;  Surgeon: West Bali, MD;  [facility];  Service: Endoscopy;  Laterality: N/A;  2:00  . GALLBLADDER SURGERY     Patty [name] /[place]  . PARTIAL HYSTERECTOMY     heavy bleeding     Current Meds  Medication Sig  . albuterol (ACCUNEB) 0.63 MG/3ML nebulizer solution Take 3 mLs (0.63 mg total) by nebulization every 6 (six) hours as needed for wheezing or shortness of breath.  [name] albuterol (VENTOLIN HFA) 108 (90 Base) MCG/ACT inhaler Inhale 1-2 puffs into the lungs every 6 (six) hours as needed for wheezing or shortness of breath.  [name] aspirin EC 81 MG tablet Take 1 tablet (81 mg total) by mouth daily.  . budesonide-formoterol (SYMBICORT) 80-4.5 MCG/ACT inhaler INHALE 2 PUFFS BY MOUTH EVERY 12 HOURS  . diclofenac sodium (VOLTAREN) 1 % GEL Apply 1 application topically 2 (two) times a day.  . divalproex (DEPAKOTE) 250 MG  DR tablet TAKE 1 TABLET BY MOUTH THREE TIMES DAILY  . escitalopram (LEXAPRO) 10 MG tablet TAKE 1 TABLET(10 MG) BY MOUTH DAILY  . gabapentin (NEURONTIN) 100 MG capsule Take 1 capsule (100 mg total) by mouth 3 (three) times daily.  [name] levalbuterol (XOPENEX HFA) 45 MCG/ACT inhaler INHALE 1 TO 2 PUFFS BY MOUTH EVERY 4 TO 6 HOURS AS NEEDED FOR WHEEZING  . meloxicam (MOBIC) 7.5 MG tablet TAKE 1  TABLET(7.5 MG) BY MOUTH DAILY  . montelukast (SINGULAIR) 10 MG tablet Take 1 tablet (10 mg total) by mouth at bedtime.  [name] oxybutynin (DITROPAN-XL) 5 MG 24 hr tablet Take 5 mg by mouth daily.  . pravastatin (PRAVACHOL) 20 MG tablet Take 1 tablet (20 mg total) by mouth daily.  . verapamil (CALAN) 120 MG tablet Take 1 tablet (120 mg total) by mouth daily.     Allergies:   Patient has no known allergies.   ROS:   Please see the history of present illness.     All other systems reviewed and are negative.   Labs/Other Tests and Data Reviewed:    Recent Labs: 04/10/2020: Hemoglobin 14.3; Platelets 276; TSH 1.770   Recent Lipid Panel Lab Results  Component Value Date/Time   CHOL 170 09/09/2018 07:50 AM   TRIG 120 09/09/2018 07:50 AM   HDL 47 (L) 09/09/2018 07:50 AM   CHOLHDL 3.6 09/09/2018 07:50 AM   LDLCALC 101 (H) 09/09/2018 07:50 AM    Wt Readings from Last 3 Encounters:  04/10/20 210 lb (95.3 kg)  12/01/19 210 lb (95.3 kg)  10/20/19 210 lb (95.3 kg)      ASSESSMENT & PLAN:    Dizziness Likely due to gabapentin Advised to stop taking gabapentin Advised to stay hydrated by taking at least 2 L of fluid in a day Advised to contact if symptoms are persistent  Numbness and tingling of left hand Intermittent, not disturbing daily activities Could be a sign of carpal tunnel syndrome Advised to use wrist splint Simple wrist exercises  Time:   Today, I have spent 16 minutes reviewing the chart, including problem list, medications, and with the patient with telehealth technology discussing the above problems.   Medication Adjustments/Labs and Tests Ordered: Current medicines are reviewed at length with the patient today.  Concerns regarding medicines are outlined above.   Tests Ordered: No orders of the defined types were placed in this encounter.   Medication Changes: No orders of the defined types were placed in this encounter.    Note: This dictation was prepared  with Dragon dictation along with smaller phrase technology. Similar sounding words can be transcribed inadequately or may not be corrected upon review. Any transcriptional errors that result from this process are unintentional.      Disposition:  Follow up  Signed, Anabel Halon, MD  04/18/2020 11:37 AM     [facility]

## 2020-04-24 ENCOUNTER — Ambulatory Visit: Payer: Medicare Other | Admitting: Nurse Practitioner

## 2020-05-08 ENCOUNTER — Encounter: Payer: Self-pay | Admitting: Internal Medicine

## 2020-05-08 ENCOUNTER — Other Ambulatory Visit: Payer: Self-pay

## 2020-05-08 ENCOUNTER — Telehealth (INDEPENDENT_AMBULATORY_CARE_PROVIDER_SITE_OTHER): Payer: Medicare Other | Admitting: Internal Medicine

## 2020-05-08 DIAGNOSIS — G609 Hereditary and idiopathic neuropathy, unspecified: Secondary | ICD-10-CM | POA: Diagnosis not present

## 2020-05-08 DIAGNOSIS — J4540 Moderate persistent asthma, uncomplicated: Secondary | ICD-10-CM

## 2020-05-08 MED ORDER — UNABLE TO FIND: 1.0000 "application " | Freq: Every day | 0 refills | Status: AC

## 2020-05-08 MED ORDER — MY MDI PORTABLE NEBULISER MISC: 1.0000 "application " | 0 refills | Status: AC | PRN

## 2020-05-08 MED ORDER — LEVALBUTEROL TARTRATE 45 MCG/ACT IN AERO: INHALATION_SPRAY | RESPIRATORY_TRACT | 0 refills | Status: DC

## 2020-05-08 NOTE — Progress Notes (Signed)
Virtual Visit via Telephone Note   This visit type was conducted due to national recommendations for restrictions regarding the COVID-19 Pandemic (e.g. social distancing) in an effort to limit this patient's exposure and mitigate transmission in our community.  Due to her co-morbid illnesses, this patient is at least at moderate risk for complications without adequate follow up.  This format is felt to be most appropriate for this patient at this time.  The patient did not have access to video technology/had technical difficulties with video requiring transitioning to audio format only (telephone).  All issues noted in this document were discussed and addressed.  No physical exam could be performed with this format.  Evaluation Performed:  Follow-up visit  Date:  05/08/2020   ID:  Patty [name] Mar 27, 1966, MRN [id number]  Patient Location: Home Provider Location: Office/Clinic  Location of Patient: Home Location of Provider: Telehealth Consent was obtain for visit to be over via telehealth. I verified that I am speaking with the correct person using two identifiers.  PCP:  Freddy Finner, NP   Chief Complaint: Numbness of left hand  History of Present Illness:    Patty [name] is a 55 y.o. female who has a televisit for c/o left sided hand numbness and wrist pain. She was prescribed Gabapentin, but she started taking Aleve instead. She has not tried using splint yet. She denies any tingling or weakness of the hand yet.  She also mentions that her nebulizer has not been working well and has been having difficulty with her nebulizer treatment for asthma. Denies any fever, chills, severe dyspnea or wheezing.  The patient does not have symptoms concerning for COVID-19 infection (fever, chills, cough, or new shortness of breath).   Past Medical, Surgical, Social History, Allergies, and Medications have been Reviewed.  Past Medical History:  Diagnosis Date  . Acute respiratory  failure with hypoxia (HCC) 10/18/2014  . Asthma   . Cholecystitis with cholelithiasis 07/12/2014  . Cholelithiasis with acute cholecystitis 07/13/2014  . DDD (degenerative disc disease), lumbar   . Depression   . HTN (hypertension)   . Hyperlipidemia   . Rectal bleed 11/13/2011  . Seizures (HCC)    remote  . Stroke [facility]) 1990   chronic right hemiparesis   Past Surgical History:  Procedure Laterality Date  . CESAREAN SECTION    . CHOLECYSTECTOMY N/A 07/13/2014   Procedure: LAPAROSCOPIC CONVERTED TO OPEN  CHOLECYSTECTOMY ;  Surgeon: Darnell Level, MD;  Location: [facility];  Service: General;  Laterality: N/A;  . COLONOSCOPY  12/24/2011   Procedure: COLONOSCOPY;  Surgeon: West Bali, MD;  [facility];  Service: Endoscopy;  Laterality: N/A;  2:00  . GALLBLADDER SURGERY     [name] /[place]  . PARTIAL HYSTERECTOMY     heavy bleeding     Current Meds  Medication Sig  . albuterol (ACCUNEB) 0.63 MG/3ML nebulizer solution Take 3 mLs (0.63 mg total) by nebulization every 6 (six) hours as needed for wheezing or shortness of breath.  [name] albuterol (VENTOLIN HFA) 108 (90 Base) MCG/ACT inhaler Inhale 1-2 puffs into the lungs every 6 (six) hours as needed for wheezing or shortness of breath.  [name] aspirin EC 81 MG tablet Take 1 tablet (81 mg total) by mouth daily.  . budesonide-formoterol (SYMBICORT) 80-4.5 MCG/ACT inhaler INHALE 2 PUFFS BY MOUTH EVERY 12 HOURS  . diclofenac sodium (VOLTAREN) 1 % GEL Apply 1 application topically 2 (two) times a day.  [name]  divalproex (DEPAKOTE) 250 MG DR tablet TAKE 1 TABLET BY MOUTH THREE TIMES DAILY  . escitalopram (LEXAPRO) 10 MG tablet TAKE 1 TABLET(10 MG) BY MOUTH DAILY  . meloxicam (MOBIC) 7.5 MG tablet TAKE 1 TABLET(7.5 MG) BY MOUTH DAILY  . montelukast (SINGULAIR) 10 MG tablet Take 1 tablet (10 mg total) by mouth at bedtime.  [name] oxybutynin (DITROPAN-XL) 5 MG 24 hr tablet Take 5 mg by mouth daily.  . pravastatin (PRAVACHOL) 20 MG tablet Take 1  tablet (20 mg total) by mouth daily.  . verapamil (CALAN) 120 MG tablet Take 1 tablet (120 mg total) by mouth daily.  . [DISCONTINUED] levalbuterol (XOPENEX HFA) 45 MCG/ACT inhaler INHALE 1 TO 2 PUFFS BY MOUTH EVERY 4 TO 6 HOURS AS NEEDED FOR WHEEZING  . Nebulizers (MY MDI PORTABLE NEBULISER) MISC 1 application by Does not apply route as needed.  [name] UNABLE TO FIND 1 application by Other route daily. Wrist splint for carpal tunnel syndrome: Apply it daily.     Allergies:   Patient has no known allergies.   ROS:   Please see the history of present illness.     All other systems reviewed and are negative.   Labs/Other Tests and Data Reviewed:    Recent Labs: 04/10/2020: Hemoglobin 14.3; Platelets 276; TSH 1.770   Recent Lipid Panel Lab Results  Component Value Date/Time   CHOL 170 09/09/2018 07:50 AM   TRIG 120 09/09/2018 07:50 AM   HDL 47 (L) 09/09/2018 07:50 AM   CHOLHDL 3.6 09/09/2018 07:50 AM   LDLCALC 101 (H) 09/09/2018 07:50 AM    Wt Readings from Last 3 Encounters:  04/10/20 210 lb (95.3 kg)  12/01/19 210 lb (95.3 kg)  10/20/19 210 lb (95.3 kg)      ASSESSMENT & PLAN:    Moderate persistent asthma without complication Overall stable Refilled Levalbuterol nebs New prescription for nebulizer  Peripheral neuropathy Advised to take Gabapentin Wrist splint prescription sent     Time:   Today, I have spent 12 minutes reviewing the chart, including problem list, medications, and with the patient with telehealth technology discussing the above problems.   Medication Adjustments/Labs and Tests Ordered: Current medicines are reviewed at length with the patient today.  Concerns regarding medicines are outlined above.   Tests Ordered: No orders of the defined types were placed in this encounter.   Medication Changes: Meds ordered this encounter  Medications  . Nebulizers (MY MDI PORTABLE NEBULISER) MISC    Sig: 1 application by Does not apply route as needed.     Dispense:  1 each    Refill:  0  . levalbuterol (XOPENEX HFA) 45 MCG/ACT inhaler    Sig: INHALE 1 TO 2 PUFFS BY MOUTH EVERY 4 TO 6 HOURS AS NEEDED FOR WHEEZING    Dispense:  15 g    Refill:  0  . UNABLE TO FIND    Sig: 1 application by Other route daily. Wrist splint for carpal tunnel syndrome: Apply it daily.    Dispense:  1 each    Refill:  0     Note: This dictation was prepared with Dragon dictation along with smaller phrase technology. Similar sounding words can be transcribed inadequately or may not be corrected upon review. Any transcriptional errors that result from this process are unintentional.      Disposition:  Follow up  Signed, Anabel Halon, MD  05/08/2020 11:48 AM     [facility]

## 2020-05-08 NOTE — Patient Instructions (Signed)
Please continue to take Gabapentin as prescribed.  Please apply wrist splint daily.

## 2020-05-08 NOTE — Assessment & Plan Note (Signed)
Advised to take Gabapentin Wrist splint prescription sent

## 2020-05-08 NOTE — Assessment & Plan Note (Addendum)
Overall stable Refilled Levalbuterol nebs New prescription for nebulizer

## 2020-05-15 ENCOUNTER — Other Ambulatory Visit: Payer: Self-pay | Admitting: Family Medicine

## 2020-05-17 DIAGNOSIS — Z23 Encounter for immunization: Secondary | ICD-10-CM | POA: Diagnosis not present

## 2020-05-19 ENCOUNTER — Ambulatory Visit: Payer: Medicare Other | Admitting: Internal Medicine

## 2020-05-23 ENCOUNTER — Other Ambulatory Visit: Payer: Self-pay

## 2020-05-23 ENCOUNTER — Telehealth (INDEPENDENT_AMBULATORY_CARE_PROVIDER_SITE_OTHER): Payer: Medicare Other | Admitting: Nurse Practitioner

## 2020-05-23 ENCOUNTER — Encounter: Payer: Self-pay | Admitting: Nurse Practitioner

## 2020-05-23 DIAGNOSIS — J069 Acute upper respiratory infection, unspecified: Secondary | ICD-10-CM | POA: Diagnosis not present

## 2020-05-23 MED ORDER — AZITHROMYCIN 250 MG PO TABS: ORAL_TABLET | ORAL | 0 refills | Status: DC

## 2020-05-23 MED ORDER — HYDROCOD POLST-CPM POLST ER 10-8 MG/5ML PO SUER: 5.0000 mL | Freq: Two times a day (BID) | ORAL | 0 refills | Status: DC | PRN

## 2020-05-23 MED ORDER — PREDNISONE 10 MG PO TABS: ORAL_TABLET | ORAL | 0 refills | Status: AC

## 2020-05-23 NOTE — Progress Notes (Signed)
Acute Office Visit  Subjective:    Patient ID: Patty [name], female    DOB: 11-09-1965, 55 y.o.   MRN: [id number]  Chief Complaint  Patient presents with  . Fever    Received covid booster on 2/16- Started 2/17, fever, no appetite, fatigue. Her energy slowly coming back but still feels bad  . Generalized Body Aches        . Cough    Everytime she coughs she is hurting in her ribs. Coughing up colored dark mucus    HPI Patient is in today for cough, fever, and body aches that started after her COVID booster.  She states that she has been using her nebulizer and inhalers more frequently, and that has been helping.  She has pain under her breast when she coughs.  Past Medical History:  Diagnosis Date  . Acute respiratory failure with hypoxia (HCC) 10/18/2014  . Asthma   . Cholecystitis with cholelithiasis 07/12/2014  . Cholelithiasis with acute cholecystitis 07/13/2014  . DDD (degenerative disc disease), lumbar   . Depression   . HTN (hypertension)   . Hyperlipidemia   . Rectal bleed 11/13/2011  . Seizures (HCC)    remote  . Stroke [facility]) 1990   chronic right hemiparesis    Past Surgical History:  Procedure Laterality Date  . CESAREAN SECTION    . CHOLECYSTECTOMY N/A 07/13/2014   Procedure: LAPAROSCOPIC CONVERTED TO OPEN  CHOLECYSTECTOMY ;  Surgeon: Darnell Level, MD;  Location: [facility];  Service: General;  Laterality: N/A;  . COLONOSCOPY  12/24/2011   Procedure: COLONOSCOPY;  Surgeon: West Bali, MD;  Location: [facility] SU[facility];  Service: Endoscopy;  Laterality: N/A;  2:00  . GALLBLADDER SURGERY     [name] /[place]  . PARTIAL HYSTERECTOMY     heavy bleeding    Family History  Problem Relation Age of Onset  . Stroke Father   . Diabetes Other   . Colon cancer Neg Hx   . Liver disease Neg Hx     Social History   Socioeconomic History  . Marital status: Divorced    Spouse name: Not on file  . Number of children: 2  . Years of education: Not on file  .  Highest education level: Not on file  Occupational History  . Occupation: disabled    Associate Professor: UNEMPLOYED  Tobacco Use  . Smoking status: Never Smoker  . Smokeless tobacco: Never Used  Vaping Use  . Vaping Use: Never used  Substance and Sexual Activity  . Alcohol use: Yes    Alcohol/week: 2.0 standard drinks    Types: 2 Standard drinks or equivalent per week    Comment: couple of drinks on weekends  . Drug use: No  . Sexual activity: Not Currently  Other Topics Concern  . Not on file  Social History Narrative   On disability. Used to work at [employer]/Worked at [employer] Retired now   [employer].    Two children.    Daughter: 20    Son: 74   Has seven grand children.    Enjoys reading      Diet: eats lots of fruits and veggies, occasional meat: chicken mostly, likes pasta    Caffeine: Tea-green lipton   Water: about 5 cups daily      Wears seatbelt   Smoke detectors      Social Determinants of Health   Financial Resource Strain: Not on file  Food Insecurity: Not on file  Transportation Needs:  Not on file  Physical Activity: Not on file  Stress: Not on file  Social Connections: Not on file  Intimate Partner Violence: Not on file    Outpatient Medications Prior to Visit  Medication Sig Dispense Refill  . albuterol (ACCUNEB) 0.63 MG/3ML nebulizer solution Take 3 mLs (0.63 mg total) by nebulization every 6 (six) hours as needed for wheezing or shortness of breath. 3 mL 3  . albuterol (VENTOLIN HFA) 108 (90 Base) MCG/ACT inhaler Inhale 1-2 puffs into the lungs every 6 (six) hours as needed for wheezing or shortness of breath. 8 g 1  . aspirin EC 81 MG tablet Take 1 tablet (81 mg total) by mouth daily.    . budesonide-formoterol (SYMBICORT) 80-4.5 MCG/ACT inhaler INHALE 2 PUFFS BY MOUTH EVERY 12 HOURS 10.2 g 2  . diclofenac sodium (VOLTAREN) 1 % GEL Apply 1 application topically 2 (two) times a day.    . divalproex (DEPAKOTE) 250 MG DR tablet TAKE 1 TABLET BY  MOUTH THREE TIMES DAILY 90 tablet 0  . escitalopram (LEXAPRO) 10 MG tablet TAKE 1 TABLET(10 MG) BY MOUTH DAILY 90 tablet 1  . levalbuterol (XOPENEX HFA) 45 MCG/ACT inhaler INHALE 1 TO 2 PUFFS BY MOUTH EVERY 4 TO 6 HOURS AS NEEDED FOR WHEEZING 15 g 0  . meloxicam (MOBIC) 7.5 MG tablet TAKE 1 TABLET(7.5 MG) BY MOUTH DAILY 30 tablet 5  . montelukast (SINGULAIR) 10 MG tablet Take 1 tablet (10 mg total) by mouth at bedtime. 90 tablet 1  . Nebulizers (MY MDI PORTABLE NEBULISER) MISC 1 application by Does not apply route as needed. 1 each 0  . oxybutynin (DITROPAN-XL) 5 MG 24 hr tablet Take 5 mg by mouth daily.    . pravastatin (PRAVACHOL) 20 MG tablet TAKE 1 TABLET(20 MG) BY MOUTH DAILY 90 tablet 2  . UNABLE TO FIND 1 application by Other route daily. Wrist splint for carpal tunnel syndrome: Apply it daily. 1 each 0  . verapamil (CALAN) 120 MG tablet Take 1 tablet (120 mg total) by mouth daily. 90 tablet 1   No facility-administered medications prior to visit.    No Known Allergies  Review of Systems  Constitutional: Positive for chills, fatigue and fever.  HENT: Positive for sore throat. Negative for congestion, rhinorrhea, sinus pressure and sinus pain.        Bloody discharge form nose at times  Respiratory: Positive for cough.        Productive cough  Cardiovascular: Negative for palpitations and leg swelling.       Pain when she is coughing       Objective:    Physical Exam  There were no vitals taken for this visit. Wt Readings from Last 3 Encounters:  04/10/20 210 lb (95.3 kg)  12/01/19 210 lb (95.3 kg)  10/20/19 210 lb (95.3 kg)    Health Maintenance Due  Topic Date Due  . Hepatitis C Screening  Never done  . TETANUS/TDAP  Never done  . PAP SMEAR-Modifier  09/23/2016    There are no preventive care reminders to display for this patient.   Lab Results  Component Value Date   TSH 1.770 04/10/2020   Lab Results  Component Value Date   WBC 7.8 04/10/2020   HGB  14.3 04/10/2020   HCT 41.9 04/10/2020   MCV 91 04/10/2020   PLT 276 04/10/2020   Lab Results  Component Value Date   NA 142 09/08/2018   K 3.5 09/08/2018   CO2 27 09/08/2018  GLUCOSE 92 09/08/2018   BUN 14 09/08/2018   CREATININE 0.89 09/08/2018   BILITOT 0.4 09/08/2018   ALKPHOS 87 10/19/2014   AST 14 09/08/2018   ALT 16 09/08/2018   PROT 7.4 09/08/2018   ALBUMIN 3.5 10/19/2014   CALCIUM 9.5 09/08/2018   ANIONGAP 7 03/07/2016   Lab Results  Component Value Date   CHOL 170 09/09/2018   Lab Results  Component Value Date   HDL 47 (L) 09/09/2018   Lab Results  Component Value Date   LDLCALC 101 (H) 09/09/2018   Lab Results  Component Value Date   TRIG 120 09/09/2018   Lab Results  Component Value Date   CHOLHDL 3.6 09/09/2018   Lab Results  Component Value Date   HGBA1C 5.7 (H) 09/08/2018       Assessment & Plan:   Problem List Items Addressed This Visit   None      Meds ordered this encounter  Medications  . azithromycin (ZITHROMAX) 250 MG tablet    Sig: Take as directed    Dispense:  6 tablet    Refill:  0    Please dispense as a z-pack  . predniSONE (DELTASONE) 10 MG tablet    Sig: Take 6 tablets (60 mg total) by mouth daily with breakfast for 1 day, THEN 5 tablets (50 mg total) daily with breakfast for 1 day, THEN 4 tablets (40 mg total) daily with breakfast for 1 day, THEN 3 tablets (30 mg total) daily with breakfast for 1 day, THEN 2 tablets (20 mg total) daily with breakfast for 1 day, THEN 1 tablet (10 mg total) daily with breakfast for 1 day.    Dispense:  21 tablet    Refill:  0  . chlorpheniramine-HYDROcodone (TUSSIONEX PENNKINETIC ER) 10-8 MG/5ML SUER    Sig: Take 5 mLs by mouth every 12 (twelve) hours as needed for cough.    Dispense:  140 mL    Refill:  0   Date:  05/23/2020   Location of Patient: Home Location of Provider: Office Consent was obtain for visit to be over via telehealth. I verified that I am speaking with the  correct person using two identifiers.  I connected with  [name] on 05/23/20 via telephone and verified that I am speaking with the correct person using two identifiers.   I discussed the limitations of evaluation and management by telemedicine. The patient expressed understanding and agreed to proceed.  Time spent: 8 minutes   Heather Roberts, NP

## 2020-05-23 NOTE — Assessment & Plan Note (Signed)
-  has fever, productive cough, and body aches x 5 days  -Rx. z-pack -Rx. tussionex -Rx. prednisone

## 2020-05-25 ENCOUNTER — Telehealth: Payer: Self-pay

## 2020-05-25 ENCOUNTER — Other Ambulatory Visit: Payer: Self-pay | Admitting: Nurse Practitioner

## 2020-05-25 MED ORDER — HYDROCOD POLST-CPM POLST ER 10-8 MG/5ML PO SUER: 5.0000 mL | Freq: Two times a day (BID) | ORAL | 0 refills | Status: DC | PRN

## 2020-05-25 NOTE — Telephone Encounter (Signed)
Im not sure why she is having so much trouble with the cough medication.

## 2020-05-25 NOTE — Telephone Encounter (Signed)
There are no generic cough meds for medicare and medicaid. She take take robitussin-DM over the counter.

## 2020-05-25 NOTE — Telephone Encounter (Signed)
Pt states that the rx for her cough syrup had a large co-pay and she could not afford it so she told them to cancel it, however she is needing it and spoke with the pharmacy about assistance and they are going to run it with a rx coupon, [facility] needs the rx sent back in.

## 2020-05-25 NOTE — Telephone Encounter (Signed)
sent 

## 2020-05-25 NOTE — Telephone Encounter (Signed)
Patient is requesting we send in a generic medication for her cough so that medicare and medicaid will cover it [facility] on [address] ph# [phone]

## 2020-05-26 NOTE — Telephone Encounter (Signed)
Pt informed

## 2020-06-09 ENCOUNTER — Other Ambulatory Visit: Payer: Self-pay | Admitting: Neurology

## 2020-06-16 ENCOUNTER — Other Ambulatory Visit: Payer: Self-pay | Admitting: Nurse Practitioner

## 2020-06-16 ENCOUNTER — Telehealth: Payer: Self-pay

## 2020-06-16 DIAGNOSIS — G629 Polyneuropathy, unspecified: Secondary | ICD-10-CM

## 2020-06-16 NOTE — Telephone Encounter (Signed)
The gabapentin is not working on her hands , she needs a referral , pt is wanting a shot in her hands

## 2020-06-16 NOTE — Telephone Encounter (Signed)
I sent in neuro referral.

## 2020-06-16 NOTE — Telephone Encounter (Signed)
Referral to ortho or neurology?

## 2020-06-19 NOTE — Telephone Encounter (Signed)
Pt informed

## 2020-07-05 ENCOUNTER — Telehealth: Payer: Self-pay | Admitting: Neurology

## 2020-07-05 NOTE — Telephone Encounter (Signed)
Spoke with Dr Lucia Gaskins and reviewed last note from September 2020. Dr Lucia Gaskins had suggested patient stay on AEDs for life as patient was stable and patient had wanted to remain on them. She was advised to return to PCP and could f/u with neurology as needed.

## 2020-07-05 NOTE — Telephone Encounter (Signed)
I called the pt and LVM with office number asking for call back. 

## 2020-07-05 NOTE — Telephone Encounter (Signed)
Pt states she has a Dr who is making the suggestion she comes off her seizure medication.  Pt is wanting to stay on her medication because it helps, Pt is asking for a call to discuss. Pt declined scheduling a f/u appointment to see Dr Lucia Gaskins and states she is not in need of a refill at this time, please call.

## 2020-07-06 NOTE — Telephone Encounter (Signed)
I tried to call the pt again today at [phone] but her VM was full.

## 2020-07-07 NOTE — Telephone Encounter (Signed)
Pt returned call. Please call back when office is open.

## 2020-07-10 ENCOUNTER — Ambulatory Visit (INDEPENDENT_AMBULATORY_CARE_PROVIDER_SITE_OTHER): Payer: Medicare Other | Admitting: Orthopedic Surgery

## 2020-07-10 ENCOUNTER — Encounter: Payer: Self-pay | Admitting: Orthopedic Surgery

## 2020-07-10 ENCOUNTER — Other Ambulatory Visit: Payer: Self-pay

## 2020-07-10 VITALS — BP 120/70 | HR 87 | Ht 62.0 in | Wt 204.2 lb

## 2020-07-10 DIAGNOSIS — G5602 Carpal tunnel syndrome, left upper limb: Secondary | ICD-10-CM

## 2020-07-10 NOTE — Telephone Encounter (Signed)
Spoke with patient and advised at last appointment September 2020 Dr. Lucia Gaskins recommended patient remain on the Depakote. The patient stated she has tried to come off the medication for 2 to 3 days in the past but was biting her tongue when she tried to swallow saliva so she prefers to stay on the medication. Patient did not feel like she needed an appointment to discuss at this time. She verbalized appreciation for the call.

## 2020-07-10 NOTE — Progress Notes (Signed)
Chief Complaint  Patient presents with  . Hand Pain    Left hand and all 4 fingers without the thumb, Patient reports been going on for months, She denies any numbness or pain or tinging from the arm or palm of hand.     54 year old female presents with 6-week history of excluding left arm.  She denies any trauma.  Past medical history  Past Medical History:  Diagnosis Date  . Acute respiratory failure with hypoxia (HCC) 10/18/2014  . Asthma   . Cholecystitis with cholelithiasis 07/12/2014  . Cholelithiasis with acute cholecystitis 07/13/2014  . DDD (degenerative disc disease), lumbar   . Depression   . HTN (hypertension)   . Hyperlipidemia   . Rectal bleed 11/13/2011  . Seizures (HCC)    remote  . Stroke [facility]) 1990   chronic right hemiparesis    Review of systems negative for provocative issues no fever chills no weakness  .vs  BP 120/70   Pulse 87   Ht 5\' 2"  (1.575 m)   Wt 204 lb 3.2 oz (92.6 kg)   BMI 37.35 kg/m  She is awake and alert she is oriented her mood is pleasant exam appearance is normal seems a little disheveled  Both hands are examined there is no atrophy normal sensation on the right decreased sensation on the left consistent carpal tunnel syndrome flexor tendons all questions have: Capillary refill all extremities  Impression left carpal tunnel syndrome She was already treated with gabapentin redistributing recommend she stop that  Recommend bracing and vitamin B6 twice a day consider carpal tunnel release if there is no improvement

## 2020-07-10 NOTE — Patient Instructions (Addendum)
Vitamin B6 100 mg twice a day for 6 weeks    Preventing Carpal Tunnel Syndrome  Carpal tunnel syndrome is a condition that causes pain, numbness, and weakness in the wrist, hand, and fingers. The carpal tunnel is a narrow, rigid space in the wrist. Tendons and the median nerve pass through the carpal tunnel. The median nerve is the main nerve in the hand. The median nerve gives sensation or feeling to the thumb, the muscles at the base of the thumb, and the first three fingers. Carpal tunnel syndrome happens when the median nerve gets squeezed in the area where it passes through the carpal tunnel. In some cases, it may not be possible to prevent carpal tunnel syndrome. However, you can take steps to relieve pressure on your wrist and reduce your risk of developing this condition. How can carpal tunnel syndrome affect me? Carpal tunnel syndrome can affect your ability to do jobs or activities that involve hand, wrist, and finger action. It can cause symptoms such as:  Pain in the wrist, hand, and fingers.  Burning, tingling, or numbness in the affected area.  A weak feeling in your hands. You may have trouble grabbing and holding items. Symptoms may get worse over time. For some people, symptoms get worse at night. What can increase my risk? The following factors may make you more likely to develop this condition:  Having a job that requires you to repeatedly or forcefully bend or move your wrist, or a job that requires you to use tools that vibrate. This may include jobs that involve using computers, working on an [employer], or working with power tools such as Radiographer, therapeutic.  Being a woman.  Having a family history of the condition.  Having certain conditions, such as: ? Diabetes. ? Pregnancy. ? Obesity. ? Thyroid disease. ? Rheumatoid arthritis. What actions can I take to help prevent carpal tunnel syndrome?  Avoid making repetitive or forceful hand and wrist motions that  cause your wrist to bend or get stiff or painful.  Take frequent breaks, about every 30 minutes, if you use your hands and wrists for many hours at a time.  Avoid sitting for long stretches of time. Try to get up and move every 30 minutes. Stretch your hands and fingers often to increase blood flow and relieve tension.  Keep your wrists in the natural position when using a computer keyboard or mouse. Do not bend your wrists downward or sideways. Arms and shoulders should be relaxed with elbows at your sides.  If you use your hands and wrists for many hours at work, make changes to your work space to ease pressure on your wrists. You may want to use: ? A padded wrist rest for computer work. Use this to lightly rest your wrist and hands when you are not actively keying. ? A keyboard at a height in which your wrists are straight when typing. You may need to flatten the keyboard or even tilt it away from you. ? Hand tools with padded handles or work gloves with padding to reduce vibrations.  Talk to your health provider about wearing a wrist brace or support. This will not prevent carpal tunnel syndrome but it may keep it from getting worse. A wrist brace may help reduce bending and stress.  Closely manage any medical conditions you have that can put you at risk for carpal tunnel syndrome. Have your blood sugar checked to make sure you are not developing diabetes. If you  have diabetes, work with your health care provider to keep your blood sugar under control.  Physical activity and exercise may help with this condition. Some people find yoga or aerobic exercise helpful.      Where to find more information  [employer] of Neurological Disorders and Stroke: BasicFM.no  American Academy of Family Physicians: Hydrologist.org Contact a health care provider if:  You have numbness or tingling in your wrist, hand, or fingers.  You have pain or a burning sensation in your wrist, hand, or  fingers.  Pain, tingling, or burning wakes you up at night.  Your hand becomes weak and clumsy.  You frequently drop objects.  You are unable to use your wrists and hands without pain. Summary  Carpal tunnel syndrome is a condition that causes pain, numbness, and weakness in the wrist, hand, and fingers.  You can take steps to relieve pressure on your wrist and reduce your risk of developing this condition.  Avoid making repetitive hand and wrist motions that cause your wrist to get stiff or painful.  If you use your hands and wrists for many hours at work, you may want to make changes to your work space to ease pressure on your wrists.  Take frequent breaks to stretch your hands and fingers. This information is not intended to replace advice given to you by your health care provider. Make sure you discuss any questions you have with your health care provider. Document Revised: 09/02/2019 Document Reviewed: 07/29/2019 Elsevier Patient Education  2021 Elsevier Inc.  Carpal Tunnel Syndrome  Carpal tunnel syndrome is a condition that causes pain, numbness, and weakness in your hand and fingers. The carpal tunnel is a narrow area located on the palm side of your wrist. Repeated wrist motion or certain diseases may cause swelling within the tunnel. This swelling pinches the main nerve in the wrist. The main nerve in the wrist is called the median nerve. What are the causes? This condition may be caused by:  Repeated and forceful wrist and hand motions.  Wrist injuries.  Arthritis.  A cyst or tumor in the carpal tunnel.  Fluid buildup during pregnancy.  Use of tools that vibrate. Sometimes the cause of this condition is not known. What increases the risk? The following factors may make you more likely to develop this condition:  Having a job that requires you to repeatedly or forcefully move your wrist or hand or requires you to use tools that vibrate. This may include jobs  that involve using computers, working on an [employer], or working with power tools such as Radiographer, therapeutic.  Being a woman.  Having certain conditions, such as: ? Diabetes. ? Obesity. ? An underactive thyroid (hypothyroidism). ? Kidney failure. ? Rheumatoid arthritis. What are the signs or symptoms? Symptoms of this condition include:  A tingling feeling in your fingers, especially in your thumb, index, and middle fingers.  Tingling or numbness in your hand.  An aching feeling in your entire arm, especially when your wrist and elbow are bent for a long time.  Wrist pain that goes up your arm to your shoulder.  Pain that goes down into your palm or fingers.  A weak feeling in your hands. You may have trouble grabbing and holding items. Your symptoms may feel worse during the night. How is this diagnosed? This condition is diagnosed with a medical history and physical exam. You may also have tests, including:  Electromyogram (EMG). This test measures electrical signals sent  by your nerves into the muscles.  Nerve conduction study. This test measures how well electrical signals pass through your nerves.  Imaging tests, such as X-rays, ultrasound, and MRI. These tests check for possible causes of your condition. How is this treated? This condition may be treated with:  Lifestyle changes. It is important to stop or change the activity that caused your condition.  Doing exercise and activities to strengthen and stretch your muscles and tendons (physical therapy).  Making lifestyle changes to help with your condition and learning how to do your daily activities safely (occupational therapy).  Medicines for pain and inflammation. This may include medicine that is injected into your wrist.  A wrist splint or brace.  Surgery. Follow these instructions at home: If you have a splint or brace:  Wear the splint or brace as told by your health care provider. Remove it only as  told by your health care provider.  Loosen the splint or brace if your fingers tingle, become numb, or turn cold and blue.  Keep the splint or brace clean.  If the splint or brace is not waterproof: ? Do not let it get wet. ? Cover it with a watertight covering when you take a bath or shower. Managing pain, stiffness, and swelling If directed, put ice on the painful area. To do this:  If you have a removeable splint or brace, remove it as told by your health care provider.  Put ice in a plastic bag.  Place a towel between your skin and the bag or between the splint or brace and the bag.  Leave the ice on for 20 minutes, 2-3 times a day. Do not fall asleep with the cold pack on your skin.  Remove the ice if your skin turns bright red. This is very important. If you cannot feel pain, heat, or cold, you have a greater risk of damage to the area. Move your fingers often to reduce stiffness and swelling.   General instructions  Take over-the-counter and prescription medicines only as told by your health care provider.  Rest your wrist and hand from any activity that may be causing your pain. If your condition is work related, talk with your employer about changes that can be made, such as getting a wrist pad to use while typing.  Do any exercises as told by your health care provider, physical therapist, or occupational therapist.  Keep all follow-up visits. This is important. Contact a health care provider if:  You have new symptoms.  Your pain is not controlled with medicines.  Your symptoms get worse. Get help right away if:  You have severe numbness or tingling in your wrist or hand. Summary  Carpal tunnel syndrome is a condition that causes pain, numbness, and weakness in your hand and fingers.  It is usually caused by repeated wrist motions.  Lifestyle changes and medicines are used to treat carpal tunnel syndrome. Surgery may be recommended.  Follow your health care  provider's instructions about wearing a splint, resting from activity, keeping follow-up visits, and calling for help. This information is not intended to replace advice given to you by your health care provider. Make sure you discuss any questions you have with your health care provider. Document Revised: 07/29/2019 Document Reviewed: 07/29/2019 Elsevier Patient Education  2021 [employer].

## 2020-07-17 ENCOUNTER — Telehealth: Payer: Self-pay

## 2020-07-17 ENCOUNTER — Other Ambulatory Visit: Payer: Self-pay | Admitting: Family Medicine

## 2020-07-17 ENCOUNTER — Other Ambulatory Visit: Payer: Self-pay | Admitting: Internal Medicine

## 2020-07-17 DIAGNOSIS — J4540 Moderate persistent asthma, uncomplicated: Secondary | ICD-10-CM

## 2020-07-17 NOTE — Telephone Encounter (Signed)
Rx sent 

## 2020-07-17 NOTE — Telephone Encounter (Signed)
Patient called need med refill  levalbuterol [facility] HFA) 45 MCG/ACT inhaler   Pharmacy: [facility] [place]

## 2020-07-18 ENCOUNTER — Other Ambulatory Visit: Payer: Self-pay | Admitting: Family Medicine

## 2020-07-18 DIAGNOSIS — J4540 Moderate persistent asthma, uncomplicated: Secondary | ICD-10-CM

## 2020-07-24 ENCOUNTER — Other Ambulatory Visit: Payer: Self-pay | Admitting: Family Medicine

## 2020-08-09 ENCOUNTER — Telehealth: Payer: Self-pay | Admitting: Family Medicine

## 2020-08-09 NOTE — Telephone Encounter (Signed)
Left message for patient to call back and schedule Medicare Annual Wellness Visit (AWV) either virtually or in office.   AWV-S PER [place] 09/29/12   please schedule at anytime with [facility]  health coach  This should be a 40 minute visit.

## 2020-08-11 ENCOUNTER — Ambulatory Visit: Payer: Medicare Other | Admitting: Internal Medicine

## 2020-08-21 ENCOUNTER — Ambulatory Visit (INDEPENDENT_AMBULATORY_CARE_PROVIDER_SITE_OTHER): Payer: Medicare Other | Admitting: Orthopedic Surgery

## 2020-08-21 ENCOUNTER — Other Ambulatory Visit: Payer: Self-pay

## 2020-08-21 DIAGNOSIS — G5602 Carpal tunnel syndrome, left upper limb: Secondary | ICD-10-CM | POA: Diagnosis not present

## 2020-08-21 NOTE — Progress Notes (Signed)
Follow-up for presumptive carpal tunnel treated with vitamin B6 and bracing  Chief Complaint  Patient presents with  . Follow-up    Recheck on CTS, left wrist.    She says she is a lot better  No numbness or tingling full range of motion  Continue bracing at night follow-up as needed if it gets worse  Encounter Diagnosis  Name Primary?  . Carpal tunnel syndrome of left wrist Yes

## 2020-08-21 NOTE — Patient Instructions (Signed)
Brace at night

## 2020-09-01 ENCOUNTER — Other Ambulatory Visit: Payer: Self-pay | Admitting: Internal Medicine

## 2020-09-01 DIAGNOSIS — J4540 Moderate persistent asthma, uncomplicated: Secondary | ICD-10-CM

## 2020-09-19 ENCOUNTER — Ambulatory Visit: Payer: Medicare Other | Admitting: Nurse Practitioner

## 2020-09-20 ENCOUNTER — Other Ambulatory Visit: Payer: Self-pay

## 2020-09-20 ENCOUNTER — Ambulatory Visit (INDEPENDENT_AMBULATORY_CARE_PROVIDER_SITE_OTHER): Payer: Medicare Other | Admitting: Nurse Practitioner

## 2020-09-20 ENCOUNTER — Other Ambulatory Visit: Payer: Self-pay | Admitting: Internal Medicine

## 2020-09-20 ENCOUNTER — Encounter: Payer: Self-pay | Admitting: Nurse Practitioner

## 2020-09-20 VITALS — BP 126/82 | HR 68 | Temp 97.8°F | Ht 62.0 in | Wt 206.0 lb

## 2020-09-20 DIAGNOSIS — J4540 Moderate persistent asthma, uncomplicated: Secondary | ICD-10-CM | POA: Diagnosis not present

## 2020-09-20 DIAGNOSIS — R5383 Other fatigue: Secondary | ICD-10-CM

## 2020-09-20 DIAGNOSIS — E559 Vitamin D deficiency, unspecified: Secondary | ICD-10-CM

## 2020-09-20 DIAGNOSIS — R079 Chest pain, unspecified: Secondary | ICD-10-CM

## 2020-09-20 DIAGNOSIS — I10 Essential (primary) hypertension: Secondary | ICD-10-CM

## 2020-09-20 NOTE — Assessment & Plan Note (Signed)
-  checking labs today- CBC, CMP, TSH, Vit D, B12, and autoimmune panel; had previous lyme that was negative -has chest pain at times -referral to cardiology

## 2020-09-20 NOTE — Assessment & Plan Note (Signed)
-  lungs sound great today -no change in meds

## 2020-09-20 NOTE — Assessment & Plan Note (Addendum)
BP Readings from Last 3 Encounters:  09/20/20 126/82  07/10/20 120/70  04/10/20 133/79   -no med changes today

## 2020-09-20 NOTE — Patient Instructions (Signed)
Please have labs drawn today

## 2020-09-20 NOTE — Assessment & Plan Note (Signed)
-  sporadic; occurs more when she is playing with her grandchildren -has associated SOB -referral to cardiology -she has no CP today and is in no acute distress

## 2020-09-20 NOTE — Progress Notes (Signed)
Established Patient Office Visit  Subjective:  Patient ID: Patty [name], female    DOB: [birth year]  Age: 55 y.o. MRN: [id number]  CC:  Chief Complaint  Patient presents with   Fatigue    Ongoing x1 month, worse in the morning trying to wake up for the day.    HPI Patty [name] presents for fatigue x 1 month. She states that she has some ongoing lower back pain.  She states that she gets some chest pain when her grandchildren are around and splaying with them.  She takes aleve sometimes, and that helps.  She states that she has some SOB with her CP.  Past Medical History:  Diagnosis Date   Acute respiratory failure with hypoxia ([place]) 10/18/2014   Asthma    Cholecystitis with cholelithiasis 07/12/2014   Cholelithiasis with acute cholecystitis 07/13/2014   DDD (degenerative disc disease), lumbar    Depression    HTN (hypertension)    Hyperlipidemia    Rectal bleed 11/13/2011   Seizures ([place])    remote   Stroke [facility]) 1990   chronic right hemiparesis    Past Surgical History:  Procedure Laterality Date   CESAREAN SECTION     CHOLECYSTECTOMY N/A 07/13/2014   Procedure: LAPAROSCOPIC CONVERTED TO OPEN  CHOLECYSTECTOMY ;  Surgeon: Armandina Gemma, MD;  Location: [facility];  Service: General;  Laterality: N/A;   COLONOSCOPY  12/24/2011   Procedure: COLONOSCOPY;  Surgeon: Danie Binder, MD;  Location: [facility];  Service: Endoscopy;  Laterality: N/A;  2:00   GALLBLADDER SURGERY     [name]   PARTIAL HYSTERECTOMY     heavy bleeding    Family History  Problem Relation Age of Onset   Stroke Father    Diabetes Other    Colon cancer Neg Hx    Liver disease Neg Hx     Social History   Socioeconomic History   Marital status: Divorced    Spouse name: Not on file   Number of children: 2   Years of education: Not on file   Highest education level: Not on file  Occupational History   Occupation: disabled    Employer: UNEMPLOYED  Tobacco Use   Smoking status:  Never   Smokeless tobacco: Never  Vaping Use   Vaping Use: Never used  Substance and Sexual Activity   Alcohol use: Yes    Alcohol/week: 2.0 standard drinks    Types: 2 Standard drinks or equivalent per week    Comment: couple of drinks on weekends   Drug use: No   Sexual activity: Not Currently  Other Topics Concern   Not on file  Social History Narrative   On disability. Used to work at [employer]/Worked at [employer] Retired now   [employer]    Two children.    Daughter: 38    Son: 68   Has seven grand children.    Enjoys reading      Diet: eats lots of fruits and veggies, occasional meat: chicken mostly, likes pasta    Caffeine: Tea-green lipton   Water: about 5 cups daily      Wears seatbelt   Smoke detectors      Social Determinants of Health   Financial Resource Strain: Not on file  Food Insecurity: Not on file  Transportation Needs: Not on file  Physical Activity: Not on file  Stress: Not on file  Social Connections: Not on file  Intimate Partner Violence: Not on file  Outpatient Medications Prior to Visit  Medication Sig Dispense Refill   albuterol (ACCUNEB) 0.63 MG/3ML nebulizer solution Take 3 mLs (0.63 mg total) by nebulization every 6 (six) hours as needed for wheezing or shortness of breath. 3 mL 3   albuterol (VENTOLIN HFA) 108 (90 Base) MCG/ACT inhaler Inhale 1-2 puffs into the lungs every 6 (six) hours as needed for wheezing or shortness of breath. 8 g 1   aspirin EC 81 MG tablet Take 1 tablet (81 mg total) by mouth daily.     budesonide-formoterol (SYMBICORT) 80-4.5 MCG/ACT inhaler INHALE 2 PUFFS BY MOUTH EVERY 12 HOURS 10.2 g 2   diclofenac sodium (VOLTAREN) 1 % GEL Apply 1 application topically 2 (two) times a day.     divalproex (DEPAKOTE) 250 MG DR tablet TAKE 1 TABLET BY MOUTH THREE TIMES DAILY 270 tablet 2   escitalopram (LEXAPRO) 10 MG tablet TAKE 1 TABLET(10 MG) BY MOUTH DAILY 90 tablet 1   gabapentin (NEURONTIN) 100 MG capsule Take  100 mg by mouth 3 (three) times daily.     levalbuterol (XOPENEX HFA) 45 MCG/ACT inhaler INHALE 1 TO 2 PUFFS BY MOUTH EVERY 4 TO 6 HOURS AS NEEDED FOR WHEEZING 15 g 0   meloxicam (MOBIC) 7.5 MG tablet TAKE 1 TABLET(7.5 MG) BY MOUTH DAILY 30 tablet 5   montelukast (SINGULAIR) 10 MG tablet Take 1 tablet (10 mg total) by mouth at bedtime. 90 tablet 1   Nebulizers (MY MDI PORTABLE NEBULISER) MISC 1 application by Does not apply route as needed. 1 each 0   oxybutynin (DITROPAN-XL) 5 MG 24 hr tablet Take 5 mg by mouth daily.     pravastatin (PRAVACHOL) 20 MG tablet TAKE 1 TABLET(20 MG) BY MOUTH DAILY 90 tablet 2   UNABLE TO FIND 1 application by Other route daily. Wrist splint for carpal tunnel syndrome: Apply it daily. 1 each 0   verapamil (CALAN) 120 MG tablet Take 1 tablet (120 mg total) by mouth daily. 90 tablet 1   No facility-administered medications prior to visit.    No Known Allergies  ROS Review of Systems  Constitutional:  Positive for fatigue. Negative for chills and fever.  Respiratory:  Positive for chest tightness and shortness of breath. Negative for cough and wheezing.   Cardiovascular:  Positive for chest pain. Negative for palpitations and leg swelling.  Gastrointestinal: Negative.   Musculoskeletal:  Positive for back pain.  Psychiatric/Behavioral: Negative.       Objective:    Physical Exam Constitutional:      Appearance: Normal appearance. She is obese.  Cardiovascular:     Rate and Rhythm: Normal rate and regular rhythm.     Pulses: Normal pulses.     Heart sounds: Normal heart sounds.  Pulmonary:     Effort: Pulmonary effort is normal.     Breath sounds: Normal breath sounds.  Neurological:     General: No focal deficit present.     Mental Status: She is alert and oriented to person, place, and time.  Psychiatric:        Mood and Affect: Mood normal.        Behavior: Behavior normal.        Thought Content: Thought content normal.        Judgment:  Judgment normal.    BP 126/82 (BP Location: Right Arm, Patient Position: Sitting, Cuff Size: Large)   Pulse 68   Temp 97.8 F (36.6 C) (Temporal)   Ht 5\' 2"  (1.575 m)  Wt 206 lb (93.4 kg)   SpO2 95%   BMI 37.68 kg/m  Wt Readings from Last 3 Encounters:  09/20/20 206 lb (93.4 kg)  07/10/20 204 lb 3.2 oz (92.6 kg)  04/10/20 210 lb (95.3 kg)     Health Maintenance Due  Topic Date Due   Pneumococcal Vaccine 79-67 Years old (1 - PCV) Never done   Hepatitis C Screening  Never done   TETANUS/TDAP  Never done   Zoster Vaccines- Shingrix (1 of 2) Never done   PAP SMEAR-Modifier  09/23/2016   COVID-19 Vaccine (2 - Moderna risk series) 06/14/2020    There are no preventive care reminders to display for this patient.  Lab Results  Component Value Date   TSH 1.770 04/10/2020   Lab Results  Component Value Date   WBC 7.8 04/10/2020   HGB 14.3 04/10/2020   HCT 41.9 04/10/2020   MCV 91 04/10/2020   PLT 276 04/10/2020   Lab Results  Component Value Date   NA 142 09/08/2018   K 3.5 09/08/2018   CO2 27 09/08/2018   GLUCOSE 92 09/08/2018   BUN 14 09/08/2018   CREATININE 0.89 09/08/2018   BILITOT 0.4 09/08/2018   ALKPHOS 87 10/19/2014   AST 14 09/08/2018   ALT 16 09/08/2018   PROT 7.4 09/08/2018   ALBUMIN 3.5 10/19/2014   CALCIUM 9.5 09/08/2018   ANIONGAP 7 03/07/2016   Lab Results  Component Value Date   CHOL 170 09/09/2018   Lab Results  Component Value Date   HDL 47 (L) 09/09/2018   Lab Results  Component Value Date   LDLCALC 101 (H) 09/09/2018   Lab Results  Component Value Date   TRIG 120 09/09/2018   Lab Results  Component Value Date   CHOLHDL 3.6 09/09/2018   Lab Results  Component Value Date   HGBA1C 5.7 (H) 09/08/2018      Assessment & Plan:   Problem List Items Addressed This Visit       Cardiovascular and Mediastinum   Essential hypertension    BP Readings from Last 3 Encounters:  09/20/20 126/82  07/10/20 120/70  04/10/20 133/79   -no med changes today         Respiratory   Moderate persistent asthma without complication    -lungs sound great today -no change in meds         Other   Fatigue - Primary    -checking labs today- CBC, CMP, TSH, Vit D, B12, and autoimmune panel; had previous lyme that was negative -has chest pain at times -referral to cardiology       Relevant Orders   CBC with Differential/Platelet   CMP14+EGFR   VITAMIN D 25 Hydroxy (Vit-D Deficiency, Fractures)   TSH   C-reactive protein   Sed Rate (ESR)   Antinuclear Antib (ANA)   Rheumatoid Factor   Chest pain    -sporadic; occurs more when she is playing with her grandchildren -has associated SOB -referral to cardiology -she has no CP today and is in no acute distress       Relevant Orders   Ambulatory referral to Cardiology   Other Visit Diagnoses     Vitamin D deficiency, unspecified        Relevant Orders   VITAMIN D 25 Hydroxy (Vit-D Deficiency, Fractures)       No orders of the defined types were placed in this encounter.   Follow-up: Return if symptoms worsen or fail to improve.  Noreene Larsson, NP

## 2020-09-21 DIAGNOSIS — R5383 Other fatigue: Secondary | ICD-10-CM | POA: Diagnosis not present

## 2020-09-21 DIAGNOSIS — E559 Vitamin D deficiency, unspecified: Secondary | ICD-10-CM | POA: Diagnosis not present

## 2020-09-22 NOTE — Progress Notes (Signed)
ANA is still left.

## 2020-09-22 NOTE — Progress Notes (Signed)
Her labs look good so far, but the autoimmune panel hasn't completely resulted yet.

## 2020-09-23 LAB — CBC WITH DIFFERENTIAL/PLATELET
Basophils Absolute: 0.1 10*3/uL (ref 0.0–0.2)
Basos: 1 %
EOS (ABSOLUTE): 0.2 10*3/uL (ref 0.0–0.4)
Eos: 3 %
Hematocrit: 40.5 % (ref 34.0–46.6)
Hemoglobin: 13.4 g/dL (ref 11.1–15.9)
Immature Grans (Abs): 0 10*3/uL (ref 0.0–0.1)
Immature Granulocytes: 0 %
Lymphocytes Absolute: 3.3 10*3/uL — ABNORMAL HIGH (ref 0.7–3.1)
Lymphs: 43 %
MCH: 30.3 pg (ref 26.6–33.0)
MCHC: 33.1 g/dL (ref 31.5–35.7)
MCV: 92 fL (ref 79–97)
Monocytes Absolute: 0.5 10*3/uL (ref 0.1–0.9)
Monocytes: 7 %
Neutrophils Absolute: 3.6 10*3/uL (ref 1.4–7.0)
Neutrophils: 46 %
Platelets: 237 10*3/uL (ref 150–450)
RBC: 4.42 x10E6/uL (ref 3.77–5.28)
RDW: 12.7 % (ref 11.7–15.4)
WBC: 7.7 10*3/uL (ref 3.4–10.8)

## 2020-09-23 LAB — CMP14+EGFR
ALT: 18 IU/L (ref 0–32)
AST: 13 IU/L (ref 0–40)
Albumin/Globulin Ratio: 1.6 (ref 1.2–2.2)
Albumin: 4.1 g/dL (ref 3.8–4.9)
Alkaline Phosphatase: 97 IU/L (ref 44–121)
BUN/Creatinine Ratio: 26 — ABNORMAL HIGH (ref 9–23)
BUN: 19 mg/dL (ref 6–24)
Bilirubin Total: <0.2 mg/dL (ref 0.0–1.2)
CO2: 22 mmol/L (ref 20–29)
Calcium: 9.3 mg/dL (ref 8.7–10.2)
Chloride: 104 mmol/L (ref 96–106)
Creatinine, Ser: 0.73 mg/dL (ref 0.57–1.00)
Globulin, Total: 2.6 g/dL (ref 1.5–4.5)
Glucose: 88 mg/dL (ref 65–99)
Potassium: 4.4 mmol/L (ref 3.5–5.2)
Sodium: 141 mmol/L (ref 134–144)
Total Protein: 6.7 g/dL (ref 6.0–8.5)
eGFR: 96 mL/min/{1.73_m2} (ref >59)

## 2020-09-23 LAB — ANA: Anti Nuclear Antibody (ANA): NEGATIVE

## 2020-09-23 LAB — VITAMIN D 25 HYDROXY (VIT D DEFICIENCY, FRACTURES): Vit D, 25-Hydroxy: 39.8 ng/mL (ref 30.0–100.0)

## 2020-09-23 LAB — TSH: TSH: 1.84 u[IU]/mL (ref 0.450–4.500)

## 2020-09-23 LAB — C-REACTIVE PROTEIN: CRP: 4 mg/L (ref 0–10)

## 2020-09-23 LAB — SEDIMENTATION RATE: Sed Rate: 42 mm/hr — ABNORMAL HIGH (ref 0–40)

## 2020-09-23 LAB — RHEUMATOID FACTOR: Rheumatoid fact SerPl-aCnc: 10.4 IU/mL (ref <14.0)

## 2020-09-24 NOTE — Progress Notes (Signed)
ESR was slightly elevated. I don't think that an autoimmune condition is likely. If cardiology doesn't find anything and fatigue continues, we may repeat labs in 1-2 months to see if a rheumatology appointment is warranted.

## 2020-09-25 NOTE — Progress Notes (Signed)
Left message

## 2020-09-26 ENCOUNTER — Telehealth: Payer: Self-pay

## 2020-09-26 NOTE — Telephone Encounter (Signed)
Patient returning call about lab results. Call back # [phone]

## 2020-10-09 ENCOUNTER — Other Ambulatory Visit: Payer: Self-pay | Admitting: Internal Medicine

## 2020-10-09 DIAGNOSIS — J4540 Moderate persistent asthma, uncomplicated: Secondary | ICD-10-CM

## 2020-10-10 ENCOUNTER — Other Ambulatory Visit: Payer: Self-pay

## 2020-10-10 DIAGNOSIS — I10 Essential (primary) hypertension: Secondary | ICD-10-CM

## 2020-10-10 MED ORDER — VERAPAMIL HCL 120 MG PO TABS
120.0000 mg | ORAL_TABLET | Freq: Every day | ORAL | 1 refills | Status: DC
Start: 2020-10-10 — End: 2021-07-16

## 2020-10-23 ENCOUNTER — Other Ambulatory Visit: Payer: Self-pay | Admitting: Family Medicine

## 2020-10-25 ENCOUNTER — Other Ambulatory Visit: Payer: Self-pay | Admitting: Internal Medicine

## 2020-10-25 DIAGNOSIS — J4540 Moderate persistent asthma, uncomplicated: Secondary | ICD-10-CM

## 2020-11-07 ENCOUNTER — Other Ambulatory Visit: Payer: Self-pay | Admitting: Internal Medicine

## 2020-11-07 DIAGNOSIS — J4540 Moderate persistent asthma, uncomplicated: Secondary | ICD-10-CM

## 2020-11-17 ENCOUNTER — Ambulatory Visit: Payer: Medicare Other | Admitting: Cardiovascular Disease

## 2020-11-20 ENCOUNTER — Ambulatory Visit
Admission: EM | Admit: 2020-11-20 | Discharge: 2020-11-20 | Disposition: A | Discharge status: Home / Self Care | Payer: Medicare Other | Attending: Family Medicine | Admitting: Family Medicine

## 2020-11-20 ENCOUNTER — Encounter: Payer: Self-pay | Admitting: *Deleted

## 2020-11-20 ENCOUNTER — Other Ambulatory Visit: Payer: Self-pay

## 2020-11-20 DIAGNOSIS — T63461A Toxic effect of venom of wasps, accidental (unintentional), initial encounter: Secondary | ICD-10-CM

## 2020-11-20 DIAGNOSIS — R21 Rash and other nonspecific skin eruption: Secondary | ICD-10-CM

## 2020-11-20 MED ORDER — PREDNISONE 20 MG PO TABS: 40.0000 mg | ORAL_TABLET | Freq: Every day | ORAL | 0 refills | Status: AC

## 2020-11-20 MED ORDER — DEXAMETHASONE SODIUM PHOSPHATE 10 MG/ML IJ SOLN
10.0000 mg | Freq: Once | INTRAMUSCULAR | Status: AC
  Administered 2020-11-20: 10 mg via INTRAMUSCULAR

## 2020-11-20 MED ORDER — TRIAMCINOLONE ACETONIDE 0.025 % EX OINT: 1.0000 "application " | TOPICAL_OINTMENT | Freq: Three times a day (TID) | CUTANEOUS | 0 refills | Status: DC

## 2020-11-20 NOTE — Discharge Instructions (Addendum)
If symptoms worsen go to the Emergency department

## 2020-11-20 NOTE — ED Triage Notes (Signed)
Pt stung by wasp on Sunday and woke today with Swelling to Lt hand and redness to LT forearm . Pt reports itching to Lt arm.

## 2020-11-24 NOTE — ED Provider Notes (Signed)
[facility]    CSN: [id number] Arrival date & time: 11/20/20  1022      History   Chief Complaint Chief Complaint  Patient presents with   Insect Bite    HPI Patty [name] is a 55 y.o. female.   HPI Patient presents today with left hand and forearm swelling, redness which is itchy.  Reports being stung by wasp one day ago.  She has not taken any medication or applied any OTC topicals left extremity since wasp injury occurred  Past Medical History:  Diagnosis Date   Acute respiratory failure with hypoxia ([facility]) 10/18/2014   Asthma    Cholecystitis with cholelithiasis 07/12/2014   Cholelithiasis with acute cholecystitis 07/13/2014   DDD (degenerative disc disease), lumbar    Depression    HTN (hypertension)    Hyperlipidemia    Rectal bleed 11/13/2011   Seizures ([facility])    remote   Stroke [facility]) 1990   chronic right hemiparesis    Patient Active Problem List   Diagnosis Date Noted   Fatigue 09/20/2020   Chest pain 09/20/2020   Peripheral neuropathy 04/10/2020   Moderate persistent asthma without complication 08/31/2019   Mixed hyperlipidemia 12/09/2018   Morbid obesity due to excess calories ([facility]) 12/09/2018   High risk medication use 04/25/2017   Essential hypertension 10/18/2014   Seizure disorder ([facility]) 10/18/2014   Stroke ([facility]) 1990    Past Surgical History:  Procedure Laterality Date   CESAREAN SECTION     CHOLECYSTECTOMY N/A 07/13/2014   Procedure: LAPAROSCOPIC CONVERTED TO OPEN  CHOLECYSTECTOMY ;  Surgeon: Darnell Level, MD;  Location: [facility];  Service: General;  Laterality: N/A;   COLONOSCOPY  12/24/2011   Procedure: COLONOSCOPY;  Surgeon: West Bali, MD;  Location[facility];  Service: Endoscopy;  Laterality: N/A;  2:00   GALLBLADDER SURGERY     [name] /[place]   PARTIAL HYSTERECTOMY     heavy bleeding    OB History   No obstetric history on file.      Home Medications    Prior to Admission medications   Medication Sig  Start Date End Date Taking? Authorizing Provider  predniSONE (DELTASONE) 20 MG tablet Take 2 tablets (40 mg total) by mouth daily with breakfast for 5 days. 11/21/20 11/26/20 Yes Bing Neighbors, FNP  triamcinolone (KENALOG) 0.025 % ointment Apply 1 application topically 3 (three) times daily. 11/20/20  Yes Bing Neighbors, FNP  albuterol (ACCUNEB) 0.63 MG/3ML nebulizer solution Take 3 mLs (0.63 mg total) by nebulization every 6 (six) hours as needed for wheezing or shortness of breath. 03/02/20   Freddy Finner, NP  albuterol (VENTOLIN HFA) 108 (90 Base) MCG/ACT inhaler Inhale 1-2 puffs into the lungs every 6 (six) hours as needed for wheezing or shortness of breath. 08/31/19   Freddy Finner, NP  aspirin EC 81 MG tablet Take 1 tablet (81 mg total) by mouth daily. 04/25/17   Aliene Beams, MD  budesonide-formoterol (SYMBICORT) 80-4.5 MCG/ACT inhaler INHALE 2 PUFFS BY MOUTH EVERY 12 HOURS 10/23/20   Kerri Perches, MD  diclofenac sodium (VOLTAREN) 1 % GEL Apply 1 application topically 2 (two) times a day. 08/07/18   [provider]  divalproex (DEPAKOTE) 250 MG DR tablet TAKE 1 TABLET BY MOUTH THREE TIMES DAILY 06/09/20   Anson Fret, MD  escitalopram (LEXAPRO) 10 MG tablet TAKE 1 TABLET(10 MG) BY MOUTH DAILY 11/11/17   Aliene Beams, MD  gabapentin (NEURONTIN) 100 MG capsule Take  100 mg by mouth 3 (three) times daily. 05/14/20   [provider]  levalbuterol (XOPENEX HFA) 45 MCG/ACT inhaler INHALE 1 TO 2 PUFFS BY MOUTH EVERY 4 TO 6 HOURS AS NEEDED FOR WHEEZING 11/07/20   Anabel Halon, MD  meloxicam (MOBIC) 7.5 MG tablet TAKE 1 TABLET(7.5 MG) BY MOUTH DAILY 03/02/20   Vickki Hearing, MD  montelukast (SINGULAIR) 10 MG tablet Take 1 tablet (10 mg total) by mouth at bedtime. 08/31/19   Freddy Finner, NP  Nebulizers (MY MDI PORTABLE NEBULISER) MISC 1 application by Does not apply route as needed. 05/08/20   Anabel Halon, MD  oxybutynin (DITROPAN-XL) 5 MG 24 hr tablet Take 5  mg by mouth daily. 07/07/18   [provider]  pravastatin (PRAVACHOL) 20 MG tablet TAKE 1 TABLET(20 MG) BY MOUTH DAILY 05/15/20   Kerri Perches, MD  UNABLE TO FIND 1 application by Other route daily. Wrist splint for carpal tunnel syndrome: Apply it daily. 05/08/20   Anabel Halon, MD  verapamil (CALAN) 120 MG tablet Take 1 tablet (120 mg total) by mouth daily. 10/10/20   Heather Roberts, NP    Family History Family History  Problem Relation Age of Onset   Stroke Father    Diabetes Other    Colon cancer Neg Hx    Liver disease Neg Hx     Social History Social History   Tobacco Use   Smoking status: Never   Smokeless tobacco: Never  Vaping Use   Vaping Use: Never used  Substance Use Topics   Alcohol use: Yes    Alcohol/week: 2.0 standard drinks    Types: 2 Standard drinks or equivalent per week    Comment: couple of drinks on weekends   Drug use: No     Allergies   Bee venom   Review of Systems Review of Systems Pertinent negatives listed in HPI  Physical Exam Triage Vital Signs ED Triage Vitals  Enc Vitals Group     BP 11/20/20 1101 (!) 134/92     Pulse Rate 11/20/20 1101 85     Resp 11/20/20 1101 18     Temp 11/20/20 1101 98.7 F (37.1 C)     Temp src --      SpO2 11/20/20 1101 98 %     Weight --      Height --      Head Circumference --      Peak Flow --      Pain Score 11/20/20 1102 8     Pain Loc --      Pain Edu? --      Excl. in GC? --    No data found.  Updated Vital Signs BP (!) 134/92   Pulse 85   Temp 98.7 F (37.1 C)   Resp 18   SpO2 98%   Visual Acuity Right Eye Distance:   Left Eye Distance:   Bilateral Distance:    Right Eye Near:   Left Eye Near:    Bilateral Near:     Physical Exam Constitutional:      Appearance: She is obese.  HENT:     Mouth/Throat:     Mouth: Mucous membranes are moist.     Pharynx: Oropharynx is clear. Uvula midline.  Cardiovascular:     Rate and Rhythm: Normal rate and regular  rhythm.  Pulmonary:     Effort: Pulmonary effort is normal.     Breath sounds: Normal breath sounds  and air entry. No decreased air movement.  Musculoskeletal:     Cervical back: Full passive range of motion without pain and normal range of motion.  Neurological:     Mental Status: She is alert.  Psychiatric:        Attention and Perception: Attention normal.        Mood and Affect: Mood is anxious.        Behavior: Behavior is cooperative.     UC Treatments / Results  Labs (all labs ordered are listed, but only abnormal results are displayed) Labs Reviewed - No data to display  EKG   Radiology No results found.  Procedures Procedures (including critical care time)  Medications Ordered in UC Medications  dexamethasone (DECADRON) injection 10 mg (10 mg Intramuscular Given 11/20/20 1245)    Initial Impression / Assessment and Plan / UC Course  I have reviewed the triage vital signs and the nursing notes.  Pertinent labs & imaging results that were available during my care of the patient were reviewed by me and considered in my medical decision making (see chart for details).    Wasp sting resulting in Rash with nonspecific skin eruption  Decadron IM given here in clinic.  Prednisone start tomorrow ER Precautions given RTC PRN  Final Clinical Impressions(s) / UC Diagnoses   Final diagnoses:  Wasp sting, accidental or unintentional, initial encounter  Rash and nonspecific skin eruption     Discharge Instructions      If symptoms worsen go to the Emergency department    ED Prescriptions     Medication Sig Dispense Auth. Provider   predniSONE (DELTASONE) 20 MG tablet Take 2 tablets (40 mg total) by mouth daily with breakfast for 5 days. 10 tablet Bing Neighbors, FNP   triamcinolone (KENALOG) 0.025 % ointment Apply 1 application topically 3 (three) times daily. 90 g Bing Neighbors, FNP      PDMP not reviewed this encounter.   Bing Neighbors,  [place] 11/24/20 [phone]

## 2020-11-27 ENCOUNTER — Other Ambulatory Visit: Payer: Self-pay | Admitting: Internal Medicine

## 2020-11-27 DIAGNOSIS — J4540 Moderate persistent asthma, uncomplicated: Secondary | ICD-10-CM

## 2020-12-11 ENCOUNTER — Other Ambulatory Visit: Payer: Self-pay | Admitting: Internal Medicine

## 2020-12-11 DIAGNOSIS — J4540 Moderate persistent asthma, uncomplicated: Secondary | ICD-10-CM

## 2020-12-25 ENCOUNTER — Other Ambulatory Visit: Payer: Self-pay | Admitting: Family Medicine

## 2020-12-25 ENCOUNTER — Ambulatory Visit (INDEPENDENT_AMBULATORY_CARE_PROVIDER_SITE_OTHER): Payer: Medicare Other

## 2020-12-25 ENCOUNTER — Telehealth: Payer: Self-pay

## 2020-12-25 ENCOUNTER — Other Ambulatory Visit: Payer: Self-pay | Admitting: Nurse Practitioner

## 2020-12-25 ENCOUNTER — Other Ambulatory Visit: Payer: Self-pay

## 2020-12-25 VITALS — BP 110/77 | HR 76 | Temp 98.0°F | Ht 62.0 in | Wt 206.0 lb

## 2020-12-25 DIAGNOSIS — H539 Unspecified visual disturbance: Secondary | ICD-10-CM

## 2020-12-25 DIAGNOSIS — I10 Essential (primary) hypertension: Secondary | ICD-10-CM | POA: Diagnosis not present

## 2020-12-25 DIAGNOSIS — Z0000 Encounter for general adult medical examination without abnormal findings: Secondary | ICD-10-CM

## 2020-12-25 DIAGNOSIS — F339 Major depressive disorder, recurrent, unspecified: Secondary | ICD-10-CM

## 2020-12-25 MED ORDER — ESCITALOPRAM OXALATE 20 MG PO TABS: 20.0000 mg | ORAL_TABLET | Freq: Every day | ORAL | 3 refills | Status: DC

## 2020-12-25 NOTE — Progress Notes (Signed)
Subjective:   Patty [name] is a 55 y.o. female who presents for an Initial Medicare Annual Wellness Visit.  Review of Systems     Cardiac Risk Factors include: hypertension;dyslipidemia;sedentary lifestyle;obesity (BMI >30kg/m2)     Objective:    Today's Vitals   12/25/20 1426 12/25/20 1428  BP: 110/77   Pulse: 76   Temp: 98 F (36.7 C)   Weight: 206 lb (93.4 kg)   Height: 5\' 2"  (1.575 m)   PainSc:  5    Body mass index is 37.68 kg/m.  Advanced Directives 12/25/2020 07/06/2017 07/18/2016 03/07/2016 09/10/2015 11/24/2014 10/18/2014  Does Patient Have a Medical Advance Directive? No No No No No No No  Would patient like information on creating a medical advance directive? No - Patient declined No - Patient declined No - Patient declined Yes (ED - Information included in AVS) No - patient declined information No - patient declined information No - patient declined information    Current Medications (verified) Outpatient Encounter Medications as of 12/25/2020  Medication Sig   albuterol (ACCUNEB) 0.63 MG/3ML nebulizer solution Take 3 mLs (0.63 mg total) by nebulization every 6 (six) hours as needed for wheezing or shortness of breath.   aspirin EC 81 MG tablet Take 1 tablet (81 mg total) by mouth daily.   budesonide-formoterol (SYMBICORT) 80-4.5 MCG/ACT inhaler INHALE 2 PUFFS BY MOUTH EVERY 12 HOURS   diclofenac sodium (VOLTAREN) 1 % GEL Apply 1 application topically 2 (two) times a day.   divalproex (DEPAKOTE) 250 MG DR tablet TAKE 1 TABLET BY MOUTH THREE TIMES DAILY   escitalopram (LEXAPRO) 10 MG tablet TAKE 1 TABLET(10 MG) BY MOUTH DAILY   levalbuterol (XOPENEX HFA) 45 MCG/ACT inhaler INHALE 1 TO 2 PUFFS BY MOUTH EVERY 4 TO 6 HOURS AS NEEDED FOR WHEEZING   meloxicam (MOBIC) 7.5 MG tablet TAKE 1 TABLET(7.5 MG) BY MOUTH DAILY   montelukast (SINGULAIR) 10 MG tablet Take 1 tablet (10 mg total) by mouth at bedtime.   Nebulizers (MY MDI PORTABLE NEBULISER) MISC 1 application by Does not  apply route as needed.   pravastatin (PRAVACHOL) 20 MG tablet TAKE 1 TABLET(20 MG) BY MOUTH DAILY   triamcinolone (KENALOG) 0.025 % ointment Apply 1 application topically 3 (three) times daily.   UNABLE TO FIND 1 application by Other route daily. Wrist splint for carpal tunnel syndrome: Apply it daily.   verapamil (CALAN) 120 MG tablet Take 1 tablet (120 mg total) by mouth daily.   gabapentin (NEURONTIN) 100 MG capsule Take 100 mg by mouth 3 (three) times daily. (Patient not taking: Reported on 12/25/2020)   oxybutynin (DITROPAN-XL) 5 MG 24 hr tablet Take 5 mg by mouth daily. (Patient not taking: Reported on 12/25/2020)   No facility-administered encounter medications on file as of 12/25/2020.    Allergies (verified) Bee venom   History: Past Medical History:  Diagnosis Date   Acute respiratory failure with hypoxia (HCC) 10/18/2014   Asthma    Cholecystitis with cholelithiasis 07/12/2014   Cholelithiasis with acute cholecystitis 07/13/2014   DDD (degenerative disc disease), lumbar    Depression    HTN (hypertension)    Hyperlipidemia    Rectal bleed 11/13/2011   Seizures (HCC)    remote   Stroke [facility]) 1990   chronic right hemiparesis   Past Surgical History:  Procedure Laterality Date   CESAREAN SECTION     CHOLECYSTECTOMY N/A 07/13/2014   Procedure: LAPAROSCOPIC CONVERTED TO OPEN  CHOLECYSTECTOMY ;  Surgeon: 07/15/2014, MD;  Location: [facility] ORS;  Service: General;  Laterality: N/A;   COLONOSCOPY  12/24/2011   Procedure: COLONOSCOPY;  Surgeon: West Bali, MD;  Location: [facility];  Service: Endoscopy;  Laterality: N/A;  2:00   GALLBLADDER SURGERY     [name] /[place]   PARTIAL HYSTERECTOMY     heavy bleeding   Family History  Problem Relation Age of Onset   Stroke Father    Diabetes Other    Colon cancer Neg Hx    Liver disease Neg Hx    Social History   Socioeconomic History   Marital status: Divorced    Spouse name: Not on file   Number of children: 2    Years of education: Not on file   Highest education level: Not on file  Occupational History   Occupation: disabled    Employer: UNEMPLOYED  Tobacco Use   Smoking status: Never   Smokeless tobacco: Never  Vaping Use   Vaping Use: Never used  Substance and Sexual Activity   Alcohol use: Yes    Alcohol/week: 2.0 standard drinks    Types: 2 Standard drinks or equivalent per week    Comment: couple of drinks on weekends   Drug use: No   Sexual activity: Not Currently  Other Topics Concern   Not on file  Social History Narrative   On disability. Used to work at [employer]/Worked at [employer] Retired now   [employer].    Two children.    Daughter: 53    Son: 65   Has seven grand children.    Enjoys reading      Diet: eats lots of fruits and veggies, occasional meat: chicken mostly, likes pasta    Caffeine: Tea-green lipton   Water: about 5 cups daily      Wears seatbelt   Smoke detectors      Social Determinants of Health   Financial Resource Strain: Not on file  Food Insecurity: No Food Insecurity   Worried About Programme researcher, broadcasting/film/video in the Last Year: Never true   Barista in the Last Year: Never true  Transportation Needs: No Transportation Needs   Lack of Transportation (Medical): No   Lack of Transportation (Non-Medical): No  Physical Activity: Sufficiently Active   Days of Exercise per Week: 5 days   Minutes of Exercise per Session: 30 min  Stress: No Stress Concern Present   Feeling of Stress : Only a little  Social Connections: Moderately Integrated   Frequency of Communication with Friends and Family: More than three times a week   Frequency of Social Gatherings with Friends and Family: More than three times a week   Attends Religious Services: 1 to 4 times per year   Active Member of [employer] or Organizations: No   Attends Engineer, structural: 1 to 4 times per year   Marital Status: Divorced    Tobacco Counseling Counseling given: Not  Answered   Clinical Intake:  Pre-visit preparation completed: Yes  Pain : 0-10 Pain Score: 5  Pain Type: Chronic pain, Acute pain Pain Location: Back Pain Descriptors / Indicators: Aching Pain Onset: 1 to 4 weeks ago Pain Frequency: Several days a week     BMI - recorded: 37.68 Nutritional Status: BMI > 30  Obese Nutritional Risks: None Diabetes: No  How often do you need to have someone help you when you read instructions, pamphlets, or other written materials from your doctor or pharmacy?: 1 - Never  Diabetic?NO, pre-diabetes per pt.  Interpreter Needed?: No  Information entered by :: Venia Carbon, LPN   Activities of Daily Living In your present state of health, do you have any difficulty performing the following activities: 12/25/2020  Hearing? N  Vision? N  Difficulty concentrating or making decisions? Y  Walking or climbing stairs? Y  Dressing or bathing? N  Doing errands, shopping? N  Preparing Food and eating ? Y  Using the Toilet? N  In the past six months, have you accidently leaked urine? Y  Do you have problems with loss of bowel control? N  Managing your Medications? Y  Managing your Finances? Y  Housekeeping or managing your Housekeeping? Y  Some recent data might be hidden    Patient Care Team: Heather Roberts, NP as PCP - General (Nurse Practitioner) Laqueta Linden, MD (Inactive) as PCP - Cardiology (Cardiology) West Bali, MD (Inactive) (Gastroenterology)  Indicate any recent Medical Services you may have received from other than [facility] providers in the past year (date may be approximate).     Assessment:   This is a routine wellness examination for Kayden.  Hearing/Vision screen Hearing Screening - Comments:: No hearing issues.  Vision Screening - Comments:: Glasses. [facility] in [place]. Due for exam.   Dietary issues and exercise activities discussed: Current Exercise Habits: Home exercise routine, Type of exercise:  walking, Time (Minutes): 20, Frequency (Times/Week): 5, Weekly Exercise (Minutes/Week): 100, Intensity: Mild, Exercise limited by: neurologic condition(s);cardiac condition(s);orthopedic condition(s)   Goals Addressed             This Visit's Progress    DIET - INCREASE WATER INTAKE       Pt states she would like to eat healthier and walk more.        Depression Screen PHQ 2/9 Scores 12/25/2020 09/20/2020 05/08/2020 04/18/2020 04/10/2020 12/01/2019 08/31/2019  PHQ - 2 Score 4 0 0 1 0 1 0  PHQ- 9 Score 11 - - - - 6 0  Exception Documentation - - - - - - -    Fall Risk Fall Risk  12/25/2020 09/20/2020 05/23/2020 05/08/2020 04/18/2020  Falls in the past year? 0 1 0 0 0  Number falls in past yr: 0 0 0 0 0  Injury with Fall? 0 0 0 0 0  Risk Factor Category  - - - - -  Risk for fall due to : Impaired balance/gait;Impaired vision No Fall Risks - No Fall Risks No Fall Risks  Follow up Falls prevention discussed Falls evaluation completed - Falls evaluation completed Falls evaluation completed    FALL RISK PREVENTION PERTAINING TO THE HOME:  Any stairs in or around the home? No  If so, are there any without handrails? No  Home free of loose throw rugs in walkways, pet beds, electrical cords, etc? No  Adequate lighting in your home to reduce risk of falls? No   ASSISTIVE DEVICES UTILIZED TO PREVENT FALLS:  Life alert? No  Use of a cane, walker or w/c? Yes  Grab bars in the bathroom? Yes  Shower chair or bench in shower? No  Elevated toilet seat or a handicapped toilet? No   TIMED UP AND GO:  Was the test performed? Yes .  Length of time to ambulate 10 feet: 5 sec.   Gait slow and steady without use of assistive device  Cognitive Function:     6CIT Screen 12/25/2020  What Year? 0 points  What month? 0 points  What time?  0 points  Count back from 20 0 points  Months in reverse 2 points  Repeat phrase 2 points  Total Score 4    Immunizations Immunization History  Administered  Date(s) Administered   Influenza,inj,Quad PF,6+ Mos 04/25/2017   Moderna Sars-Covid-2 Vaccination 05/17/2020    TDAP status: Due, Education has been provided regarding the importance of this vaccine. Advised may receive this vaccine at local pharmacy or Health Dept. Aware to provide a copy of the vaccination record if obtained from local pharmacy or Health Dept. Verbalized acceptance and understanding.  Flu Vaccine status: Due, Education has been provided regarding the importance of this vaccine. Advised may receive this vaccine at local pharmacy or Health Dept. Aware to provide a copy of the vaccination record if obtained from local pharmacy or Health Dept. Verbalized acceptance and understanding.  Pneumococcal vaccine status: Due, Education has been provided regarding the importance of this vaccine. Advised may receive this vaccine at local pharmacy or Health Dept. Aware to provide a copy of the vaccination record if obtained from local pharmacy or Health Dept. Verbalized acceptance and understanding.  Covid-19 vaccine status: Information provided on how to obtain vaccines.   Qualifies for Shingles Vaccine? Yes   Zostavax completed No   Shingrix Completed?: No.    Education has been provided regarding the importance of this vaccine. Patient has been advised to call insurance company to determine out of pocket expense if they have not yet received this vaccine. Advised may also receive vaccine at local pharmacy or Health Dept. Verbalized acceptance and understanding.  Screening Tests Health Maintenance  Topic Date Due   Hepatitis C Screening  Never done   TETANUS/TDAP  Never done   Zoster Vaccines- Shingrix (1 of 2) Never done   PAP SMEAR-Modifier  09/23/2016   COVID-19 Vaccine (2 - Moderna risk series) 06/14/2020   INFLUENZA VACCINE  10/30/2020   COLONOSCOPY (Pts 45-57yrs Insurance coverage will need to be confirmed)  12/23/2021   MAMMOGRAM  01/30/2022   HIV Screening  Completed   HPV  VACCINES  Aged Out    Health Maintenance  Health Maintenance Due  Topic Date Due   Hepatitis C Screening  Never done   TETANUS/TDAP  Never done   Zoster Vaccines- Shingrix (1 of 2) Never done   PAP SMEAR-Modifier  09/23/2016   COVID-19 Vaccine (2 - Moderna risk series) 06/14/2020   INFLUENZA VACCINE  10/30/2020    Colorectal cancer screening: Type of screening: Colonoscopy. Completed 12/24/2011. Repeat every 12/23/2021 years  Mammogram status: Completed 01/31/2020. Repeat every year  Bone Density status: Ordered NOT DONE YET DUE TO AGE. Pt provided with contact info and advised to call to schedule appt.  Lung Cancer Screening: (Low Dose CT Chest recommended if Age 25-80 years, 30 pack-year currently smoking OR have quit w/in 15years.) does not qualify.    Additional Screening:  Hepatitis C Screening: does not qualifY.  Vision Screening: Recommended annual ophthalmology exams for early detection of glaucoma and other disorders of the eye. Is the patient up to date with their annual eye exam?  No  Who is the provider or what is the name of the office in which the patient attends annual eye exams? [facility].  If pt is not established with a provider, would they like to be referred to a provider to establish care? No . Referral sent to schedule appointment for patient.  Dental Screening: Recommended annual dental exams for proper oral hygiene  Community Resource Referral / Chronic Care Management:  CRR required this visit?  No   CCM required this visit?  No      Plan:     I have personally reviewed and noted the following in the patient's chart:   Medical and social history Use of alcohol, tobacco or illicit drugs  Current medications and supplements including opioid prescriptions. Patient is not currently taking opioid prescriptions. Functional ability and status Nutritional status Physical activity Advanced directives List of other physicians Hospitalizations,  surgeries, and ER visits in previous 12 months Vitals Screenings to include cognitive, depression, and falls Referrals and appointments  In addition, I have reviewed and discussed with patient certain preventive protocols, quality metrics, and best practice recommendations. A written personalized care plan for preventive services as well as general preventive health recommendations were provided to patient.     Darral Dash, LPN   [id number]/05/4915   Nurse Notes: Pt states she is doing well other than having issues with some back pain. Advised pt of need for Flu, Prevnar, Tdap and Shingles vaccines and how to obtain the. Pt also due for mammogram. Pt states she has letter to call and schedule.

## 2020-12-25 NOTE — Telephone Encounter (Signed)
If you are on parachute, you can put in the order for the shower chair with [employer], and I am supposed to be able to sign off on it. Usually there is a long series of questions, so people have to come in for a separate visit for DME (generally requires a face-to-face). If parachute will let you put it in, you can try that.  I sent in lexapro 20 mg.

## 2020-12-25 NOTE — Telephone Encounter (Signed)
Pt here for AWV. Pt with c/o increased depression since her mother passed away in 2022/12/08. Pt is currently on Lexapro and would like to know if dose could be increased. Depression screening score with me today was 11. Pt also asks for Rx for a shower chair, if possible.  Thank you!

## 2020-12-25 NOTE — Patient Instructions (Signed)
Patty [name] , Thank you for taking time to come for your Medicare Wellness Visit. I appreciate your ongoing commitment to your health goals. Please review the following plan we discussed and let me know if I can assist you in the future.   Screening recommendations/referrals: Colonoscopy: Due 12/23/2021.  Mammogram: Due 01/31/2021 Bone Density: Due at age 55 Recommended yearly ophthalmology/optometry visit for glaucoma screening and checkup Recommended yearly dental visit for hygiene and checkup  Vaccinations: Influenza vaccine: Repeat annually  Pneumococcal vaccine: Due now. Tdap vaccine: Due now. Repeat in 10 years  Shingles vaccine: Shingrix discussed. Please contact your pharmacy for coverage information.    Covid-19: Please bring copy of card.   Advanced directives: Advance directive discussed with you today. I have provided a copy for you to complete at home and have notarized. Once this is complete please bring a copy in to our office so we can scan it into your chart.   Conditions/risks identified: Aim for 30 minutes of exercise or walking each day, drink 6-8 glasses of water and eat lots of fruits and vegetables.   Next appointment: Follow up in one year for your annual wellness visit. 2023   Preventive Care 40-64 Years, Female Preventive care refers to lifestyle choices and visits with your health care provider that can promote health and wellness. What does preventive care include? A yearly physical exam. This is also called an annual well check. Dental exams once or twice a year. Routine eye exams. Ask your health care provider how often you should have your eyes checked. Personal lifestyle choices, including: Daily care of your teeth and gums. Regular physical activity. Eating a healthy diet. Avoiding tobacco and drug use. Limiting alcohol use. Practicing safe sex. Taking low-dose aspirin daily starting at age 70. Taking vitamin and mineral supplements as  recommended by your health care provider. What happens during an annual well check? The services and screenings done by your health care provider during your annual well check will depend on your age, overall health, lifestyle risk factors, and family history of disease. Counseling  Your health care provider may ask you questions about your: Alcohol use. Tobacco use. Drug use. Emotional well-being. Home and relationship well-being. Sexual activity. Eating habits. Work and work Statistician. Method of birth control. Menstrual cycle. Pregnancy history. Screening  You may have the following tests or measurements: Height, weight, and BMI. Blood pressure. Lipid and cholesterol levels. These may be checked every 5 years, or more frequently if you are over 40 years old. Skin check. Lung cancer screening. You may have this screening every year starting at age 41 if you have a 30-pack-year history of smoking and currently smoke or have quit within the past 15 years. Fecal occult blood test (FOBT) of the stool. You may have this test every year starting at age 58. Flexible sigmoidoscopy or colonoscopy. You may have a sigmoidoscopy every 5 years or a colonoscopy every 10 years starting at age 74. Hepatitis C blood test. Hepatitis B blood test. Sexually transmitted disease (STD) testing. Diabetes screening. This is done by checking your blood sugar (glucose) after you have not eaten for a while (fasting). You may have this done every 1-3 years. Mammogram. This may be done every 1-2 years. Talk to your health care provider about when you should start having regular mammograms. This may depend on whether you have a family history of breast cancer. BRCA-related cancer screening. This may be done if you have a family history of breast, ovarian,  tubal, or peritoneal cancers. Pelvic exam and Pap test. This may be done every 3 years starting at age 78. Starting at age 35, this may be done every 5 years if  you have a Pap test in combination with an HPV test. Bone density scan. This is done to screen for osteoporosis. You may have this scan if you are at high risk for osteoporosis. Discuss your test results, treatment options, and if necessary, the need for more tests with your health care provider. Vaccines  Your health care provider may recommend certain vaccines, such as: Influenza vaccine. This is recommended every year. Tetanus, diphtheria, and acellular pertussis (Tdap, Td) vaccine. You may need a Td booster every 10 years. Zoster vaccine. You may need this after age 11. Pneumococcal 13-valent conjugate (PCV13) vaccine. You may need this if you have certain conditions and were not previously vaccinated. Pneumococcal polysaccharide (PPSV23) vaccine. You may need one or two doses if you smoke cigarettes or if you have certain conditions. Talk to your health care provider about which screenings and vaccines you need and how often you need them. This information is not intended to replace advice given to you by your health care provider. Make sure you discuss any questions you have with your health care provider. Document Released: 04/14/2015 Document Revised: 12/06/2015 Document Reviewed: 01/17/2015 Elsevier Interactive Patient Education  2017 [employer] Prevention in the Home Falls can cause injuries. They can happen to people of all ages. There are many things you can do to make your home safe and to help prevent falls. What can I do on the outside of my home? Regularly fix the edges of walkways and driveways and fix any cracks. Remove anything that might make you trip as you walk through a door, such as a raised step or threshold. Trim any bushes or trees on the path to your home. Use bright outdoor lighting. Clear any walking paths of anything that might make someone trip, such as rocks or tools. Regularly check to see if handrails are loose or broken. Make sure that both  sides of any steps have handrails. Any raised decks and porches should have guardrails on the edges. Have any leaves, snow, or ice cleared regularly. Use sand or salt on walking paths during winter. Clean up any spills in your garage right away. This includes oil or grease spills. What can I do in the bathroom? Use night lights. Install grab bars by the toilet and in the tub and shower. Do not use towel bars as grab bars. Use non-skid mats or decals in the tub or shower. If you need to sit down in the shower, use a plastic, non-slip stool. Keep the floor dry. Clean up any water that spills on the floor as soon as it happens. Remove soap buildup in the tub or shower regularly. Attach bath mats securely with double-sided non-slip rug tape. Do not have throw rugs and other things on the floor that can make you trip. What can I do in the bedroom? Use night lights. Make sure that you have a light by your bed that is easy to reach. Do not use any sheets or blankets that are too big for your bed. They should not hang down onto the floor. Have a firm chair that has side arms. You can use this for support while you get dressed. Do not have throw rugs and other things on the floor that can make you trip. What can I  do in the kitchen? Clean up any spills right away. Avoid walking on wet floors. Keep items that you use a lot in easy-to-reach places. If you need to reach something above you, use a strong step stool that has a grab bar. Keep electrical cords out of the way. Do not use floor polish or wax that makes floors slippery. If you must use wax, use non-skid floor wax. Do not have throw rugs and other things on the floor that can make you trip. What can I do with my stairs? Do not leave any items on the stairs. Make sure that there are handrails on both sides of the stairs and use them. Fix handrails that are broken or loose. Make sure that handrails are as long as the stairways. Check any  carpeting to make sure that it is firmly attached to the stairs. Fix any carpet that is loose or worn. Avoid having throw rugs at the top or bottom of the stairs. If you do have throw rugs, attach them to the floor with carpet tape. Make sure that you have a light switch at the top of the stairs and the bottom of the stairs. If you do not have them, ask someone to add them for you. What else can I do to help prevent falls? Wear shoes that: Do not have high heels. Have rubber bottoms. Are comfortable and fit you well. Are closed at the toe. Do not wear sandals. If you use a stepladder: Make sure that it is fully opened. Do not climb a closed stepladder. Make sure that both sides of the stepladder are locked into place. Ask someone to hold it for you, if possible. Clearly Counce and make sure that you can see: Any grab bars or handrails. First and last steps. Where the edge of each step is. Use tools that help you move around (mobility aids) if they are needed. These include: Canes. Walkers. Scooters. Crutches. Turn on the lights when you go into a dark area. Replace any light bulbs as soon as they burn out. Set up your furniture so you have a clear path. Avoid moving your furniture around. If any of your floors are uneven, fix them. If there are any pets around you, be aware of where they are. Review your medicines with your doctor. Some medicines can make you feel dizzy. This can increase your chance of falling. Ask your doctor what other things that you can do to help prevent falls. This information is not intended to replace advice given to you by your health care provider. Make sure you discuss any questions you have with your health care provider. Document Released: 01/12/2009 Document Revised: 08/24/2015 Document Reviewed: 04/22/2014 Elsevier Interactive Patient Education  2017 [employer].

## 2020-12-26 NOTE — Telephone Encounter (Signed)
Sure. Please set her up for an appointment.

## 2021-01-02 ENCOUNTER — Other Ambulatory Visit: Payer: Self-pay

## 2021-01-02 ENCOUNTER — Ambulatory Visit (INDEPENDENT_AMBULATORY_CARE_PROVIDER_SITE_OTHER): Payer: Medicare Other | Admitting: Family Medicine

## 2021-01-02 ENCOUNTER — Ambulatory Visit: Payer: Medicare Other | Admitting: Cardiovascular Disease

## 2021-01-02 ENCOUNTER — Encounter (INDEPENDENT_AMBULATORY_CARE_PROVIDER_SITE_OTHER): Payer: Self-pay

## 2021-01-02 ENCOUNTER — Encounter: Payer: Self-pay | Admitting: Family Medicine

## 2021-01-02 VITALS — BP 124/81 | HR 80 | Resp 17 | Ht 62.0 in | Wt 205.0 lb

## 2021-01-02 DIAGNOSIS — I10 Essential (primary) hypertension: Secondary | ICD-10-CM | POA: Diagnosis not present

## 2021-01-02 DIAGNOSIS — E782 Mixed hyperlipidemia: Secondary | ICD-10-CM | POA: Diagnosis not present

## 2021-01-02 DIAGNOSIS — Z1159 Encounter for screening for other viral diseases: Secondary | ICD-10-CM | POA: Diagnosis not present

## 2021-01-02 DIAGNOSIS — B370 Candidal stomatitis: Secondary | ICD-10-CM | POA: Diagnosis not present

## 2021-01-02 DIAGNOSIS — R7302 Impaired glucose tolerance (oral): Secondary | ICD-10-CM

## 2021-01-02 DIAGNOSIS — Z23 Encounter for immunization: Secondary | ICD-10-CM | POA: Diagnosis not present

## 2021-01-02 DIAGNOSIS — Z1231 Encounter for screening mammogram for malignant neoplasm of breast: Secondary | ICD-10-CM

## 2021-01-02 MED ORDER — NYSTATIN 100000 UNIT/ML MT SUSP: 5.0000 mL | Freq: Four times a day (QID) | OROMUCOSAL | 0 refills | Status: DC

## 2021-01-02 NOTE — Patient Instructions (Addendum)
F/U in 4 months wih M. Pearline Cables, call if you need [place] sooner  Flu vaccine today  Hep C screen, chem 7 and EGFR, hBA1C  today  Nystatin is prescribed for thrush  Please schedule mammogram at  checkout, due 11/2 or after  It is important that you exercise regularly at least 30 minutes 5 times a week. If you develop chest pain, have severe difficulty breathing, or feel very tired, stop exercising immediately and seek medical attention  Think about what you will eat, plan ahead. Choose " clean, green, fresh or frozen" over canned, processed or packaged foods which are more sugary, salty and fatty. 70 to 75% of food eaten should be vegetables and fruit. Three meals at set times with snacks allowed between meals, but they must be fruit or vegetables. Aim to eat over a 12 hour period , example 7 am to 7 pm, and STOP after  your last meal of the day. Drink water,generally about 64 ounces per day, no other drink is as healthy. Fruit juice is best enjoyed in a healthy way, by EATING the fruit. Thanks for choosing [facility], we consider it a privelige to serve you.

## 2021-01-02 NOTE — Assessment & Plan Note (Signed)
Patient educated about the importance of limiting  Carbohydrate intake , the need to commit to daily physical activity for a minimum of 30 minutes , and to commit weight loss. The fact that changes in all these areas will reduce or eliminate all together the development of diabetes is stressed.   Diabetic Labs Latest Ref Rng & Units 09/21/2020 09/09/2018 09/08/2018 11/10/2017 05/23/2017  HbA1c <5.7 % of total Hgb - - 5.7(H) 5.7(H) -  Chol <200 mg/dL - 570 - 177 -  HDL > OR = 50 mg/dL - 93(J) - 51 -  Calc LDL mg/dL (calc) - 030(S) - 923(R) -  Triglycerides <150 mg/dL - 007 - 622 -  Creatinine 0.57 - 1.00 mg/dL 6.33 - 3.54 5.62 5.63   BP/Weight 01/02/2021 12/25/2020 11/20/2020 09/20/2020 07/10/2020 04/10/2020 12/01/2019  Systolic BP 124 110 134 126 120 133 124  Diastolic BP 81 77 92 82 70 79 79  Wt. (Lbs) 205 206 - 206 204.2 210 210  BMI 37.49 37.68 - 37.68 37.35 38.41 38.41   No flowsheet data found.   Updated lab needed

## 2021-01-02 NOTE — Assessment & Plan Note (Signed)
Controlled, no change in medication DASH diet and commitment to daily physical activity for a minimum of 30 minutes discussed and encouraged, as a part of hypertension management. The importance of attaining a healthy weight is also discussed.  BP/Weight 01/02/2021 12/25/2020 11/20/2020 09/20/2020 07/10/2020 04/10/2020 12/01/2019  Systolic BP 124 110 134 126 120 133 124  Diastolic BP 81 77 92 82 70 79 79  Wt. (Lbs) 205 206 - 206 204.2 210 210  BMI 37.49 37.68 - 37.68 37.35 38.41 38.41

## 2021-01-02 NOTE — Progress Notes (Signed)
Patty [name]     MRN: [id number]      DOB: 06-12-1965   HPI Patty [name] is here with a 2 week h/o oral thrush, she has had this in the past, has prediabetes and was recently on prednisone. Denies fever or chills, no c/o vaginal itch or discharge ROS Denies recent fever or chills. Denies sinus pressure, nasal congestion, ear pain or sore throat. Denies chest congestion, productive cough or wheezing. Denies chest pains, palpitations and leg swelling Denies abdominal pain, nausea, vomiting,diarrhea or constipation.   Denies dysuria, frequency, hesitancy or incontinence. Denies joint pain, swelling and limitation in mobility. Denies headaches, seizures, numbness, or tingling. Denies depression, anxiety or insomnia. Denies skin break down or rash.   PE  BP 124/81 (BP Location: Right Arm, Patient Position: Sitting)   Pulse 80   Resp 17   Ht 5\' 2"  (1.575 m)   Wt 205 lb (93 kg)   SpO2 93%   BMI 37.49 kg/m   Patient alert and oriented and in no cardiopulmonary distress.  HEENT: No facial asymmetry, EOMI,     Neck supple .thrush on tongue  Chest: Clear to auscultation bilaterally.  CVS: S1, S2 no murmurs, no S3.Regular rate.  ABD: Soft non tender.   Ext: No edema  MS: Adequate ROM spine, shoulders, hips and knees.  Skin: Intact, no ulcerations or rash noted.  Psych: Good eye contact, normal affect. Memory intact not anxious or depressed appearing.  CNS: CN 2-12 intact, power,  normal throughout.no focal deficits noted.   Assessment & Plan  Oral thrush 2 week history, symptomatic, nystatin swish and swallow x 10 days  Morbid obesity due to excess calories [facility])  Patient re-educated about  the importance of commitment to a  minimum of 150 minutes of exercise per week as able.  The importance of healthy food choices with portion control discussed, as well as eating regularly and within a 12 hour window most days. The need to choose "clean , green" food 50 to 75% of the  time is discussed, as well as to make water the primary drink and set a goal of 64 ounces water daily.    Weight /BMI 01/02/2021 12/25/2020 09/20/2020  WEIGHT 205 lb 206 lb 206 lb  HEIGHT 5\' 2"  5\' 2"  5\' 2"   BMI 37.49 kg/m2 37.68 kg/m2 37.68 kg/m2      Essential hypertension Controlled, no change in medication DASH diet and commitment to daily physical activity for a minimum of 30 minutes discussed and encouraged, as a part of hypertension management. The importance of attaining a healthy weight is also discussed.  BP/Weight 01/02/2021 12/25/2020 11/20/2020 09/20/2020 07/10/2020 04/10/2020 12/01/2019  Systolic BP 124 110 134 126 120 133 124  Diastolic BP 81 77 92 82 70 79 79  Wt. (Lbs) 205 206 - 206 204.2 210 210  BMI 37.49 37.68 - 37.68 37.35 38.41 38.41       IGT (impaired glucose tolerance) Patient educated about the importance of limiting  Carbohydrate intake , the need to commit to daily physical activity for a minimum of 30 minutes , and to commit weight loss. The fact that changes in all these areas will reduce or eliminate all together the development of diabetes is stressed.   Diabetic Labs Latest Ref Rng & Units 09/21/2020 09/09/2018 09/08/2018 11/10/2017 05/23/2017  HbA1c <5.7 % of total Hgb - - 5.7(H) 5.7(H) -  Chol <200 mg/dL - 11/09/2018 - 11/08/2018 -  HDL > OR = 50 mg/dL -  47(L) - 51 -  Calc LDL mg/dL (calc) - 503(U) - 882(C) -  Triglycerides <150 mg/dL - 003 - 491 -  Creatinine 0.57 - 1.00 mg/dL 7.91 - 5.05 6.97 9.48   BP/Weight 01/02/2021 12/25/2020 11/20/2020 09/20/2020 07/10/2020 04/10/2020 12/01/2019  Systolic BP 124 110 134 126 120 133 124  Diastolic BP 81 77 92 82 70 79 79  Wt. (Lbs) 205 206 - 206 204.2 210 210  BMI 37.49 37.68 - 37.68 37.35 38.41 38.41   No flowsheet data found.   Updated lab needed

## 2021-01-02 NOTE — Assessment & Plan Note (Signed)
  Patient re-educated about  the importance of commitment to a  minimum of 150 minutes of exercise per week as able.  The importance of healthy food choices with portion control discussed, as well as eating regularly and within a 12 hour window most days. The need to choose "clean , green" food 50 to 75% of the time is discussed, as well as to make water the primary drink and set a goal of 64 ounces water daily.    Weight /BMI 01/02/2021 12/25/2020 09/20/2020  WEIGHT 205 lb 206 lb 206 lb  HEIGHT 5\' 2"  5\' 2"  5\' 2"   BMI 37.49 kg/m2 37.68 kg/m2 37.68 kg/m2

## 2021-01-02 NOTE — Assessment & Plan Note (Signed)
2 week history, symptomatic, nystatin swish and swallow x 10 days

## 2021-01-03 ENCOUNTER — Telehealth: Payer: Self-pay

## 2021-01-03 LAB — HEPATITIS C ANTIBODY: Hep C Virus Ab: <0.1 s/co ratio (ref 0.0–0.9)

## 2021-01-03 LAB — BMP8+EGFR
BUN/Creatinine Ratio: 18 (ref 9–23)
BUN: 15 mg/dL (ref 6–24)
CO2: 22 mmol/L (ref 20–29)
Calcium: 9.6 mg/dL (ref 8.7–10.2)
Chloride: 105 mmol/L (ref 96–106)
Creatinine, Ser: 0.84 mg/dL (ref 0.57–1.00)
Glucose: 94 mg/dL (ref 70–99)
Potassium: 3.9 mmol/L (ref 3.5–5.2)
Sodium: 142 mmol/L (ref 134–144)
eGFR: 82 mL/min/{1.73_m2} (ref >59)

## 2021-01-03 LAB — HEMOGLOBIN A1C
Est. average glucose Bld gHb Est-mCnc: 123 mg/dL
Hgb A1c MFr Bld: 5.9 % — ABNORMAL HIGH (ref 4.8–5.6)

## 2021-01-03 NOTE — Telephone Encounter (Signed)
Referral sent 

## 2021-01-03 NOTE — Telephone Encounter (Signed)
Patient would like a referral sent to [facility] in [place], needs PAP.

## 2021-01-21 IMAGING — DX RIGHT KNEE - COMPLETE 4+ VIEW
4 series · 4 of 4 positions shown · non-contrast
Comparison: None.

CLINICAL DATA: Chronic right knee pain

EXAM:
RIGHT KNEE - COMPLETE 4+ VIEW

[knee ap]
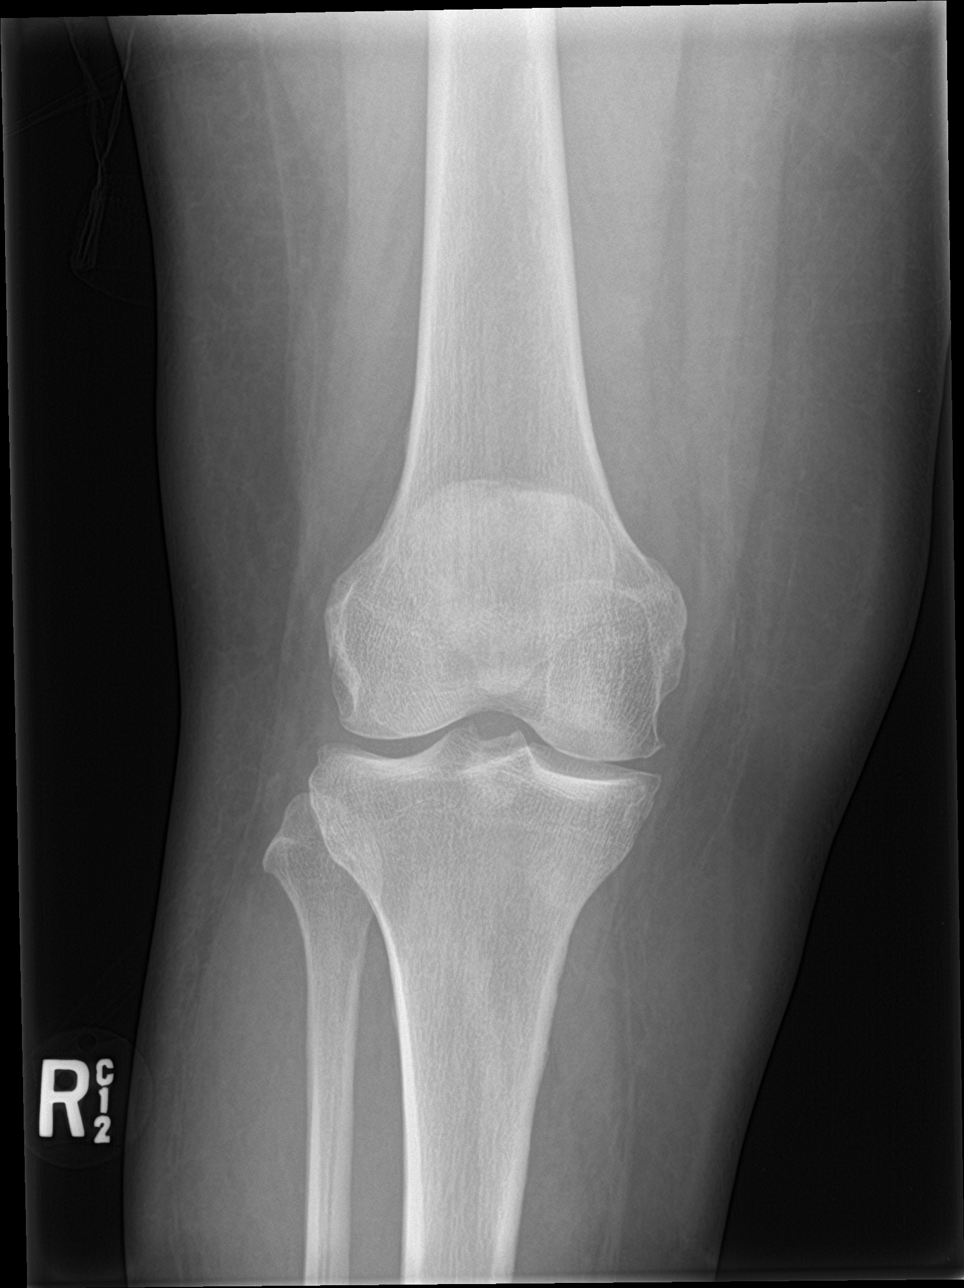

[tunnel]
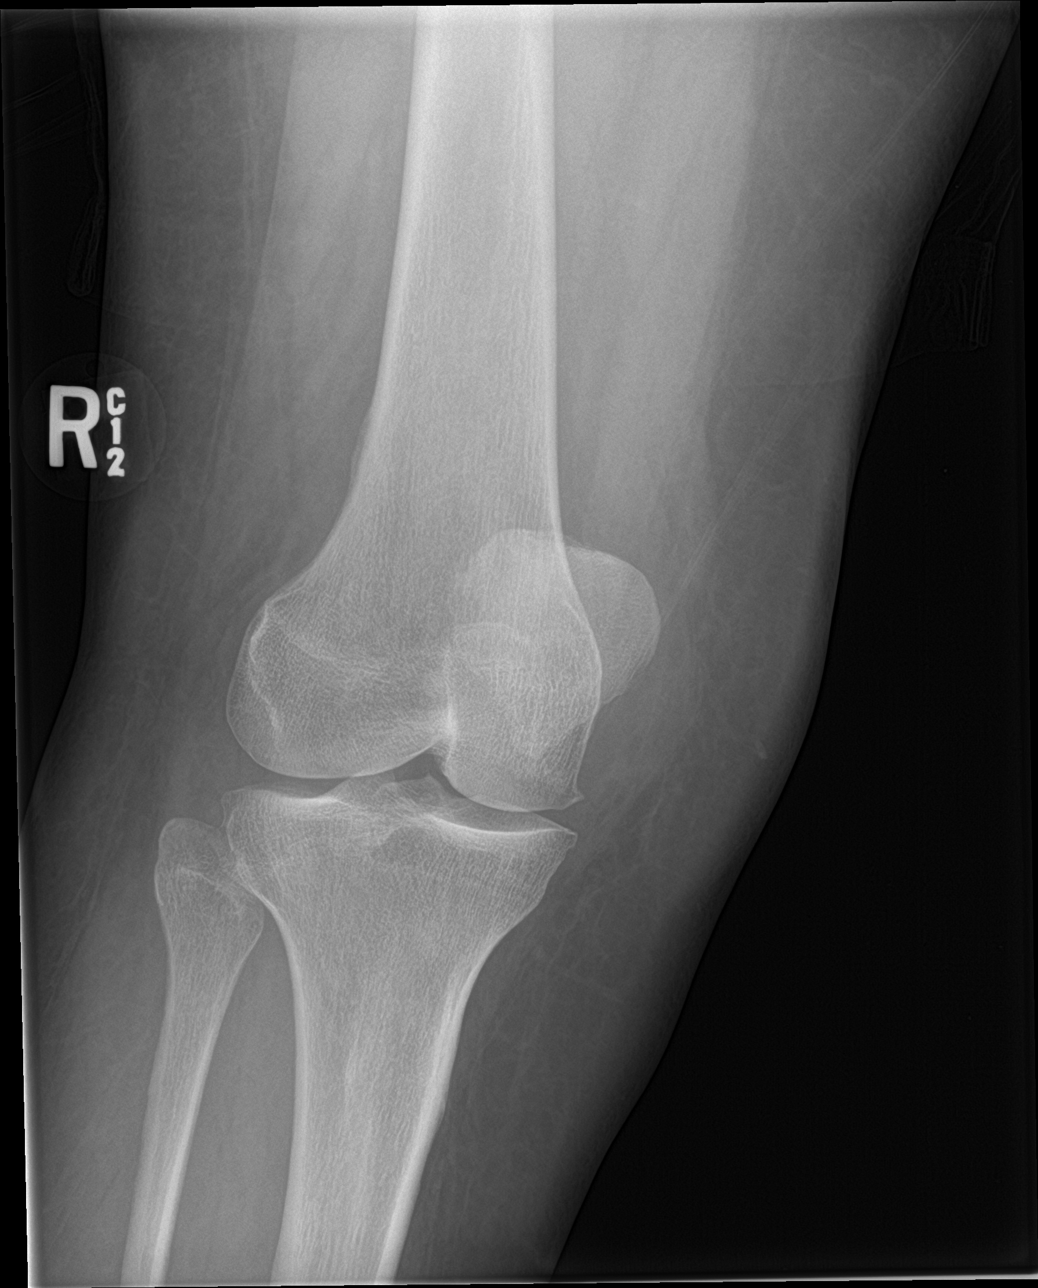

[knee lat]
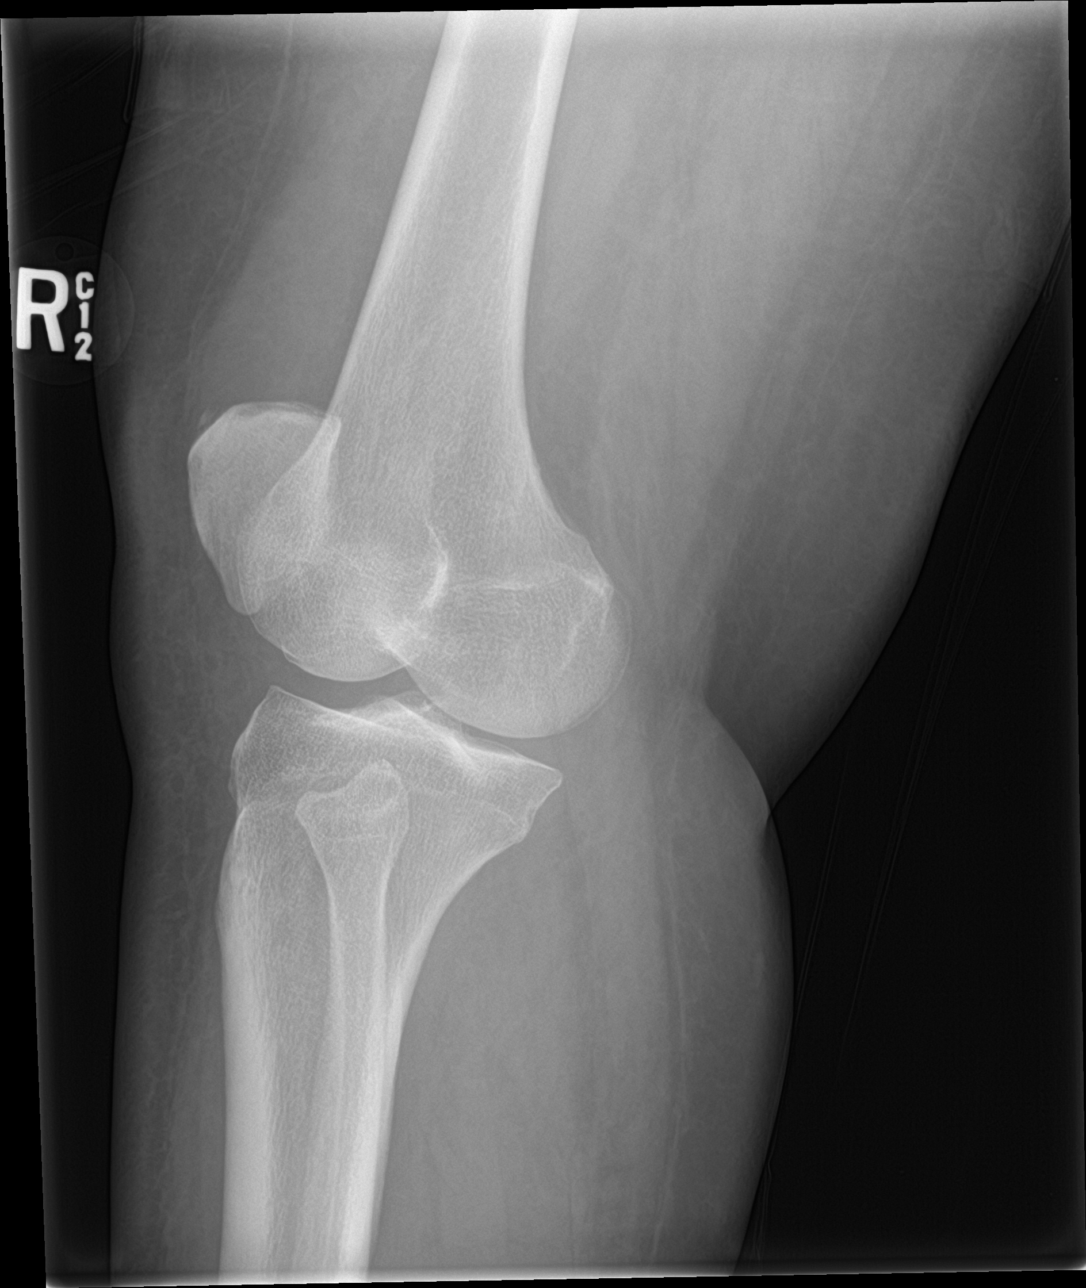

[knee sunrise]
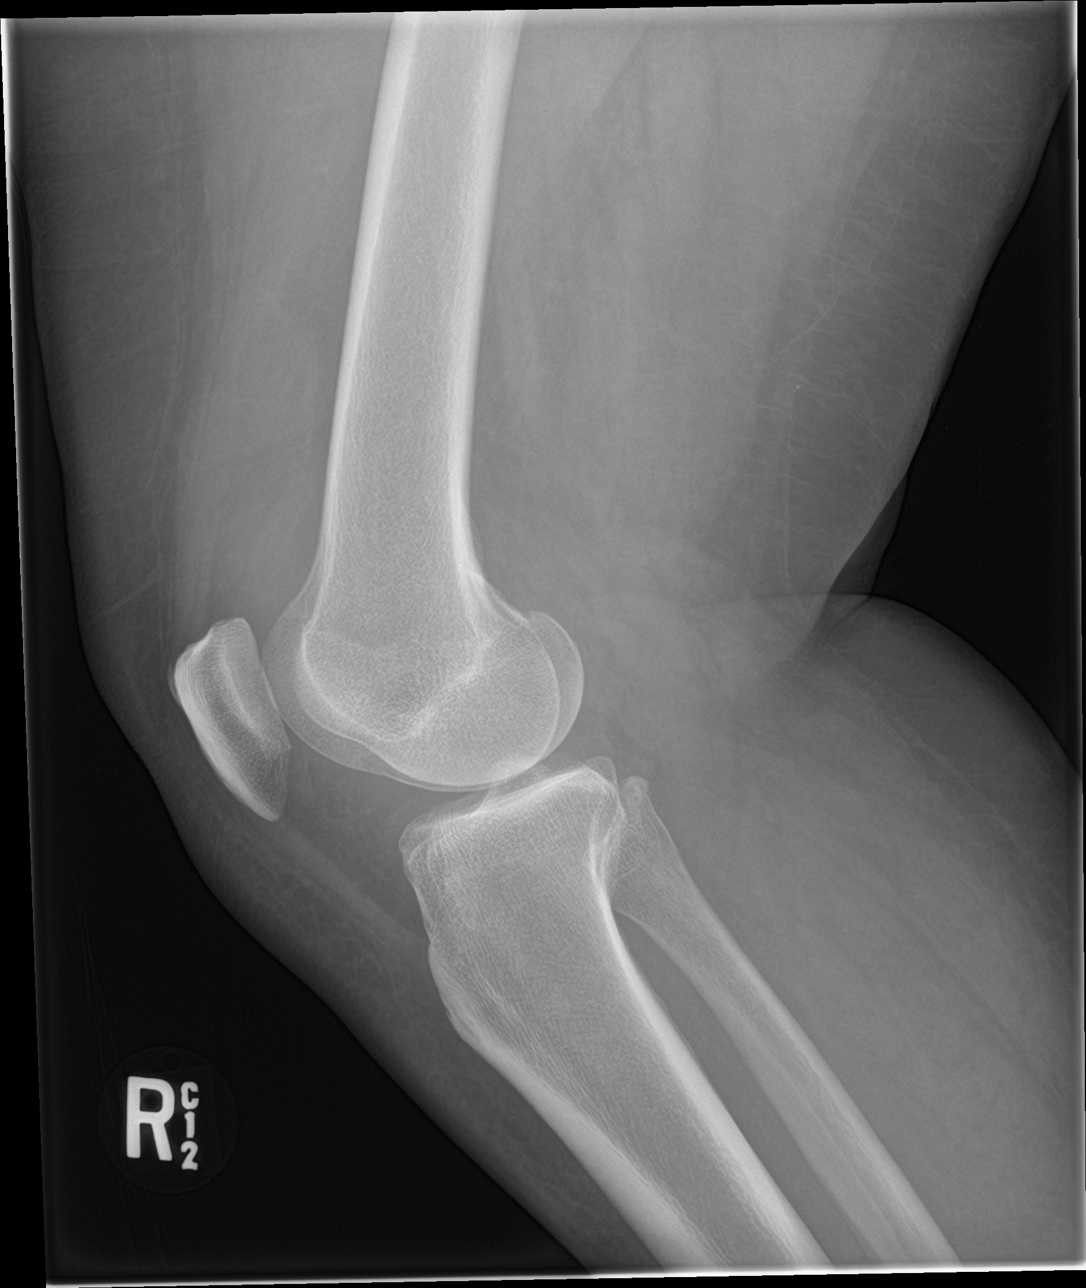

[4 of 4 positions shown; findings below may reference images not displayed]

FINDINGS: No fracture or dislocation of the right knee. There is mild medial
compartment joint space loss and osteophytosis. Seven face are
otherwise well preserved. There is a large, nonspecific right knee
joint effusion.
IMPRESSION: No fracture or dislocation of the right knee. There is mild medial
compartment joint space loss and osteophytosis. Seven face are
otherwise well preserved. There is a large, nonspecific right knee
joint effusion.

## 2021-01-30 NOTE — Progress Notes (Signed)
Cardiology Office Note:   Date:  01/31/2021  NAME:  Patty [name]    MRN: [id number] DOB:  27-Apr-1965   PCP:  Heather Roberts, NP  Cardiologist:  Prentice Docker, MD (Inactive)  Electrophysiologist:  None   Referring MD: Heather Roberts, NP   Chief Complaint  Patient presents with   Chest Pain   History of Present Illness:   Patty [name] is a 55 y.o. female with a hx of HTN, CVA who is being seen today for the evaluation of chest pain at the request of Heather Roberts, NP.  She reports for the last 3 to 4 months she has had daily episodes of chest pain.  She describes sharp pain in the center of her chest.  She reports it occurs randomly and is not associated with exertion or alleviated by rest.  She reports stress can trigger the pain.  No association with food.  She reports she has several grandchildren she takes care of and this can increase her symptoms.  She is exquisitely tender to palpation in the left chest.  She also reports exertional shortness of breath but is quite an active.  Her blood pressure is well controlled today in office.  Most recent labs show total cholesterol 170, HDL 47, LDL 101, triglycerides 120.  She is not diabetic.  She did have a stroke in the 1990s.  No further recurrence.  Her blood pressure is well controlled.  She is a never smoker.  She does not drink alcohol in excess.  No drug use is reported.  She is disabled.  She reports no change in medications.  Denies any significant symptoms in office.  Her EKG today demonstrates normal sinus rhythm with no acute ischemic changes or evidence of infarction  Problem List HTN CVA  Past Medical History: Past Medical History:  Diagnosis Date   Acute respiratory failure with hypoxia (HCC) 10/18/2014   Asthma    Cholecystitis with cholelithiasis 07/12/2014   Cholelithiasis with acute cholecystitis 07/13/2014   DDD (degenerative disc disease), lumbar    Depression    HTN (hypertension)    Hyperlipidemia    Rectal bleed  11/13/2011   Seizures (HCC)    remote   Stroke [facility]) 1990   chronic right hemiparesis    Past Surgical History: Past Surgical History:  Procedure Laterality Date   CESAREAN SECTION     CHOLECYSTECTOMY N/A 07/13/2014   Procedure: LAPAROSCOPIC CONVERTED TO OPEN  CHOLECYSTECTOMY ;  Surgeon: Darnell Level, MD;  Location: [facility];  Service: General;  Laterality: N/A;   COLONOSCOPY  12/24/2011   Procedure: COLONOSCOPY;  Surgeon: West Bali, MD;  Location[facility];  Service: Endoscopy;  Laterality: N/A;  2:00   GALLBLADDER SURGERY     [name] /[place]   PARTIAL HYSTERECTOMY     heavy bleeding    Current Medications: Current Meds  Medication Sig   albuterol (ACCUNEB) 0.63 MG/3ML nebulizer solution Take 3 mLs (0.63 mg total) by nebulization every 6 (six) hours as needed for wheezing or shortness of breath.   aspirin EC 81 MG tablet Take 1 tablet (81 mg total) by mouth daily.   budesonide-formoterol (SYMBICORT) 80-4.5 MCG/ACT inhaler INHALE 2 PUFFS BY MOUTH EVERY 12 HOURS   divalproex (DEPAKOTE) 250 MG DR tablet TAKE 1 TABLET BY MOUTH THREE TIMES DAILY   escitalopram (LEXAPRO) 20 MG tablet Take 1 tablet (20 mg total) by mouth daily.   ibuprofen (ADVIL) 800 MG tablet Take 1 tablet (  800 mg total) by mouth 3 (three) times daily.   levalbuterol (XOPENEX HFA) 45 MCG/ACT inhaler INHALE 1 TO 2 PUFFS BY MOUTH EVERY 4 TO 6 HOURS AS NEEDED FOR WHEEZING   Nebulizers (MY MDI PORTABLE NEBULISER) MISC 1 application by Does not apply route as needed.   omeprazole (PRILOSEC) 20 MG capsule Take 1 capsule (20 mg total) by mouth daily.   oxybutynin (DITROPAN-XL) 5 MG 24 hr tablet Take 5 mg by mouth daily.   pravastatin (PRAVACHOL) 20 MG tablet TAKE 1 TABLET(20 MG) BY MOUTH DAILY   triamcinolone (KENALOG) 0.025 % ointment Apply 1 application topically 3 (three) times daily.   UNABLE TO FIND 1 application by Other route daily. Wrist splint for carpal tunnel syndrome: Apply it daily.   verapamil  (CALAN) 120 MG tablet Take 1 tablet (120 mg total) by mouth daily.     Allergies:    Bee venom   Social History: Social History   Socioeconomic History   Marital status: Divorced    Spouse name: Not on file   Number of children: 2   Years of education: Not on file   Highest education level: Not on file  Occupational History   Occupation: disabled    Employer: UNEMPLOYED  Tobacco Use   Smoking status: Never   Smokeless tobacco: Never  Vaping Use   Vaping Use: Never used  Substance and Sexual Activity   Alcohol use: Yes    Alcohol/week: 2.0 standard drinks    Types: 2 Standard drinks or equivalent per week    Comment: couple of drinks on weekends   Drug use: No   Sexual activity: Not Currently  Other Topics Concern   Not on file  Social History Narrative   On disability. Used to work at [employer]/Worked at [employer] Retired now   [employer].    Two children.    Daughter: 4    Son: 75   Has seven grand children.    Enjoys reading      Diet: eats lots of fruits and veggies, occasional meat: chicken mostly, likes pasta    Caffeine: Tea-green lipton   Water: about 5 cups daily      Wears seatbelt   Smoke detectors      Social Determinants of Health   Financial Resource Strain: Not on file  Food Insecurity: No Food Insecurity   Worried About Programme researcher, broadcasting/film/video in the Last Year: Never true   Barista in the Last Year: Never true  Transportation Needs: No Transportation Needs   Lack of Transportation (Medical): No   Lack of Transportation (Non-Medical): No  Physical Activity: Sufficiently Active   Days of Exercise per Week: 5 days   Minutes of Exercise per Session: 30 min  Stress: No Stress Concern Present   Feeling of Stress : Only a little  Social Connections: Moderately Integrated   Frequency of Communication with Friends and Family: More than three times a week   Frequency of Social Gatherings with Friends and Family: More than three times a  week   Attends Religious Services: 1 to 4 times per year   Active Member of [employer] or Organizations: No   Attends Engineer, structural: 1 to 4 times per year   Marital Status: Divorced     Family History: The patient's family history includes Diabetes in an other family member; Stroke in her father. There is no history of Colon cancer or Liver disease.  ROS:  All other ROS reviewed and negative. Pertinent positives noted in the HPI.     EKGs/Labs/Other Studies Reviewed:   The following studies were personally reviewed by me today:  EKG:  EKG is ordered today.  The ekg ordered today demonstrates normal sinus rhythm heart rate 77, no acute ischemic changes or evidence of infarction, and was personally reviewed by me.   Stress test 05/2017   Probable normal perfusion and minimal soft tissue attenuation (Breast) No ischemia or scar. Nuclear stress EF: 53%. This is a low risk study. There was no ST segment deviation noted during stress.   Echo 05/2017 - Left ventricle: The cavity size was normal. Systolic function was    normal. The estimated ejection fraction was in the range of 60%    to 65%. Wall motion was normal; there were no regional wall    motion abnormalities. The study is not technically sufficient to    allow evaluation of LV diastolic function as the patient was    tachycardic resulting in E/a fusion. Mild concentric and moderate    focal basal septal hypertrophy.    Recent Labs: 09/21/2020: ALT 18; Hemoglobin 13.4; Platelets 237; TSH 1.840 01/02/2021: BUN 15; Creatinine, Ser 0.84; Potassium 3.9; Sodium 142   Recent Lipid Panel    Component Value Date/Time   CHOL 170 09/09/2018 0750   TRIG 120 09/09/2018 0750   HDL 47 (L) 09/09/2018 0750   CHOLHDL 3.6 09/09/2018 0750   VLDL 15 02/29/2016 0757   LDLCALC 101 (H) 09/09/2018 0750    Physical Exam:   VS:  BP 116/64   Pulse 78   Ht 5\' 2"  (1.575 m)   Wt 208 lb 6.4 oz (94.5 kg)   SpO2 99%   BMI 38.12 kg/m     Wt Readings from Last 3 Encounters:  01/31/21 208 lb 6.4 oz (94.5 kg)  01/02/21 205 lb (93 kg)  12/25/20 206 lb (93.4 kg)    General: Well nourished, well developed, in no acute distress Head: Atraumatic, normal size  Eyes: PEERLA, EOMI  Neck: Supple, no JVD Endocrine: No thryomegaly Cardiac: Normal S1, S2; RRR; no murmurs, rubs, or gallops Lungs: Clear to auscultation bilaterally, no wheezing, rhonchi or rales  Abd: Soft, nontender, no hepatomegaly  Ext: No edema, pulses 2+ Musculoskeletal: No deformities, BUE and BLE strength normal and equal, tenderness to palpation over the sternum and left chest Skin: Warm and dry, no rashes   Neuro: Alert and oriented to person, place, time, and situation, CNII-XII grossly intact, no focal deficits  Psych: Normal mood and affect   ASSESSMENT:   Patty [name] is a 55 y.o. female who presents for the following: 1. Precordial pain   2. Costochondritis     PLAN:   1. Precordial pain 2. Costochondritis -She presents with sharp chest pain that occurs randomly.  She is exquisitely tender over the left chest.  Her EKG is normal in office.  Her symptoms are not worsened by exertion or alleviated by rest.  They are associated with stress.  To me this is noncardiac chest pain.  She had a stress test that was normal 3 years ago.  Also had an echocardiogram that was normal.  Her cardiovascular exam is normal today.  To me this represents costochondritis.  We will treat this with a short course, 7 days of ibuprofen 800 mg 3 times daily.  I would like for her to take Prilosec 20 mg daily while on ibuprofen.  She was encouraged to  drink plenty of water and eat food with this.  I informed her that if her symptoms did not improve she should call us back and we can reevaluate.  She does have a normal EKG and a exercise treadmill stress test could be considered.  To me this is very inconsistent with cardiac chest pain.  Disposition: Return if symptoms worsen or  fail to improve.  Medication Adjustments/Labs and Tests Ordered: Current medicines are reviewed at length with the patient today.  Concerns regarding medicines are outlined above.  Orders Placed This Encounter  Procedures   EKG 12-Lead   Meds ordered this encounter  Medications   ibuprofen (ADVIL) 800 MG tablet    Sig: Take 1 tablet (800 mg total) by mouth 3 (three) times daily.    Dispense:  21 tablet    Refill:  0   omeprazole (PRILOSEC) 20 MG capsule    Sig: Take 1 capsule (20 mg total) by mouth daily.    Dispense:  7 capsule    Refill:  0     Patient Instructions  Medication Instructions:  Your physician has recommended you make the following change in your medication:  START Ibuprofen 800 mg tablets three times daily for 7 days Prilosec 20 mg tablets daily for 7 days *If you need a refill on your cardiac medications before your next appointment, please call your pharmacy*   Lab Work: None If you have labs (blood work) drawn today and your tests are completely normal, you will receive your results only by: MyChart Message (if you have MyChart) OR A paper copy in the mail If you have any lab test that is abnormal or we need to change your treatment, we will call you to review the results.   Testing/Procedures: None   Follow-Up: At [facility], you and your health needs are our priority.  As part of our continuing mission to provide you with exceptional heart care, we have created designated Provider Care Teams.  These Care Teams include your primary Cardiologist (physician) and Advanced Practice Providers (APPs -  Physician Assistants and Nurse Practitioners) who all work together to provide you with the care you need, when you need it.  We recommend signing up for the patient portal called "MyChart".  Sign up information is provided on this After Visit Summary.  MyChart is used to connect with patients for Virtual Visits (Telemedicine).  Patients are able to view  lab/test results, encounter notes, upcoming appointments, etc.  Non-urgent messages can be sent to your provider as well.   To learn more about what you can do with MyChart, go to [email or web address].    Your next appointment:   Follow Up- As Needed   Other Instructions      Signed, Lenna Gilford. Flora Lipps, MD, [facility]  [facility]  [address] [place], [place] [address] [phone]  01/31/2021 2:15 PM

## 2021-01-31 ENCOUNTER — Encounter: Payer: Self-pay | Admitting: Cardiovascular Disease

## 2021-01-31 ENCOUNTER — Ambulatory Visit (INDEPENDENT_AMBULATORY_CARE_PROVIDER_SITE_OTHER): Payer: Medicare Other | Admitting: Cardiovascular Disease

## 2021-01-31 ENCOUNTER — Other Ambulatory Visit: Payer: Self-pay

## 2021-01-31 VITALS — BP 116/64 | HR 78 | Ht 62.0 in | Wt 208.4 lb

## 2021-01-31 DIAGNOSIS — R072 Precordial pain: Secondary | ICD-10-CM | POA: Diagnosis not present

## 2021-01-31 DIAGNOSIS — M940 Chondrocostal junction syndrome [Tietze]: Secondary | ICD-10-CM

## 2021-01-31 MED ORDER — OMEPRAZOLE 20 MG PO CPDR: 20.0000 mg | DELAYED_RELEASE_CAPSULE | Freq: Every day | ORAL | 0 refills | Status: DC

## 2021-01-31 MED ORDER — IBUPROFEN 800 MG PO TABS: 800.0000 mg | ORAL_TABLET | Freq: Three times a day (TID) | ORAL | 0 refills | Status: DC

## 2021-01-31 NOTE — Patient Instructions (Signed)
Medication Instructions:  Your physician has recommended you make the following change in your medication:  START Ibuprofen 800 mg tablets three times daily for 7 days Prilosec 20 mg tablets daily for 7 days *If you need a refill on your cardiac medications before your next appointment, please call your pharmacy*   Lab Work: None If you have labs (blood work) drawn today and your tests are completely normal, you will receive your results only by: MyChart Message (if you have MyChart) OR A paper copy in the mail If you have any lab test that is abnormal or we need to change your treatment, we will call you to review the results.   Testing/Procedures: None   Follow-Up: At [facility], you and your health needs are our priority.  As part of our continuing mission to provide you with exceptional heart care, we have created designated Provider Care Teams.  These Care Teams include your primary Cardiologist (physician) and Advanced Practice Providers (APPs -  Physician Assistants and Nurse Practitioners) who all work together to provide you with the care you need, when you need it.  We recommend signing up for the patient portal called "MyChart".  Sign up information is provided on this After Visit Summary.  MyChart is used to connect with patients for Virtual Visits (Telemedicine).  Patients are able to view lab/test results, encounter notes, upcoming appointments, etc.  Non-urgent messages can be sent to your provider as well.   To learn more about what you can do with MyChart, go to [email or web address].    Your next appointment:   Follow Up- As Needed   Other Instructions

## 2021-02-01 ENCOUNTER — Ambulatory Visit (HOSPITAL_COMMUNITY): Payer: Medicare Other

## 2021-02-06 ENCOUNTER — Other Ambulatory Visit: Payer: Self-pay | Admitting: Cardiovascular Disease

## 2021-02-07 ENCOUNTER — Ambulatory Visit (HOSPITAL_COMMUNITY)
Admission: RE | Admit: 2021-02-07 | Discharge: 2021-02-07 | Disposition: A | Discharge status: Home / Self Care | Payer: Medicare Other | Source: Ambulatory Visit | Attending: Family Medicine | Admitting: Family Medicine

## 2021-02-07 ENCOUNTER — Other Ambulatory Visit: Payer: Self-pay

## 2021-02-07 DIAGNOSIS — Z1231 Encounter for screening mammogram for malignant neoplasm of breast: Secondary | ICD-10-CM | POA: Diagnosis not present

## 2021-02-11 ENCOUNTER — Other Ambulatory Visit: Payer: Self-pay | Admitting: Family Medicine

## 2021-02-12 ENCOUNTER — Other Ambulatory Visit: Payer: Self-pay | Admitting: Internal Medicine

## 2021-02-12 DIAGNOSIS — J4540 Moderate persistent asthma, uncomplicated: Secondary | ICD-10-CM

## 2021-03-01 ENCOUNTER — Other Ambulatory Visit: Payer: Self-pay | Admitting: Family Medicine

## 2021-03-02 ENCOUNTER — Other Ambulatory Visit: Payer: Self-pay | Admitting: *Deleted

## 2021-03-02 DIAGNOSIS — J4540 Moderate persistent asthma, uncomplicated: Secondary | ICD-10-CM

## 2021-03-02 MED ORDER — LEVALBUTEROL TARTRATE 45 MCG/ACT IN AERO
INHALATION_SPRAY | RESPIRATORY_TRACT | 0 refills | Status: DC
Start: 2021-03-02 — End: 2021-03-19

## 2021-03-03 ENCOUNTER — Other Ambulatory Visit: Payer: Self-pay | Admitting: Neurology

## 2021-03-05 ENCOUNTER — Ambulatory Visit: Payer: Medicare Other | Admitting: Nurse Practitioner

## 2021-03-06 ENCOUNTER — Other Ambulatory Visit: Payer: Self-pay | Admitting: Neurology

## 2021-03-19 ENCOUNTER — Other Ambulatory Visit: Payer: Self-pay

## 2021-03-19 DIAGNOSIS — J4540 Moderate persistent asthma, uncomplicated: Secondary | ICD-10-CM

## 2021-03-19 MED ORDER — LEVALBUTEROL TARTRATE 45 MCG/ACT IN AERO
INHALATION_SPRAY | RESPIRATORY_TRACT | 0 refills | Status: DC
Start: 2021-03-19 — End: 2021-04-16

## 2021-04-12 ENCOUNTER — Ambulatory Visit: Payer: Medicare Other | Admitting: Nurse Practitioner

## 2021-04-12 ENCOUNTER — Encounter: Payer: Self-pay | Admitting: Internal Medicine

## 2021-04-12 ENCOUNTER — Other Ambulatory Visit: Payer: Self-pay

## 2021-04-12 ENCOUNTER — Ambulatory Visit (INDEPENDENT_AMBULATORY_CARE_PROVIDER_SITE_OTHER): Payer: Medicare Other | Admitting: Internal Medicine

## 2021-04-12 VITALS — BP 112/72 | HR 52 | Resp 18 | Ht 62.0 in | Wt 205.0 lb

## 2021-04-12 DIAGNOSIS — I69359 Hemiplegia and hemiparesis following cerebral infarction affecting unspecified side: Secondary | ICD-10-CM

## 2021-04-12 DIAGNOSIS — G4733 Obstructive sleep apnea (adult) (pediatric): Secondary | ICD-10-CM | POA: Insufficient documentation

## 2021-04-12 DIAGNOSIS — F320 Major depressive disorder, single episode, mild: Secondary | ICD-10-CM | POA: Insufficient documentation

## 2021-04-12 DIAGNOSIS — R5382 Chronic fatigue, unspecified: Secondary | ICD-10-CM

## 2021-04-12 DIAGNOSIS — F339 Major depressive disorder, recurrent, unspecified: Secondary | ICD-10-CM | POA: Insufficient documentation

## 2021-04-12 DIAGNOSIS — I10 Essential (primary) hypertension: Secondary | ICD-10-CM | POA: Diagnosis not present

## 2021-04-12 DIAGNOSIS — F321 Major depressive disorder, single episode, moderate: Secondary | ICD-10-CM | POA: Insufficient documentation

## 2021-04-12 DIAGNOSIS — R7303 Prediabetes: Secondary | ICD-10-CM | POA: Diagnosis not present

## 2021-04-12 DIAGNOSIS — G40909 Epilepsy, unspecified, not intractable, without status epilepticus: Secondary | ICD-10-CM | POA: Diagnosis not present

## 2021-04-12 NOTE — Progress Notes (Signed)
Established Patient Office Visit  Subjective:  Patient ID: Patty [name], female    DOB: 06-03-1965  Age: 56 y.o. MRN: [id number]  CC:  Chief Complaint  Patient presents with   Follow-up    Follow up     HPI Patty [name] is a 56 y.o. female who presents for f/u of her chronic medical conditions.  HTN: BP is well-controlled. Takes medications regularly. Patient denies headache, dizziness, chest pain, dyspnea or palpitations.  She has history of CVA and seizure disorder, followed by neurology.  She takes aspirin and statin for history of CVA.  She takes Depakote for seizures.  Denies any recent seizure-like activity.  She complains of chronic fatigue and daytime somnolence.  She also reports snoring at nighttime.  She has not had any sleep study yet.  She takes Lexapro for h/o depression.  She denies any anhedonia, SI or HI.   Past Surgical History:  Procedure Laterality Date   CESAREAN SECTION     CHOLECYSTECTOMY N/A 07/13/2014   Procedure: LAPAROSCOPIC CONVERTED TO OPEN  CHOLECYSTECTOMY ;  Surgeon: Armandina Gemma, MD;  Location: [facility];  Service: General;  Laterality: N/A;   COLONOSCOPY  12/24/2011   Procedure: COLONOSCOPY;  Surgeon: Danie Binder, MD;  Location[facility];  Service: Endoscopy;  Laterality: N/A;  2:00   GALLBLADDER SURGERY     [name] /[place]   PARTIAL HYSTERECTOMY     heavy bleeding    Family History  Problem Relation Age of Onset   Stroke Father    Diabetes Other    Colon cancer Neg Hx    Liver disease Neg Hx     Social History   Socioeconomic History   Marital status: Divorced    Spouse name: Not on file   Number of children: 2   Years of education: Not on file   Highest education level: Not on file  Occupational History   Occupation: disabled    Employer: UNEMPLOYED  Tobacco Use   Smoking status: Never   Smokeless tobacco: Never  Vaping Use   Vaping Use: Never used  Substance and Sexual Activity   Alcohol use: Yes     Alcohol/week: 2.0 standard drinks    Types: 2 Standard drinks or equivalent per week    Comment: couple of drinks on weekends   Drug use: No   Sexual activity: Not Currently  Other Topics Concern   Not on file  Social History Narrative   On disability. Used to work at [employer]/Worked at [employer] Retired now   [employer]    Two children.    Daughter: 70    Son: 34   Has seven grand children.    Enjoys reading      Diet: eats lots of fruits and veggies, occasional meat: chicken mostly, likes pasta    Caffeine: Tea-green lipton   Water: about 5 cups daily      Wears seatbelt   Smoke detectors      Social Determinants of Health   Financial Resource Strain: Not on file  Food Insecurity: No Food Insecurity   Worried About Charity fundraiser in the Last Year: Never true   Arboriculturist in the Last Year: Never true  Transportation Needs: No Transportation Needs   Lack of Transportation (Medical): No   Lack of Transportation (Non-Medical): No  Physical Activity: Sufficiently Active   Days of Exercise per Week: 5 days   Minutes of Exercise per Session:  30 min  Stress: No Stress Concern Present   Feeling of Stress : Only a little  Social Connections: Moderately Integrated   Frequency of Communication with Friends and Family: More than three times a week   Frequency of Social Gatherings with Friends and Family: More than three times a week   Attends Religious Services: 1 to 4 times per year   Active Member of [employer] or Organizations: No   Attends Music therapist: 1 to 4 times per year   Marital Status: Divorced  Human resources officer Violence: Not At Risk   Fear of Current or Ex-Partner: No   Emotionally Abused: No   Physically Abused: No   Sexually Abused: No    Outpatient Medications Prior to Visit  Medication Sig Dispense Refill   albuterol (ACCUNEB) 0.63 MG/3ML nebulizer solution Take 3 mLs (0.63 mg total) by nebulization every 6 (six) hours as needed  for wheezing or shortness of breath. 3 mL 3   aspirin EC 81 MG tablet Take 1 tablet (81 mg total) by mouth daily.     budesonide-formoterol (SYMBICORT) 80-4.5 MCG/ACT inhaler INHALE 2 PUFFS BY MOUTH EVERY 12 HOURS 10.2 g 2   divalproex (DEPAKOTE) 250 MG DR tablet TAKE 1 TABLET BY MOUTH THREE TIMES DAILY. 90 tablet 0   escitalopram (LEXAPRO) 20 MG tablet Take 1 tablet (20 mg total) by mouth daily. 90 tablet 3   levalbuterol (XOPENEX HFA) 45 MCG/ACT inhaler INHALE 1 TO 2 PUFFS BY MOUTH EVERY 4 TO 6 HOURS AS NEEDED FOR WHEEZING 15 g 0   Nebulizers (MY MDI PORTABLE NEBULISER) MISC 1 application by Does not apply route as needed. 1 each 0   oxybutynin (DITROPAN-XL) 5 MG 24 hr tablet Take 5 mg by mouth daily.     pravastatin (PRAVACHOL) 20 MG tablet TAKE 1 TABLET(20 MG) BY MOUTH DAILY 90 tablet 2   triamcinolone (KENALOG) 0.025 % ointment Apply 1 application topically 3 (three) times daily. 90 g 0   UNABLE TO FIND 1 application by Other route daily. Wrist splint for carpal tunnel syndrome: Apply it daily. 1 each 0   verapamil (CALAN) 120 MG tablet Take 1 tablet (120 mg total) by mouth daily. 90 tablet 1   ibuprofen (ADVIL) 800 MG tablet Take 1 tablet (800 mg total) by mouth 3 (three) times daily. (Patient not taking: Reported on 04/12/2021) 21 tablet 0   nystatin (MYCOSTATIN) 100000 UNIT/ML suspension Take 5 mLs (500,000 Units total) by mouth 4 (four) times daily. (Patient not taking: Reported on 01/31/2021) 60 mL 0   omeprazole (PRILOSEC) 20 MG capsule Take 1 capsule (20 mg total) by mouth daily. (Patient not taking: Reported on 04/12/2021) 7 capsule 0   No facility-administered medications prior to visit.    Allergies  Allergen Reactions   Bee Venom Swelling    ROS Review of Systems  Constitutional:  Positive for fatigue. Negative for chills and fever.  HENT:  Negative for congestion, sinus pressure, sinus pain and sore throat.   Eyes:  Negative for pain and discharge.  Respiratory:  Negative  for cough and shortness of breath.   Cardiovascular:  Negative for chest pain and palpitations.  Gastrointestinal:  Negative for abdominal pain, diarrhea, nausea and vomiting.  Endocrine: Negative for polydipsia and polyuria.  Genitourinary:  Negative for dysuria and hematuria.  Musculoskeletal:  Negative for neck pain and neck stiffness.  Skin:  Negative for rash.  Neurological:  Negative for dizziness and weakness.  Psychiatric/Behavioral:  Positive for sleep disturbance.  Negative for agitation and behavioral problems.      Objective:    Physical Exam Vitals reviewed.  Constitutional:      General: She is not in acute distress.    Appearance: She is obese. She is not diaphoretic.  HENT:     Head: Normocephalic and atraumatic.     Nose: Nose normal. No congestion.     Mouth/Throat:     Mouth: Mucous membranes are moist.     Pharynx: No posterior oropharyngeal erythema.  Eyes:     General: No scleral icterus.    Extraocular Movements: Extraocular movements intact.  Cardiovascular:     Rate and Rhythm: Normal rate and regular rhythm.     Pulses: Normal pulses.     Heart sounds: Normal heart sounds. No murmur heard. Pulmonary:     Breath sounds: Normal breath sounds. No wheezing or rales.  Musculoskeletal:     Cervical back: Neck supple. No tenderness.     Right lower leg: No edema.     Left lower leg: No edema.  Skin:    General: Skin is warm.     Findings: No rash.  Neurological:     General: No focal deficit present.     Mental Status: She is alert and oriented to person, place, and time.     Motor: Weakness (RUE - 4/5, RLE - 4/5) present.  Psychiatric:        Mood and Affect: Mood normal.        Behavior: Behavior normal.    BP 112/72 (BP Location: Right Arm, Patient Position: Sitting, Cuff Size: Normal)    Pulse (!) 52    Resp 18    Ht _0  (1.575 m)    Wt 205 lb 0.6 oz (93 kg)    SpO2 99%    BMI 37.50 kg/m  Wt Readings from Last 3 Encounters:  04/12/21 205 lb  0.6 oz (93 kg)  01/31/21 208 lb 6.4 oz (94.5 kg)  01/02/21 205 lb (93 kg)    Lab Results  Component Value Date   TSH 1.840 09/21/2020   Lab Results  Component Value Date   WBC 7.7 09/21/2020   HGB 13.4 09/21/2020   HCT 40.5 09/21/2020   MCV 92 09/21/2020   PLT 237 09/21/2020   Lab Results  Component Value Date   NA 142 01/02/2021   K 3.9 01/02/2021   CO2 22 01/02/2021   GLUCOSE 94 01/02/2021   BUN 15 01/02/2021   CREATININE 0.84 01/02/2021   BILITOT <0.2 09/21/2020   ALKPHOS 97 09/21/2020   AST 13 09/21/2020   ALT 18 09/21/2020   PROT 6.7 09/21/2020   ALBUMIN 4.1 09/21/2020   CALCIUM 9.6 01/02/2021   ANIONGAP 7 03/07/2016   EGFR 82 01/02/2021   Lab Results  Component Value Date   CHOL 170 09/09/2018   Lab Results  Component Value Date   HDL 47 (L) 09/09/2018   Lab Results  Component Value Date   LDLCALC 101 (H) 09/09/2018   Lab Results  Component Value Date   TRIG 120 09/09/2018   Lab Results  Component Value Date   CHOLHDL 3.6 09/09/2018   Lab Results  Component Value Date   HGBA1C 5.9 (H) 01/02/2021      Assessment & Plan:   Problem List Items Addressed This Visit       Cardiovascular and Mediastinum   Essential hypertension    BP Readings from Last 1 Encounters:  04/12/21 112/72  Well-controlled  with Verapamil Counseled for compliance with the medications Advised DASH diet and moderate exercise/walking, at least 150 mins/week         Respiratory   Obstructive sleep apnea syndrome    Chronic fatigue, snoring and daytime somnolence concerning for OSA Will get home sleep study      Relevant Orders   Home sleep test     Nervous and Auditory   Seizure disorder ([place])    Takes Depakote Followed by neurology      CVA, old, hemiparesis ([place])    With right hemiparesis On aspirin and statin Followed by Neurology      Relevant Orders   Lipid Profile     Other   Morbid obesity due to excess calories ([place])    Diet  modification and moderate exercise/walking advised      Fatigue - Primary    Chronic fatigue CMP and TSH reviewed from chart, WNL She reports snoring and daytime fatigue, will check sleep study to rule out OSA      Relevant Orders   CMP14+EGFR   TSH + free T4   Depression, major, single episode, mild ([place])    Well controlled with Lexapro      Prediabetes    Lab Results  Component Value Date   HGBA1C 5.9 (H) 01/02/2021  Advised to follow low-carb diet for now      Relevant Orders   Hemoglobin A1c    No orders of the defined types were placed in this encounter.   Follow-up: Return in about 6 months (around 10/10/2021) for HTN and CVA.    Lindell Spar, MD

## 2021-04-12 NOTE — Assessment & Plan Note (Addendum)
Takes Depakote Followed by neurology 

## 2021-04-12 NOTE — Assessment & Plan Note (Signed)
Lab Results  Component Value Date   HGBA1C 5.9 (H) 01/02/2021   Advised to follow low-carb diet for now

## 2021-04-12 NOTE — Assessment & Plan Note (Signed)
Well controlled with Lexapro ?

## 2021-04-12 NOTE — Patient Instructions (Addendum)
Please continue to take medications as prescribed.  Please continue to follow low salt diet and perform moderate exercise/walking as tolerated.  Please get fasting blood tests done before the next visit.  Please consider getting Shingrix vaccine at local pharmacy.

## 2021-04-12 NOTE — Assessment & Plan Note (Signed)
With right hemiparesis On aspirin and statin Followed by Neurology

## 2021-04-12 NOTE — Assessment & Plan Note (Signed)
Chronic fatigue CMP and TSH reviewed from chart, WNL She reports snoring and daytime fatigue, will check sleep study to rule out OSA

## 2021-04-12 NOTE — Assessment & Plan Note (Signed)
Diet modification and moderate exercise/walking advised 

## 2021-04-12 NOTE — Assessment & Plan Note (Signed)
Chronic fatigue, snoring and daytime somnolence concerning for OSA Will get home sleep study

## 2021-04-12 NOTE — Assessment & Plan Note (Signed)
BP Readings from Last 1 Encounters:  04/12/21 112/72   Well-controlled with Verapamil Counseled for compliance with the medications Advised DASH diet and moderate exercise/walking, at least 150 mins/week

## 2021-04-16 ENCOUNTER — Other Ambulatory Visit: Payer: Self-pay | Admitting: *Deleted

## 2021-04-16 DIAGNOSIS — J4540 Moderate persistent asthma, uncomplicated: Secondary | ICD-10-CM

## 2021-04-16 MED ORDER — LEVALBUTEROL TARTRATE 45 MCG/ACT IN AERO: INHALATION_SPRAY | RESPIRATORY_TRACT | 0 refills | Status: DC

## 2021-05-06 ENCOUNTER — Other Ambulatory Visit: Payer: Self-pay | Admitting: Internal Medicine

## 2021-05-06 DIAGNOSIS — J4540 Moderate persistent asthma, uncomplicated: Secondary | ICD-10-CM

## 2021-05-07 ENCOUNTER — Encounter: Payer: Medicare Other | Admitting: Nurse Practitioner

## 2021-05-10 ENCOUNTER — Ambulatory Visit (INDEPENDENT_AMBULATORY_CARE_PROVIDER_SITE_OTHER): Payer: Medicare Other | Admitting: Internal Medicine

## 2021-05-10 ENCOUNTER — Other Ambulatory Visit: Payer: Self-pay

## 2021-05-10 ENCOUNTER — Encounter: Payer: Self-pay | Admitting: Internal Medicine

## 2021-05-10 DIAGNOSIS — R300 Dysuria: Secondary | ICD-10-CM | POA: Diagnosis not present

## 2021-05-10 DIAGNOSIS — N3000 Acute cystitis without hematuria: Secondary | ICD-10-CM | POA: Diagnosis not present

## 2021-05-10 LAB — POCT URINALYSIS DIP (CLINITEK)
Bilirubin, UA: NEGATIVE
Glucose, UA: NEGATIVE mg/dL
Ketones, POC UA: NEGATIVE mg/dL
Nitrite, UA: NEGATIVE
POC PROTEIN,UA: 100 — AB
Spec Grav, UA: 1.025 (ref 1.010–1.025)
Urobilinogen, UA: 0.2 E.U./dL
pH, UA: 7 (ref 5.0–8.0)

## 2021-05-10 MED ORDER — SULFAMETHOXAZOLE-TRIMETHOPRIM 800-160 MG PO TABS: 1.0000 | ORAL_TABLET | Freq: Two times a day (BID) | ORAL | 0 refills | Status: DC

## 2021-05-10 NOTE — Progress Notes (Signed)
Virtual Visit via Telephone Note   This visit type was conducted due to national recommendations for restrictions regarding the COVID-19 Pandemic (e.g. social distancing) in an effort to limit this patient's exposure and mitigate transmission in our community.  Due to her co-morbid illnesses, this patient is at least at moderate risk for complications without adequate follow up.  This format is felt to be most appropriate for this patient at this time.  The patient did not have access to video technology/had technical difficulties with video requiring transitioning to audio format only (telephone).  All issues noted in this document were discussed and addressed.  No physical exam could be performed with this format.   Evaluation Performed:  Follow-up visit  Date:  05/10/2021   ID:  Patty [name], DOB 12-23-1965, MRN [id number]  Patient Location: Home Provider Location: Office/Clinic  Participants: Patient Location of Patient: Home Location of Provider: Telehealth Consent was obtain for visit to be over via telehealth. I verified that I am speaking with the correct person using two identifiers.  PCP:  Lindell Spar, MD   Chief Complaint: Dysuria and urinary frequency  History of Present Illness:    Patty [name] is a 56 y.o. female who has a televisit for complaint of dysuria and urinary frequency for the last 1 week.  She denies any fever, chills, nausea or vomiting currently.  Denies any hematuria.  The patient does not have symptoms concerning for COVID-19 infection (fever, chills, cough, or new shortness of breath).   Past Medical, Surgical, Social History, Allergies, and Medications have been Reviewed.  Past Medical History:  Diagnosis Date   Acute respiratory failure with hypoxia ([place]) 10/18/2014   Asthma    Cholecystitis with cholelithiasis 07/12/2014   Cholelithiasis with acute cholecystitis 07/13/2014   DDD (degenerative disc disease), lumbar    Depression    HTN  (hypertension)    Hyperlipidemia    Rectal bleed 11/13/2011   Seizures ([place])    remote   Stroke [facility]) 1990   chronic right hemiparesis   Past Surgical History:  Procedure Laterality Date   CESAREAN SECTION     CHOLECYSTECTOMY N/A 07/13/2014   Procedure: LAPAROSCOPIC CONVERTED TO OPEN  CHOLECYSTECTOMY ;  Surgeon: Armandina Gemma, MD;  Location: [facility];  Service: General;  Laterality: N/A;   COLONOSCOPY  12/24/2011   Procedure: COLONOSCOPY;  Surgeon: Danie Binder, MD;  [facility];  Service: Endoscopy;  Laterality: N/A;  2:00   GALLBLADDER SURGERY     [name]   PARTIAL HYSTERECTOMY     heavy bleeding     Current Meds  Medication Sig   albuterol (ACCUNEB) 0.63 MG/3ML nebulizer solution Take 3 mLs (0.63 mg total) by nebulization every 6 (six) hours as needed for wheezing or shortness of breath.   aspirin EC 81 MG tablet Take 1 tablet (81 mg total) by mouth daily.   budesonide-formoterol (SYMBICORT) 80-4.5 MCG/ACT inhaler INHALE 2 PUFFS BY MOUTH EVERY 12 HOURS   divalproex (DEPAKOTE) 250 MG DR tablet TAKE 1 TABLET BY MOUTH THREE TIMES DAILY.   escitalopram (LEXAPRO) 20 MG tablet Take 1 tablet (20 mg total) by mouth daily.   levalbuterol (XOPENEX HFA) 45 MCG/ACT inhaler INHALE 1 TO 2 PUFFS BY MOUTH EVERY 4 TO 6 HOURS AS NEEDED FOR WHEEZING   Nebulizers (MY MDI PORTABLE NEBULISER) MISC 1 application by Does not apply route as needed.   oxybutynin (DITROPAN-XL) 5 MG 24 hr tablet Take 5 mg  by mouth daily.   pravastatin (PRAVACHOL) 20 MG tablet TAKE 1 TABLET(20 MG) BY MOUTH DAILY   sulfamethoxazole-trimethoprim (BACTRIM DS) 800-160 MG tablet Take 1 tablet by mouth 2 (two) times daily.   triamcinolone (KENALOG) 0.025 % ointment Apply 1 application topically 3 (three) times daily.   UNABLE TO FIND 1 application by Other route daily. Wrist splint for carpal tunnel syndrome: Apply it daily.   verapamil (CALAN) 120 MG tablet Take 1 tablet (120 mg total) by mouth daily.      Allergies:   Bee venom   ROS:   Please see the history of present illness.     All other systems reviewed and are negative.   Labs/Other Tests and Data Reviewed:    Recent Labs: 09/21/2020: ALT 18; Hemoglobin 13.4; Platelets 237; TSH 1.840 01/02/2021: BUN 15; Creatinine, Ser 0.84; Potassium 3.9; Sodium 142   Recent Lipid Panel Lab Results  Component Value Date/Time   CHOL 170 09/09/2018 07:50 AM   TRIG 120 09/09/2018 07:50 AM   HDL 47 (L) 09/09/2018 07:50 AM   CHOLHDL 3.6 09/09/2018 07:50 AM   LDLCALC 101 (H) 09/09/2018 07:50 AM    Wt Readings from Last 3 Encounters:  04/12/21 205 lb 0.6 oz (93 kg)  01/31/21 208 lb 6.4 oz (94.5 kg)  01/02/21 205 lb (93 kg)    ASSESSMENT & PLAN:    UTI UA reviewed, will check urine culture Started empiric Bactrim Advised to improve hydration  Time:   Today, I have spent 9 minutes reviewing the chart, including problem list, medications, and with the patient with telehealth technology discussing the above problems.   Medication Adjustments/Labs and Tests Ordered: Current medicines are reviewed at length with the patient today.  Concerns regarding medicines are outlined above.   Tests Ordered: Orders Placed This Encounter  Procedures   Urine Culture   POCT URINALYSIS DIP (CLINITEK)    Medication Changes: Meds ordered this encounter  Medications   sulfamethoxazole-trimethoprim (BACTRIM DS) 800-160 MG tablet    Sig: Take 1 tablet by mouth 2 (two) times daily.    Dispense:  10 tablet    Refill:  0     Note: This dictation was prepared with Dragon dictation along with smaller phrase technology. Similar sounding words can be transcribed inadequately or may not be corrected upon review. Any transcriptional errors that result from this process are unintentional.      Disposition:  Follow up  Signed, Lindell Spar, MD  05/10/2021 5:01 PM     [place]

## 2021-05-15 ENCOUNTER — Other Ambulatory Visit: Payer: Self-pay | Admitting: Internal Medicine

## 2021-05-15 DIAGNOSIS — N3000 Acute cystitis without hematuria: Secondary | ICD-10-CM

## 2021-05-15 LAB — URINE CULTURE

## 2021-05-15 MED ORDER — NITROFURANTOIN MONOHYD MACRO 100 MG PO CAPS: 100.0000 mg | ORAL_CAPSULE | Freq: Two times a day (BID) | ORAL | 0 refills | Status: DC

## 2021-05-17 ENCOUNTER — Ambulatory Visit (INDEPENDENT_AMBULATORY_CARE_PROVIDER_SITE_OTHER): Payer: Medicare Other | Admitting: Internal Medicine

## 2021-05-17 ENCOUNTER — Encounter (INDEPENDENT_AMBULATORY_CARE_PROVIDER_SITE_OTHER): Payer: Self-pay

## 2021-05-17 ENCOUNTER — Other Ambulatory Visit: Payer: Self-pay

## 2021-05-17 ENCOUNTER — Encounter: Payer: Self-pay | Admitting: Internal Medicine

## 2021-05-17 ENCOUNTER — Telehealth: Payer: Self-pay | Admitting: Internal Medicine

## 2021-05-17 DIAGNOSIS — F321 Major depressive disorder, single episode, moderate: Secondary | ICD-10-CM

## 2021-05-17 MED ORDER — FLUOXETINE HCL 10 MG PO TABS: 10.0000 mg | ORAL_TABLET | Freq: Every day | ORAL | 2 refills | Status: DC

## 2021-05-17 NOTE — Telephone Encounter (Signed)
Called in regard to depression meds   States that patient is more depressed , current med is not helping    escitalopram (LEXAPRO) 20 MG tablet   Wants to switch to a new medication  Can call patient back on  [phone]

## 2021-05-17 NOTE — Patient Instructions (Signed)
Please start taking half tablet of Lexapro for 1 week. Then, start taking Prozac from following week.  You are being referred to [facility] therapy.

## 2021-05-17 NOTE — Telephone Encounter (Signed)
Pt will need a visit can be virtual to switch meds please schedule

## 2021-05-17 NOTE — Telephone Encounter (Signed)
APPT 2/16 4:40
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
## 2021-05-28 ENCOUNTER — Other Ambulatory Visit: Payer: Self-pay | Admitting: Internal Medicine

## 2021-05-28 DIAGNOSIS — J4540 Moderate persistent asthma, uncomplicated: Secondary | ICD-10-CM

## 2021-05-30 ENCOUNTER — Ambulatory Visit (INDEPENDENT_AMBULATORY_CARE_PROVIDER_SITE_OTHER): Payer: Medicare Other | Admitting: Licensed Clinical Social Worker

## 2021-05-30 ENCOUNTER — Telehealth: Payer: Self-pay | Admitting: *Deleted

## 2021-05-30 DIAGNOSIS — F321 Major depressive disorder, single episode, moderate: Secondary | ICD-10-CM

## 2021-05-30 DIAGNOSIS — I10 Essential (primary) hypertension: Secondary | ICD-10-CM

## 2021-05-30 NOTE — Patient Instructions (Addendum)
Visit Information  ? ?Thank you for taking time to visit with me today. Please don't hesitate to contact me if I can be of assistance to you before our next scheduled telephone appointment. ? ?Following are the goals we discussed today:  Getting you connected for ongoing therapy ?The care guide will call you to schedule a follow up phone appointment. ? ?Please call the care guide team at [phone] if you need to talk prior to your appointment.  ? ?If you are experiencing a Mental Health or [place] or need someone to talk to, please call the [place] National Suicide Prevention Lifeline: 508-839-2181 or TTY: 802-281-1372 TTY 214-876-1740) to talk to a trained counselor ?call 1-800-273-TALK (toll free, 24 hour hotline) ?call 911  ? ?Following is a copy of your full care plan:  ?Care Plan : [place]  ?Updates made by Maurine Cane, LCSW since 05/30/2021 12:00 AM  ?  ? ?Problem: Coping skills and managing symptoms of depression   ?  ? ?Goal: Coping Skills Enhanced by connecting for ongoing therapy   ?Start Date: 05/30/2021  ?This Visit's Progress: On track  ?Priority: High  ?Note:   ?Current Barriers:  ?Disease Management support and education needs related to Depression: depressed mood ?disturbed sleep ? ?CSW Clinical Goal(s):  ?Patient  demonstrate a reduction in symptoms related to :symptoms of depression by connecting for ongoing therapy  through collaboration with Clinical Social Worker, provider, and care team.  ? ?Interventions: ?1:1 collaboration with primary care provider regarding development and update of comprehensive plan of care as evidenced by provider attestation and co-signature ?Inter-disciplinary care team collaboration (see longitudinal plan of care) ?Evaluation of current treatment plan related to  self management and patient's adherence to plan as established by provider ?Review resources, discussed options and provided patient information about  ?Department of Social  Services ( currently receiving services ) ?Community Alternative Program  (CAP) ?No needs identified ? ?Mental Health:  (Status: New goal.) ?Evaluation of current treatment plan related to  managing symptoms of depression  ?Depression screen reviewed  ?Solution-Focused Strategies employed:  ?Provided psychoeducation for mental health needs  ?Reviewed mental health medications and discussed importance of compliance: reports no missed doses  ?Quality of sleep assessed & Sleep Hygiene techniques promoted  ?Suicidal Ideation/Homicidal Ideation assessed: denies ?Collaborated with [place] at [place] ?Made referral to [facility]  please call them if no one has called you in 1 week [phone] ? ?Patient Self-Care Activities: ?I have placed a referral [place] at [facility]  they will contact you.   ?Review sleep hygiene information mailed  ?  ? ? ?Consent to CCM Services: ?Ms. Clayborn was given information about Chronic Care Management services including:  ?CCM service includes personalized support from designated clinical staff supervised by her physician, including individualized plan of care and coordination with other care providers ?24/7 contact phone numbers for assistance for urgent and routine care needs. ?Service will only be billed when office clinical staff spend 20 minutes or more in a month to coordinate care. ?Only one practitioner may furnish and bill the service in a calendar month. ?The patient may stop CCM services at any time (effective at the end of the month) by phone call to the office staff. ?The patient will be responsible for cost sharing (co-pay) of up to 20% of the service fee (after annual deductible is met). ? ?Patient agreed to services and verbal consent obtained.  ? ?The patient verbalized understanding  of instructions, educational materials, and care plan provided today and agreed to receive a mailed copy of patient instructions, educational  materials, and care plan.  ? ?Casimer Lanius, LCSW ?Licensed Clinical Social Worker [facility]  ?CCM LCSW Coverage ?[phone]  ? ? ?  ?

## 2021-05-30 NOTE — Chronic Care Management (AMB) (Signed)
?Chronic Care Management  ? Clinical Social Work Note ? ?05/30/2021 ?Name: Patty [name] MRN: [id number] DOB: [birth year] ? ?Patty [name] is a 56 y.o. year old female who is a primary care patient of Lindell Spar, MD. The CCM team was consulted to assist the patient with chronic disease management and/or care coordination needs related to: Mental Health Counseling and Resources.  ? ?Engaged with patient by telephone for initial visit in response to provider referral for social work chronic care management and care coordination services.  ? ?Consent to Services:  ?The patient was given the following information about Chronic Care Management services today, agreed to services, and gave verbal consent: 1. CCM service includes personalized support from designated clinical staff supervised by the primary care provider, including individualized plan of care and coordination with other care providers 2. 24/7 contact phone numbers for assistance for urgent and routine care needs. 3. Service will only be billed when office clinical staff spend 20 minutes or more in a month to coordinate care. 4. Only one practitioner may furnish and bill the service in a calendar month. 5.The patient may stop CCM services at any time (effective at the end of the month) by phone call to the office staff. 6. The patient will be responsible for cost sharing (co-pay) of up to 20% of the service fee (after annual deductible is met). Patient agreed to services and consent obtained. ? ?Patient agreed to services and consent obtained.  ? ?Summary: Assessed patient's previous and current treatment, coping skills, support system and barriers to care. She is currently experiencing symptoms of  depression which seems to be exacerbated by stress of having to move to a new home.  Has support as her daughter provides personal care services for her up to 8 hours per day. She currently uses deep breathing to help calm down. Express concerns with sleep  difficulties..  See Care Plan below for interventions and patient self-care actives. ? ?Recommendation: Patient may benefit from, and is in agreement to implement sleep hygiene ( information mailed) and allow LCSW to make referral to [facility] for therapy .  ? ?Follow up Plan:  ?No follow up scheduled with CCM LCSW at this time. Patient will call office if needed. ?Will route chart to Care Guide to reschedule follow up phone appointment   ?  ?Assessment: Review of patient past medical history, allergies, medications, and health status, including review of relevant consultants reports was performed today as part of a comprehensive evaluation and provision of chronic care management and care coordination services.    ? ?SDOH (Social Determinants of Health) assessments and interventions performed:  ?SDOH Interventions   ? ?Flowsheet Row Most Recent Value  ?SDOH Interventions   ?SDOH Interventions for the Following Domains Depression  ?Stress Interventions Provide Counseling  ?Transportation Interventions --  [drive self to appointments]  ?Depression Interventions/Treatment  Counseling, Currently on Treatment  ? ?  ?  ? ?Advanced Directives Status: Not addressed in this encounter. ? ?CCM Care Plan ?Conditions to be addressed/monitored: Depression;  ? ?Care Plan : LCSW Plan of Care  ?Updates made by Maurine Cane, LCSW since 05/30/2021 12:00 AM  ?  ? ?Problem: Coping skills and managing symptoms of depression   ?  ? ?Goal: Coping Skills Enhanced by connecting for ongoing therapy   ?Start Date: 05/30/2021  ?This Visit's Progress: On track  ?Priority: High  ?Note:   ?Current Barriers:  ?Disease Management support and education needs related to  Depression: depressed mood ?disturbed sleep ? ?CSW Clinical Goal(s):  ?Patient  demonstrate a reduction in symptoms related to :symptoms of depression by connecting for ongoing therapy  through collaboration with Clinical Social Worker, provider, and care team.   ? ?Interventions: ?1:1 collaboration with primary care provider regarding development and update of comprehensive plan of care as evidenced by provider attestation and co-signature ?Inter-disciplinary care team collaboration (see longitudinal plan of care) ?Evaluation of current treatment plan related to  self management and patient's adherence to plan as established by provider ?Review resources, discussed options and provided patient information about  ?Department of Social Services ( currently receiving services ) ?Community Alternative Program  (CAP) ?No needs identified ? ?Mental Health:  (Status: New goal.) ?Evaluation of current treatment plan related to  managing symptoms of depression  ?Depression screen reviewed  ?Solution-Focused Strategies employed:  ?Provided psychoeducation for mental health needs  ?Reviewed mental health medications and discussed importance of compliance: reports no missed doses  ?Quality of sleep assessed & Sleep Hygiene techniques promoted  ?Suicidal Ideation/Homicidal Ideation assessed: denies ?Collaborated with [place] at [place] ?Made referral to [facility]  please call them if no one has called you in 1 week [phone] ? ?Patient Self-Care Activities: ?I have placed a referral [place] at [facility]  they will contact you.   ?Review sleep hygiene information mailed  ?  ? Casimer Lanius, LCSW ?Licensed Clinical Social Worker [facility]  ?CCM LCSW Coverage ?[phone]  ? ?

## 2021-05-30 NOTE — Chronic Care Management (AMB) (Signed)
?  Chronic Care Management  ? ?Note ? ?05/30/2021 ?Name: Patty [name] MRN: [id number] DOB: 04-30-1965 ? ?Patty [name] is a 56 y.o. year old female who is a primary care patient of Lindell Spar, MD. I reached out to Caro Hight by phone today in response to a referral sent by Ms. Lancaster PCP. ? ?Ms. Gunderman was given information about Chronic Care Management services today including:  ?CCM service includes personalized support from designated clinical staff supervised by her physician, including individualized plan of care and coordination with other care providers ?24/7 contact phone numbers for assistance for urgent and routine care needs. ?Service will only be billed when office clinical staff spend 20 minutes or more in a month to coordinate care. ?Only one practitioner may furnish and bill the service in a calendar month. ?The patient may stop CCM services at any time (effective at the end of the month) by phone call to the office staff. ?The patient is responsible for co-pay (up to 20% after annual deductible is met) if co-pay is required by the individual health plan.  ? ?Patient agreed to services and verbal consent obtained.  ? ?Follow up plan: ?Telephone appointment with care management team member scheduled for:05/30/21 ? ?Laverda Sorenson  ?Care Guide, Embedded Care Coordination ?[place]  Care Management  ?Direct Dial: [phone] ? ?

## 2021-06-01 ENCOUNTER — Telehealth (HOSPITAL_COMMUNITY): Payer: Self-pay | Admitting: Psychiatry

## 2021-06-01 NOTE — Telephone Encounter (Signed)
Called to schedule NP appt patient did not have time to schedule she did advise she would call back to schedule  ?

## 2021-06-18 ENCOUNTER — Other Ambulatory Visit: Payer: Self-pay | Admitting: Internal Medicine

## 2021-06-18 ENCOUNTER — Telehealth: Payer: Self-pay | Admitting: *Deleted

## 2021-06-18 DIAGNOSIS — J4540 Moderate persistent asthma, uncomplicated: Secondary | ICD-10-CM

## 2021-06-18 NOTE — Chronic Care Management (AMB) (Signed)
?  Care Management  ? ?Note ? ?06/18/2021 ?Name: MILLA WAHLBERG MRN: [id number] DOB: 1965/11/09 ? ?Darielys NEEKA URISTA is a 56 y.o. year old female who is a primary care patient of Anabel Halon, MD and is actively engaged with the care management team. I reached out to [name] by phone today to assist with scheduling a follow up visit with the Licensed Clinical Social Worker ? ?Follow up plan: ?Unsuccessful telephone outreach attempt made. The care management team will reach out to the patient again over the next 7 days. If patient returns call to provider office, please advise to call Embedded Care Management Care Guide Misty Stanley at [phone]. ? ?Gwenevere Ghazi  ?Care Guide, Embedded Care Coordination ?[place]  Care Management  ?Direct Dial: [phone] ? ?

## 2021-06-25 ENCOUNTER — Ambulatory Visit: Payer: Medicare Other | Admitting: Orthopedic Surgery

## 2021-06-25 NOTE — Chronic Care Management (AMB) (Signed)
?  Care Management  ? ?Note ? ?06/25/2021 ?Name: ELAYNE GRUVER MRN: [id number] DOB: [birth year] ? ?Gracelyn NAYVIE LIPS is a 56 y.o. year old female who is a primary care patient of Anabel Halon, MD and is actively engaged with the care management team. I reached out to [name] by phone today to assist with scheduling a follow up visit with the Licensed Clinical Social Worker ? ?Follow up plan: ?Unsuccessful telephone outreach attempt made. A HIPAA compliant phone message was left for the patient providing contact information and requesting a return call.  ?The care management team will reach out to the patient again over the next 7 days.  ?If patient returns call to provider office, please advise to call Embedded Care Management Care Guide Misty Stanley at [phone]. ? ?Gwenevere Ghazi  ?Care Guide, Embedded Care Coordination ?[place]  Care Management  ?Direct Dial: [phone] ? ?

## 2021-06-26 NOTE — Chronic Care Management (AMB) (Signed)
?  Care Management  ? ?Note ? ?06/26/2021 ?Name: Patty [name] MRN: [id number] DOB: 05-27-1965 ? ?Patty [name] is a 56 y.o. year old female who is a primary care patient of Anabel Halon, MD and is actively engaged with the care management team. I reached out to [name] by phone today to assist with scheduling a follow up visit with the Licensed Clinical Social Worker ? ?Follow up plan: ?Telephone appointment with care management team member scheduled for:07/10/21 ? ?Gwenevere Ghazi  ?Care Guide, Embedded Care Coordination ?[place]  Care Management  ?Direct Dial: [phone] ? ?

## 2021-06-29 DIAGNOSIS — F321 Major depressive disorder, single episode, moderate: Secondary | ICD-10-CM

## 2021-06-29 DIAGNOSIS — I10 Essential (primary) hypertension: Secondary | ICD-10-CM

## 2021-07-10 ENCOUNTER — Telehealth: Payer: Medicare Other

## 2021-07-16 ENCOUNTER — Other Ambulatory Visit: Payer: Self-pay | Admitting: *Deleted

## 2021-07-16 ENCOUNTER — Telehealth: Payer: Self-pay

## 2021-07-16 DIAGNOSIS — I10 Essential (primary) hypertension: Secondary | ICD-10-CM

## 2021-07-16 MED ORDER — VERAPAMIL HCL 120 MG PO TABS: 120.0000 mg | ORAL_TABLET | Freq: Every day | ORAL | 1 refills | Status: DC

## 2021-07-16 NOTE — Telephone Encounter (Signed)
Patient need med refill  verapamil (CALAN) 120 MG tablet   Pharmacy: [facility] [address] [place]

## 2021-07-16 NOTE — Telephone Encounter (Signed)
Medication sent to pharmacy  

## 2021-07-19 ENCOUNTER — Telehealth: Payer: Self-pay | Admitting: *Deleted

## 2021-07-19 NOTE — Chronic Care Management (AMB) (Signed)
?  Care Management  ? ?Note ? ?07/19/2021 ?Name: Patty [name] MRN: [id number] DOB: 1965/04/07 ? ?Patty [name] is a 56 y.o. year old female who is a primary care patient of Anabel Halon, MD and is actively engaged with the care management team. I reached out to [name] by phone today to assist with re-scheduling a follow up visit with the Licensed Clinical Social Worker ? ?Follow up plan: ?Patient declines further follow up and engagement by the care management team. Appropriate care team members and provider have been notified via electronic communication. The care management team is available to follow up with the patient after provider conversation with the patient regarding recommendation for care management engagement and subsequent re-referral to the care management team.  ? ?Gwenevere Ghazi  ?Care Guide, Embedded Care Coordination ?[place]  Care Management  ?Direct Dial: [phone] ? ?

## 2021-07-20 ENCOUNTER — Ambulatory Visit (INDEPENDENT_AMBULATORY_CARE_PROVIDER_SITE_OTHER): Payer: Medicare Other | Admitting: Family Medicine

## 2021-07-20 ENCOUNTER — Encounter: Payer: Self-pay | Admitting: Family Medicine

## 2021-07-20 DIAGNOSIS — K2900 Acute gastritis without bleeding: Secondary | ICD-10-CM

## 2021-07-20 MED ORDER — ONDANSETRON HCL 4 MG PO TABS: 4.0000 mg | ORAL_TABLET | Freq: Three times a day (TID) | ORAL | 0 refills | Status: DC | PRN

## 2021-07-20 MED ORDER — DICYCLOMINE HCL 10 MG PO CAPS: 10.0000 mg | ORAL_CAPSULE | Freq: Four times a day (QID) | ORAL | 1 refills | Status: DC | PRN

## 2021-07-20 NOTE — Progress Notes (Signed)
?  ? ?Virtual Visit via Telephone Note  ? ?This visit type was conducted due to national recommendations for restrictions regarding the COVID-19 Pandemic (e.g. social distancing) in an effort to limit this patient's exposure and mitigate transmission in our community.  Due to her co-morbid illnesses, this patient is at least at moderate risk for complications without adequate follow up.  This format is felt to be most appropriate for this patient at this time.  The patient did not have access to video technology/had technical difficulties with video requiring transitioning to audio format only (telephone).  All issues noted in this document were discussed and addressed.  No physical exam could be performed with this format.  Please refer to the patient's chart for her  consent to telehealth for [facility].  ? ?Evaluation Performed:  Acute Visit ? ?Date:  07/20/2021  ? ?ID:  Patty [name], DOB April 27, 1965, MRN [id number] ? ?Patient Location: Home ?Provider Location: Office/Clinic ? ?Participants: Patient ?Location of Patient: Home ?Location of Provider: Telehealth ?Consent was obtain for visit to be over via telehealth. ?I verified that I am speaking with the correct person using two identifiers. ? ?PCP:  Anabel Halon, MD  ? ?Chief Complaint:  abdominal pain ? ?History of Present Illness:   ? ?Patty [name] is a 56 y.o. female who presents with abdominal pain, nausea, diarrhea, dizziness, fatigue, and headaches for 3 days. She reports having loose stools twice daily without blood or mucus. She reports taking aleve with mild relief. She admits to low fluid intake and denies fever, chills,vomiting and sick contacts.  ? ?The patient does not have symptoms concerning for COVID-19 infection (fever, chills, cough, or new shortness of breath).  ? ?Past Medical, Surgical, Social History, Allergies, and Medications have been Reviewed. ? ?Past Medical History:  ?Diagnosis Date  ? Acute respiratory failure with hypoxia (HCC)  10/18/2014  ? Asthma   ? Cholecystitis with cholelithiasis 07/12/2014  ? Cholelithiasis with acute cholecystitis 07/13/2014  ? DDD (degenerative disc disease), lumbar   ? Depression   ? HTN (hypertension)   ? Hyperlipidemia   ? Rectal bleed 11/13/2011  ? Seizures (HCC)   ? remote  ? Stroke [facility]) 1990  ? chronic right hemiparesis  ? ?Past Surgical History:  ?Procedure Laterality Date  ? CESAREAN SECTION    ? CHOLECYSTECTOMY N/A 07/13/2014  ? Procedure: LAPAROSCOPIC CONVERTED TO OPEN  CHOLECYSTECTOMY ;  Surgeon: Darnell Level, MD;  Location: [facility];  Service: General;  Laterality: N/A;  ? COLONOSCOPY  12/24/2011  ? Procedure: COLONOSCOPY;  Surgeon: West Bali, MD;  [facility];  Service: Endoscopy;  Laterality: N/A;  2:00  ? GALLBLADDER SURGERY    ? [name]  ? PARTIAL HYSTERECTOMY    ? heavy bleeding  ?  ? ?Current Meds  ?Medication Sig  ? albuterol (ACCUNEB) 0.63 MG/3ML nebulizer solution Take 3 mLs (0.63 mg total) by nebulization every 6 (six) hours as needed for wheezing or shortness of breath.  ? aspirin EC 81 MG tablet Take 1 tablet (81 mg total) by mouth daily.  ? budesonide-formoterol (SYMBICORT) 80-4.5 MCG/ACT inhaler INHALE 2 PUFFS BY MOUTH EVERY 12 HOURS  ? dicyclomine (BENTYL) 10 MG capsule Take 1 capsule (10 mg total) by mouth 4 (four) times daily as needed for spasms.  ? divalproex (DEPAKOTE) 250 MG DR tablet TAKE 1 TABLET BY MOUTH THREE TIMES DAILY.  ? FLUoxetine (PROZAC) 10 MG tablet Take 1 tablet (10 mg total) by mouth  daily.  ? levalbuterol (XOPENEX HFA) 45 MCG/ACT inhaler INHALE 1 TO 2 PUFFS BY MOUTH EVERY 4 TO 6 HOURS AS NEEDED FOR WHEEZING  ? Nebulizers (MY MDI PORTABLE NEBULISER) MISC 1 application by Does not apply route as needed.  ? nitrofurantoin, macrocrystal-monohydrate, (MACROBID) 100 MG capsule Take 1 capsule (100 mg total) by mouth 2 (two) times daily.  ? ondansetron (ZOFRAN) 4 MG tablet Take 1 tablet (4 mg total) by mouth every 8 (eight) hours as needed for nausea  or vomiting.  ? oxybutynin (DITROPAN-XL) 5 MG 24 hr tablet Take 5 mg by mouth daily.  ? pravastatin (PRAVACHOL) 20 MG tablet TAKE 1 TABLET(20 MG) BY MOUTH DAILY  ? triamcinolone (KENALOG) 0.025 % ointment Apply 1 application topically 3 (three) times daily.  ? UNABLE TO FIND 1 application by Other route daily. Wrist splint for carpal tunnel syndrome: Apply it daily.  ? verapamil (CALAN) 120 MG tablet Take 1 tablet (120 mg total) by mouth daily.  ?  ? ?Allergies:   Bee venom  ? ?ROS:   ?Please see the history of present illness.    ? ?All other systems reviewed and are negative. ? ? ?Labs/Other Tests and Data Reviewed:   ? ?Recent Labs: ?09/21/2020: ALT 18; Hemoglobin 13.4; Platelets 237; TSH 1.840 ?01/02/2021: BUN 15; Creatinine, Ser 0.84; Potassium 3.9; Sodium 142  ? ?Recent Lipid Panel ?Lab Results  ?Component Value Date/Time  ? CHOL 170 09/09/2018 07:50 AM  ? TRIG 120 09/09/2018 07:50 AM  ? HDL 47 (L) 09/09/2018 07:50 AM  ? CHOLHDL 3.6 09/09/2018 07:50 AM  ? LDLCALC 101 (H) 09/09/2018 07:50 AM  ? ? ?Wt Readings from Last 3 Encounters:  ?04/12/21 205 lb 0.6 oz (93 kg)  ?01/31/21 208 lb 6.4 oz (94.5 kg)  ?01/02/21 205 lb (93 kg)  ?  ? ?Objective:   ? ?Vital Signs:  There were no vitals taken for this visit.  ? ? ? ?ASSESSMENT & PLAN:   ?Acute Gastritis ?-Encouraged to stay hydrated by increasing fluid volume ?-Recommended supportive care and symptomatic management ?-Bentyl and Zofran ordered ? ?Time:   ?Today, I have spent 15 minutes reviewing the chart, including problem list, medications, and with the patient with telehealth technology discussing the above problems. ? ? ?Medication Adjustments/Labs and Tests Ordered: ?Current medicines are reviewed at length with the patient today.  Concerns regarding medicines are outlined above.  ? ?Tests Ordered: ?No orders of the defined types were placed in this encounter. ? ? ?Medication Changes: ?Meds ordered this encounter  ?Medications  ? dicyclomine (BENTYL) 10 MG capsule   ?  Sig: Take 1 capsule (10 mg total) by mouth 4 (four) times daily as needed for spasms.  ?  Dispense:  30 capsule  ?  Refill:  1  ? ondansetron (ZOFRAN) 4 MG tablet  ?  Sig: Take 1 tablet (4 mg total) by mouth every 8 (eight) hours as needed for nausea or vomiting.  ?  Dispense:  20 tablet  ?  Refill:  0  ? ? ? ?  ?  ? ? ?Disposition:  Follow up: as needed if symptoms worsens ?Signed, ?Gilmore Laroche, FNP  ?07/20/2021 11:56 AM    ? ?[facility] ?[facility] ?

## 2021-08-16 ENCOUNTER — Encounter: Payer: Self-pay | Admitting: Internal Medicine

## 2021-08-16 ENCOUNTER — Ambulatory Visit (INDEPENDENT_AMBULATORY_CARE_PROVIDER_SITE_OTHER): Payer: Medicare Other | Admitting: Internal Medicine

## 2021-08-16 VITALS — BP 132/74 | HR 94 | Resp 18 | Ht 62.0 in | Wt 195.0 lb

## 2021-08-16 DIAGNOSIS — J4541 Moderate persistent asthma with (acute) exacerbation: Secondary | ICD-10-CM

## 2021-08-16 DIAGNOSIS — I69359 Hemiplegia and hemiparesis following cerebral infarction affecting unspecified side: Secondary | ICD-10-CM | POA: Diagnosis not present

## 2021-08-16 DIAGNOSIS — R42 Dizziness and giddiness: Secondary | ICD-10-CM | POA: Diagnosis not present

## 2021-08-16 DIAGNOSIS — R1312 Dysphagia, oropharyngeal phase: Secondary | ICD-10-CM | POA: Diagnosis not present

## 2021-08-16 DIAGNOSIS — R053 Chronic cough: Secondary | ICD-10-CM

## 2021-08-16 DIAGNOSIS — R0989 Other specified symptoms and signs involving the circulatory and respiratory systems: Secondary | ICD-10-CM

## 2021-08-16 MED ORDER — METHYLPREDNISOLONE SODIUM SUCC 125 MG IJ SOLR
125.0000 mg | Freq: Once | INTRAMUSCULAR | Status: AC
  Administered 2021-08-16: 125 mg via INTRAMUSCULAR

## 2021-08-16 MED ORDER — ALBUTEROL SULFATE 0.63 MG/3ML IN NEBU: 1.0000 | INHALATION_SOLUTION | Freq: Four times a day (QID) | RESPIRATORY_TRACT | 3 refills | Status: DC | PRN

## 2021-08-16 MED ORDER — MECLIZINE HCL 25 MG PO TABS: 25.0000 mg | ORAL_TABLET | Freq: Three times a day (TID) | ORAL | 0 refills | Status: DC | PRN

## 2021-08-16 NOTE — Assessment & Plan Note (Signed)
Has recent worsening of dysphagia Referred to speech therapy for swallow eval

## 2021-08-16 NOTE — Assessment & Plan Note (Signed)
Has acute exacerbation Solu-Medrol in the office today Continue Symbicort and as needed Xopenex She has better response with albuterol nebulizer, prescription sent

## 2021-08-16 NOTE — Assessment & Plan Note (Signed)
Has positional dizziness, likely vertigo Meclizine as needed Advised to maintain adequate hydration Advised to eat at regular intervals Orthostatics negative in the office today

## 2021-08-16 NOTE — Assessment & Plan Note (Signed)
With right hemiparesis On aspirin and statin 

## 2021-08-16 NOTE — Patient Instructions (Signed)
Please increase fluid intake to at least 64 ounces in a day.  Please eat at regular intervals.  Please take Meclizine as needed for dizziness.

## 2021-08-16 NOTE — Progress Notes (Signed)
Acute Office Visit  Subjective:    Patient ID: Georgeann OppenheimClara M Urick, female    DOB: [birth year], 56 y.o.   MRN: [id number]  Chief Complaint  Patient presents with   Dizziness    Pt has been having dizziness weakness headaches and difficulty swallowing since 08-09-21    HPI Patient is in today for complaint of dizziness, nausea and weakness for the last 1 week.  Her dizziness is worse upon standing and walking.  She denies any chest pain or palpitations.  Denies any fever or chills.  Her diarrhea has resolved now.  Of note, she has history of seizure disorder, and has not been taking her Depakote regularly.  She denies any seizure-like activity recently.  She complains of dysphagia, which is worse recently.  She has trouble swallowing her saliva as well.  Denies any odynophagia currently.  Denies having any oral thrush.  She complains of dyspnea and wheezing.  She has history of asthma, for which she uses Symbicort and as needed albuterol.  She has been requiring albuterol more frequently recently.  Denies any fever or chills.  Past Medical History:  Diagnosis Date   Acute respiratory failure with hypoxia (HCC) 10/18/2014   Asthma    Cholecystitis with cholelithiasis 07/12/2014   Cholelithiasis with acute cholecystitis 07/13/2014   DDD (degenerative disc disease), lumbar    Depression    HTN (hypertension)    Hyperlipidemia    Rectal bleed 11/13/2011   Seizures (HCC)    remote   Stroke [facility]) 1990   chronic right hemiparesis    Past Surgical History:  Procedure Laterality Date   CESAREAN SECTION     CHOLECYSTECTOMY N/A 07/13/2014   Procedure: LAPAROSCOPIC CONVERTED TO OPEN  CHOLECYSTECTOMY ;  Surgeon: Darnell Levelodd Gerkin, MD;  Location[facility];  Service: General;  Laterality: N/A;   COLONOSCOPY  12/24/2011   Procedure: COLONOSCOPY;  Surgeon: West BaliSandi L Fields, MD;  Location[facility];  Service: Endoscopy;  Laterality: N/A;  2:00   [place]     [place]   PARTIAL  HYSTERECTOMY     heavy bleeding    Family History  Problem Relation Age of Onset   Stroke Father    Diabetes Other    Colon cancer Neg Hx    Liver disease Neg Hx     Social History   Socioeconomic History   Marital status: Divorced    Spouse name: Not on file   Number of children: 2   Years of education: Not on file   Highest education level: Not on file  Occupational History   Occupation: disabled    Employer: UNEMPLOYED  Tobacco Use   Smoking status: Never   Smokeless tobacco: Never  Vaping Use   Vaping Use: Never used  Substance and Sexual Activity   Alcohol use: Yes    Alcohol/week: 2.0 standard drinks    Types: 2 Standard drinks or equivalent per week    Comment: couple of drinks on weekends   Drug use: No   Sexual activity: Not Currently  Other Topics Concern   Not on file  Social History Narrative   On disability. Used to work at [employer]/Worked at [employer] Retired now   Medtr[employer]Divorced.    Two children.    Daughter: [id number]    Son: [id number]   Has seven grand children.    Enjoys reading      Diet: eats lots of fruits and veggies, occasional meat: chicken mostly, likes pasta  Caffeine: Tea-green lipton   Water: about 5 cups daily      Wears seatbelt   Smoke detectors      Social Determinants of Health   Financial Resource Strain: Low Risk    Difficulty of Paying Living Expenses: Not hard at all  Food Insecurity: No Food Insecurity   Worried About Programme researcher, broadcasting/film/video in the Last Year: Never true   Barista in the Last Year: Never true  Transportation Needs: Personal assistant (Medical): Yes   Lack of Transportation (Non-Medical): Yes  Physical Activity: Sufficiently Active   Days of Exercise per Week: 5 days   Minutes of Exercise per Session: 30 min  Stress: Stress Concern Present   Feeling of Stress : Very much  Social Connections: Moderately Integrated   Frequency of Communication with Friends and Family:  More than three times a week   Frequency of Social Gatherings with Friends and Family: More than three times a week   Attends Religious Services: 1 to 4 times per year   Active Member of [employer] or Organizations: No   Attends Engineer, structural: 1 to 4 times per year   Marital Status: Divorced  Catering manager Violence: Not At Risk   Fear of Current or Ex-Partner: No   Emotionally Abused: No   Physically Abused: No   Sexually Abused: No    Outpatient Medications Prior to Visit  Medication Sig Dispense Refill   aspirin EC 81 MG tablet Take 1 tablet (81 mg total) by mouth daily.     budesonide-formoterol (SYMBICORT) 80-4.5 MCG/ACT inhaler INHALE 2 PUFFS BY MOUTH EVERY 12 HOURS 10.2 g 2   dicyclomine (BENTYL) 10 MG capsule Take 1 capsule (10 mg total) by mouth 4 (four) times daily as needed for spasms. 30 capsule 1   divalproex (DEPAKOTE) 250 MG DR tablet TAKE 1 TABLET BY MOUTH THREE TIMES DAILY. 90 tablet 0   FLUoxetine (PROZAC) 10 MG tablet Take 1 tablet (10 mg total) by mouth daily. 30 tablet 2   levalbuterol (XOPENEX HFA) 45 MCG/ACT inhaler INHALE 1 TO 2 PUFFS BY MOUTH EVERY 4 TO 6 HOURS AS NEEDED FOR WHEEZING 15 g 5   Nebulizers (MY MDI PORTABLE NEBULISER) MISC 1 application by Does not apply route as needed. 1 each 0   ondansetron (ZOFRAN) 4 MG tablet Take 1 tablet (4 mg total) by mouth every 8 (eight) hours as needed for nausea or vomiting. 20 tablet 0   oxybutynin (DITROPAN-XL) 5 MG 24 hr tablet Take 5 mg by mouth daily.     pravastatin (PRAVACHOL) 20 MG tablet TAKE 1 TABLET(20 MG) BY MOUTH DAILY 90 tablet 2   triamcinolone (KENALOG) 0.025 % ointment Apply 1 application topically 3 (three) times daily. 90 g 0   UNABLE TO FIND 1 application by Other route daily. Wrist splint for carpal tunnel syndrome: Apply it daily. 1 each 0   verapamil (CALAN) 120 MG tablet Take 1 tablet (120 mg total) by mouth daily. 90 tablet 1   albuterol (ACCUNEB) 0.63 MG/3ML nebulizer solution Take 3  mLs (0.63 mg total) by nebulization every 6 (six) hours as needed for wheezing or shortness of breath. 3 mL 3   nitrofurantoin, macrocrystal-monohydrate, (MACROBID) 100 MG capsule Take 1 capsule (100 mg total) by mouth 2 (two) times daily. 10 capsule 0   No facility-administered medications prior to visit.    Allergies  Allergen Reactions   Bee Venom Swelling  Review of Systems  Constitutional:  Positive for fatigue. Negative for chills and fever.  HENT:  Negative for congestion, sinus pressure, sinus pain and sore throat.   Eyes:  Negative for pain and discharge.  Respiratory:  Positive for cough, shortness of breath and wheezing.   Cardiovascular:  Negative for chest pain and palpitations.  Gastrointestinal:  Negative for abdominal pain, diarrhea, nausea and vomiting.  Endocrine: Negative for polydipsia and polyuria.  Genitourinary:  Negative for dysuria and hematuria.  Musculoskeletal:  Negative for neck pain and neck stiffness.  Skin:  Negative for rash.  Neurological:  Positive for dizziness and headaches. Negative for weakness.  Psychiatric/Behavioral:  Positive for sleep disturbance. Negative for agitation and behavioral problems.       Objective:    Physical Exam Vitals reviewed.  Constitutional:      General: She is not in acute distress.    Appearance: She is obese. She is not diaphoretic.  HENT:     Head: Normocephalic and atraumatic.     Nose: Nose normal. No congestion.     Mouth/Throat:     Mouth: Mucous membranes are moist.     Pharynx: No posterior oropharyngeal erythema.  Eyes:     General: No scleral icterus.    Extraocular Movements: Extraocular movements intact.  Cardiovascular:     Rate and Rhythm: Normal rate and regular rhythm.     Pulses: Normal pulses.     Heart sounds: Normal heart sounds. No murmur heard. Pulmonary:     Breath sounds: Wheezing (Mild, diffuse) present. No rales.  Musculoskeletal:     Cervical back: Neck supple. No  tenderness.     Right lower leg: No edema.     Left lower leg: No edema.  Skin:    General: Skin is warm.     Findings: No rash.  Neurological:     General: No focal deficit present.     Mental Status: She is alert and oriented to person, place, and time.     Motor: Weakness (RUE - 4/5, RLE - 4/5) present.  Psychiatric:        Mood and Affect: Mood normal.        Behavior: Behavior normal.    Resp 18   Ht  (1.575 m)   Wt 195 lb (88.5 kg)   SpO2 97%   BMI 35.67 kg/m  Wt Readings from Last 3 Encounters:  08/16/21 195 lb (88.5 kg)  04/12/21 205 lb 0.6 oz (93 kg)  01/31/21 208 lb 6.4 oz (94.5 kg)        Assessment & Plan:   Problem List Items Addressed This Visit       Respiratory   Moderate persistent asthma with acute exacerbation    Has acute exacerbation Solu-Medrol in the office today Continue Symbicort and as needed Xopenex She has better response with albuterol nebulizer, prescription sent        Relevant Medications   albuterol (ACCUNEB) 0.63 MG/3ML nebulizer solution   Oropharyngeal dysphagia    Has recent worsening of dysphagia Referred to speech therapy for swallow eval       Relevant Orders   Ambulatory referral to Speech Therapy     Nervous and Auditory   CVA, old, hemiparesis (HCC)    With right hemiparesis On aspirin and statin       Relevant Orders   Lipid Profile     Other   Vertigo - Primary    Has positional dizziness, likely vertigo Meclizine  as needed Advised to maintain adequate hydration Advised to eat at regular intervals Orthostatics negative in the office today       Relevant Medications   meclizine (ANTIVERT) 25 MG tablet   Other Relevant Orders   Basic Metabolic Panel (BMET)   CBC with Differential/Platelet     Meds ordered this encounter  Medications   meclizine (ANTIVERT) 25 MG tablet    Sig: Take 1 tablet (25 mg total) by mouth 3 (three) times daily as needed for dizziness.    Dispense:  30 tablet     Refill:  0   albuterol (ACCUNEB) 0.63 MG/3ML nebulizer solution    Sig: Take 3 mLs (0.63 mg total) by nebulization every 6 (six) hours as needed for wheezing or shortness of breath.    Dispense:  3 mL    Refill:  3    Please dispense nebulizer machine.   methylPREDNISolone sodium succinate (SOLU-MEDROL) 125 mg/2 mL injection 125 mg     Anabel Halon, MD

## 2021-08-17 LAB — CBC WITH DIFFERENTIAL/PLATELET
Basophils Absolute: 0.1 10*3/uL (ref 0.0–0.2)
Basos: 1 %
EOS (ABSOLUTE): 0.5 10*3/uL — ABNORMAL HIGH (ref 0.0–0.4)
Eos: 9 %
Hematocrit: 41.6 % (ref 34.0–46.6)
Hemoglobin: 14.2 g/dL (ref 11.1–15.9)
Immature Grans (Abs): 0 10*3/uL (ref 0.0–0.1)
Immature Granulocytes: 0 %
Lymphocytes Absolute: 2.8 10*3/uL (ref 0.7–3.1)
Lymphs: 47 %
MCH: 31.2 pg (ref 26.6–33.0)
MCHC: 34.1 g/dL (ref 31.5–35.7)
MCV: 91 fL (ref 79–97)
Monocytes Absolute: 0.3 10*3/uL (ref 0.1–0.9)
Monocytes: 6 %
Neutrophils Absolute: 2.2 10*3/uL (ref 1.4–7.0)
Neutrophils: 37 %
Platelets: 244 10*3/uL (ref 150–450)
RBC: 4.55 x10E6/uL (ref 3.77–5.28)
RDW: 12.1 % (ref 11.7–15.4)
WBC: 5.9 10*3/uL (ref 3.4–10.8)

## 2021-08-17 LAB — BASIC METABOLIC PANEL
BUN/Creatinine Ratio: 20 (ref 9–23)
BUN: 17 mg/dL (ref 6–24)
CO2: 25 mmol/L (ref 20–29)
Calcium: 9.4 mg/dL (ref 8.7–10.2)
Chloride: 103 mmol/L (ref 96–106)
Creatinine, Ser: 0.85 mg/dL (ref 0.57–1.00)
Glucose: 89 mg/dL (ref 70–99)
Potassium: 4.7 mmol/L (ref 3.5–5.2)
Sodium: 141 mmol/L (ref 134–144)
eGFR: 80 mL/min/{1.73_m2} (ref >59)

## 2021-08-17 LAB — LIPID PANEL
Chol/HDL Ratio: 3.8 ratio (ref 0.0–4.4)
Cholesterol, Total: 192 mg/dL (ref 100–199)
HDL: 51 mg/dL (ref >39)
LDL Chol Calc (NIH): 112 mg/dL — ABNORMAL HIGH (ref 0–99)
Triglycerides: 166 mg/dL — ABNORMAL HIGH (ref 0–149)
VLDL Cholesterol Cal: 29 mg/dL (ref 5–40)

## 2021-08-20 ENCOUNTER — Telehealth: Payer: Self-pay | Admitting: Internal Medicine

## 2021-08-20 NOTE — Telephone Encounter (Signed)
Pt called stating she woke up this am with a long Setterlund on her rt breast. Wants medical advise?

## 2021-08-20 NOTE — Telephone Encounter (Signed)
Needs an appointment.

## 2021-08-20 NOTE — Telephone Encounter (Signed)
Need an appointment?

## 2021-08-22 ENCOUNTER — Other Ambulatory Visit: Payer: Self-pay | Admitting: Internal Medicine

## 2021-08-22 DIAGNOSIS — F321 Major depressive disorder, single episode, moderate: Secondary | ICD-10-CM

## 2021-08-30 ENCOUNTER — Telehealth: Payer: Self-pay

## 2021-08-30 NOTE — Telephone Encounter (Signed)
Izora Gala called from [facility] thearpy About the dx code on referral needs to be a dx for example she said: Choking when eating swallowing food, Coughing when choking  Just needs 2 dx codes.  Nancy call back # [phone]

## 2021-08-30 NOTE — Telephone Encounter (Signed)
Patty [name] notified said that code will work

## 2021-08-31 ENCOUNTER — Other Ambulatory Visit (HOSPITAL_COMMUNITY): Payer: Self-pay | Admitting: Specialist

## 2021-08-31 DIAGNOSIS — R059 Cough, unspecified: Secondary | ICD-10-CM

## 2021-08-31 DIAGNOSIS — R1312 Dysphagia, oropharyngeal phase: Secondary | ICD-10-CM

## 2021-08-31 DIAGNOSIS — R0989 Other specified symptoms and signs involving the circulatory and respiratory systems: Secondary | ICD-10-CM

## 2021-08-31 NOTE — Telephone Encounter (Signed)
Harriett Sine called from [facility]. [phone]  Needs   specific code, code given will not work. If coughing R05.1 or R05.9 OR if trouble feeding R63.39.

## 2021-08-31 NOTE — Telephone Encounter (Signed)
Patty [name] advised code was changed

## 2021-09-03 ENCOUNTER — Other Ambulatory Visit (HOSPITAL_COMMUNITY): Payer: Self-pay | Admitting: Specialist

## 2021-09-18 ENCOUNTER — Ambulatory Visit (HOSPITAL_COMMUNITY): Payer: Medicare Other | Admitting: Speech Pathology

## 2021-09-18 ENCOUNTER — Encounter (HOSPITAL_COMMUNITY): Payer: Self-pay | Admitting: Speech Pathology

## 2021-09-18 ENCOUNTER — Ambulatory Visit (HOSPITAL_COMMUNITY)
Admission: RE | Admit: 2021-09-18 | Discharge: 2021-09-18 | Disposition: A | Discharge status: Home / Self Care | Payer: Medicare Other | Source: Ambulatory Visit | Attending: Internal Medicine | Admitting: Internal Medicine

## 2021-09-18 DIAGNOSIS — R059 Cough, unspecified: Secondary | ICD-10-CM | POA: Insufficient documentation

## 2021-09-18 DIAGNOSIS — R1312 Dysphagia, oropharyngeal phase: Secondary | ICD-10-CM | POA: Diagnosis not present

## 2021-09-18 DIAGNOSIS — R0989 Other specified symptoms and signs involving the circulatory and respiratory systems: Secondary | ICD-10-CM | POA: Insufficient documentation

## 2021-09-18 DIAGNOSIS — R1310 Dysphagia, unspecified: Secondary | ICD-10-CM | POA: Diagnosis not present

## 2021-09-18 NOTE — Therapy (Signed)
[facility] [address] [place], [place], [address] Phone: [phone]   Fax:  [phone]  Modified Barium Swallow  Patient Details  Name: Patty [name] MRN: [id number] Date of Birth: [birth year] No data recorded  Encounter Date: 09/18/2021   End of Session - 09/18/21 1613     Visit Number 1    Number of Visits 1    Authorization Type Medicare    SLP Start Time 1350    SLP Stop Time  1420    SLP Time Calculation (min) 30 min    Activity Tolerance Patient tolerated treatment well             Past Medical History:  Diagnosis Date   Acute respiratory failure with hypoxia (HCC) 10/18/2014   Asthma    Cholecystitis with cholelithiasis 07/12/2014   Cholelithiasis with acute cholecystitis 07/13/2014   DDD (degenerative disc disease), lumbar    Depression    HTN (hypertension)    Hyperlipidemia    Rectal bleed 11/13/2011   Seizures (HCC)    remote   Stroke [facility]) 1990   chronic right hemiparesis    Past Surgical History:  Procedure Laterality Date   CESAREAN SECTION     CHOLECYSTECTOMY N/A 07/13/2014   Procedure: LAPAROSCOPIC CONVERTED TO OPEN  CHOLECYSTECTOMY ;  Surgeon: Darnell Level, MD;  Location: [facility];  Service: General;  Laterality: N/A;   COLONOSCOPY  12/24/2011   Procedure: COLONOSCOPY;  Surgeon: West Bali, MD;  Location: [facility];  Service: Endoscopy;  Laterality: N/A;  2:00   GALLBLADDER SURGERY     [name] /[place]   PARTIAL HYSTERECTOMY     heavy bleeding    There were no vitals filed for this visit.   Subjective Assessment - 09/18/21 1555     Subjective "I have to bite my tongue to swallow my saliva."    Special Tests MBSS    Currently in Pain? No/denies                 General - 09/18/21 1559       General Information   Date of Onset 08/16/21    HPI Patty [name] is a 56 yo female who was referred by Dr. Patterson Hammersmith for MBSS due to Pt reports of difficulty swallowing her saliva.  Pt tells SLP that she has to "bite her tongue" in order to swallow her saliva.    Type of Study MBS-Modified Barium Swallow Study    Diet Prior to this Study Regular;Thin liquids    Temperature Spikes Noted No    Respiratory Status Room air    History of Recent Intubation No    Behavior/Cognition Alert;Cooperative;Pleasant mood    Oral Cavity Assessment Within Functional Limits    Oral Care Completed by SLP No    Oral Cavity - Dentition Adequate natural dentition;Dentures, top    Vision Functional for self feeding    Self-Feeding Abilities Able to feed self    Patient Positioning Upright in chair    Baseline Vocal Quality Normal    Volitional Cough Strong    Volitional Swallow Able to elicit    Anatomy Within functional limits    Pharyngeal Secretions Not observed secondary MBS                Oral Preparation/Oral Phase - 09/18/21 1606       Oral Preparation/Oral Phase   Oral Phase Within functional limits      Electrical stimulation -  Oral Phase   Was Electrical Stimulation Used No              Pharyngeal Phase - 09/18/21 1606       Pharyngeal Phase   Pharyngeal Phase Impaired      Pharyngeal - Thin   Pharyngeal- Thin Teaspoon Within functional limits    Pharyngeal- Thin Cup Within functional limits    Pharyngeal- Thin Straw Within functional limits;Pharyngeal residue - valleculae;Lateral channel residue   post swallow min pooling in the valleculae and lateral channels after sequential swallows, clears with dry swallow     Pharyngeal - Solids   Pharyngeal- Puree Swallow initiation at vallecula;Reduced tongue base retraction;Pharyngeal residue - valleculae   min vallecular residue clears with spontaneous second swallow   Pharyngeal- Regular Within functional limits    Pharyngeal- Pill Penetration/Aspiration during swallow   penetrated thins via cup when taking with barium tablet   Pharyngeal Material enters airway, remains ABOVE vocal cords then ejected out       Electrical Stimulation - Pharyngeal Phase   Was Electrical Stimulation Used No              Cricopharyngeal Phase - 09/18/21 1612       Cervical Esophageal Phase   Cervical Esophageal Phase Within functional limits                      Plan - 09/18/21 1614     Clinical Impression Statement Pt presents with normal oropharyngeal swallow with timely swallow trigger and min reduced tongue base retraction resulting in trace/min vallecular residue with puree, which Pt spontaneously clears with repeat/dry swallow. Pt with a single episode of flash penetration with cup sips of thin when taking the barium tablet, however it was immediately removed from laryngeal vestibule. The barium tablet was transiently delayed in the valleculae for a couple of seconds before passing through to the esophagus. Pt asked to take sequential straw sips wich resulted in trace/min vallecular and lateral channel residue, but again cleared with a dry swallow. Recommend regular textures and thin liquids via cup or straw and po medications whole with water. No specific swallow strategies indicated at this time, however Pt was encouraged to push her tongue to the roof of her mouth instead of "biting her tongue" when trying to swallow her saliva. She did not exhibit difficulty swallowing her saliva today. Pt reports previous stroke ~30 years ago and h/o seizures, but does not recall when the last one she had was.    Potential to Achieve Goals Good    Consulted and Agree with Plan of Care Patient             Patient will benefit from skilled therapeutic intervention in order to improve the following deficits and impairments:   Oropharyngeal dysphagia     Recommendations/Treatment - 09/18/21 1612       Swallow Evaluation Recommendations   SLP Diet Recommendations Age appropriate regular;Thin    Liquid Administration via Cup;Straw    Medication Administration Whole meds with liquid    Supervision  Patient able to self feed    Postural Changes Seated upright at 90 degrees;Remain upright for at least 30 minutes after feeds/meals              Prognosis - 09/18/21 1613       Prognosis   Prognosis for Safe Diet Advancement Good      Individuals Consulted   Consulted and Agree with Results and Recommendations Patient  Report Sent to  Referring physician             Problem List Patient Active Problem List   Diagnosis Date Noted   Oropharyngeal dysphagia 08/16/2021   Vertigo 08/16/2021   CVA, old, hemiparesis (HCC) 04/12/2021   Depression, major, single episode, moderate (HCC) 04/12/2021   Prediabetes 04/12/2021   Obstructive sleep apnea syndrome 04/12/2021   Fatigue 09/20/2020   Chest pain 09/20/2020   Peripheral neuropathy 04/10/2020   Moderate persistent asthma with acute exacerbation 08/31/2019   Mixed hyperlipidemia 12/09/2018   Morbid obesity due to excess calories (HCC) 12/09/2018   High risk medication use 04/25/2017   Essential hypertension 10/18/2014   Seizure disorder (HCC) 10/18/2014   Stroke [facility]) 1990   Thank you,  Havery Moros, CCC-SLP [phone]  Havery Moros CCC-SLP 09/18/2021, 4:22 PM  [facility] [address] [place], [place], [address] Phone: [phone]   Fax:  [phone]  Name: Patty [name] MRN: [id number] Date of Birth: Oct 11, 1965

## 2021-09-26 ENCOUNTER — Other Ambulatory Visit: Payer: Self-pay | Admitting: Neurology

## 2021-09-26 NOTE — Telephone Encounter (Signed)
I called pt and relayed that we have not seen her since 12-2018.  OV note states that she could get from pcp.  I asked who had been prescribing to her, she said her pcp did for her once??  I told her that we are happy to see (prescribe for her) needs appt.  I would call her pcp to get recent refill and if need [place] to renew future refills to call back and make appt.

## 2021-10-03 ENCOUNTER — Ambulatory Visit: Payer: Medicare Other | Admitting: Internal Medicine

## 2021-10-08 ENCOUNTER — Ambulatory Visit (INDEPENDENT_AMBULATORY_CARE_PROVIDER_SITE_OTHER): Payer: Medicare Other | Admitting: Orthopedic Surgery

## 2021-10-08 DIAGNOSIS — M25461 Effusion, right knee: Secondary | ICD-10-CM | POA: Diagnosis not present

## 2021-10-08 MED ORDER — PREDNISONE 10 MG (48) PO TBPK: ORAL_TABLET | Freq: Every day | ORAL | 0 refills | Status: DC

## 2021-10-08 NOTE — Progress Notes (Signed)
Chief Complaint  Patient presents with   Knee Pain    R/ having pain radiating up toward hip. It swells mostly at night or when I do a lot of walking.    56 year old female history of MRI showing a torn medial meniscus back in 2020 never had surgery comes in with frequent swelling of the right knee and then lower back pain  Swelling seems to go down by the next morning  MRI report from 2020 IMPRESSION: 1. Small horizontal grade 3 tear of the posterior horn lateral meniscus near the midbody. Mild lateral meniscal extrusion. 2. Mild to moderate degrees of degenerative chondral thinning in the knee joint. 3. Small knee effusion and small Baker's cyst.     Electronically Signed   By: Gaylyn Rong M.D.   On: 01/22/2019 08:18  Today the knee looks like it has an effusion I recommended a tap it was a dry tap.  She had full range of motion with no pain but had tenderness on the lateral joint line  Procedure note injection and aspiration right knee joint  Verbal consent was obtained to aspirate and inject the right knee joint   Timeout was completed to confirm the site of aspiration and injection  An 18-gauge needle was used to aspirate the knee joint from a suprapatellar lateral approach.  The medications used were 40 mg of Depo-Medrol and 1% lidocaine 3 cc  Anesthesia was provided by ethyl chloride and the skin was prepped with alcohol.  After cleaning the skin with alcohol an 18-gauge needle was used to aspirate the right knee joint.  We obtained no fluid  We follow this by injection of 40 mg of Depo-Medrol and 3 cc 1% lidocaine.  There were no complications. A sterile bandage was applied.  Recommend Dosepak  Follow-up in 2 weeks if no improvement consider surgical removal of the torn meniscus  Meds ordered this encounter  Medications   predniSONE (STERAPRED UNI-PAK 48 TAB) 10 MG (48) TBPK tablet    Sig: Take by mouth daily. 12 days ds as directed    Dispense:  48  tablet    Refill:  0

## 2021-10-11 ENCOUNTER — Ambulatory Visit: Payer: Medicare Other | Admitting: Internal Medicine

## 2021-10-12 ENCOUNTER — Encounter: Payer: Self-pay | Admitting: Internal Medicine

## 2021-10-12 ENCOUNTER — Ambulatory Visit: Payer: Medicare Other | Admitting: Orthopedic Surgery

## 2021-10-15 ENCOUNTER — Encounter: Payer: Self-pay | Admitting: Internal Medicine

## 2021-10-15 ENCOUNTER — Ambulatory Visit (INDEPENDENT_AMBULATORY_CARE_PROVIDER_SITE_OTHER): Payer: Medicare Other | Admitting: Internal Medicine

## 2021-10-15 VITALS — BP 124/84 | HR 77 | Ht 62.0 in | Wt 195.4 lb

## 2021-10-15 DIAGNOSIS — B86 Scabies: Secondary | ICD-10-CM

## 2021-10-15 MED ORDER — PERMETHRIN 5 % EX CREA
1.0000 | TOPICAL_CREAM | Freq: Once | CUTANEOUS | 0 refills | Status: AC
Start: 2021-10-15 — End: 2021-10-15

## 2021-10-15 NOTE — Patient Instructions (Addendum)
Massage Permethrin 5% topical cream into the skin from the head to the soles of the feet. Usually 30 grams is sufficient for the average adult. Wash off after 8 to 14 hours. One application is generally curative. Retreatment is indicated if living mites persist after 7 to 14 days of initial treatment.

## 2021-10-15 NOTE — Progress Notes (Signed)
Acute Office Visit  Subjective:    Patient ID: Patty [name], female    DOB: April 05, 1965, 56 y.o.   MRN: [id number]  Chief Complaint  Patient presents with   Rash    All over body. Started 2 wks ago    HPI Patient is in today for c/o rash over her arms, upper chest wall and legs X 2 weeks.  She has intense itching.  She has discrete bumps over b/l arms and legs.  Denies any new medicine except prednisone for her joint pain.  Denies any recent tick or insect bite.  Denies any fever, chills or known injury.  Past Medical History:  Diagnosis Date   Acute respiratory failure with hypoxia (HCC) 10/18/2014   Asthma    Cholecystitis with cholelithiasis 07/12/2014   Cholelithiasis with acute cholecystitis 07/13/2014   DDD (degenerative disc disease), lumbar    Depression    HTN (hypertension)    Hyperlipidemia    Rectal bleed 11/13/2011   Seizures (HCC)    remote   Stroke [facility]) 1990   chronic right hemiparesis    Past Surgical History:  Procedure Laterality Date   CESAREAN SECTION     CHOLECYSTECTOMY N/A 07/13/2014   Procedure: LAPAROSCOPIC CONVERTED TO OPEN  CHOLECYSTECTOMY ;  Surgeon: Darnell Level, MD;  Location: [facility];  Service: General;  Laterality: N/A;   COLONOSCOPY  12/24/2011   Procedure: COLONOSCOPY;  Surgeon: West Bali, MD;  [facility];  Service: Endoscopy;  Laterality: N/A;  2:00   GALLBLADDER SURGERY     [name] /[place]   PARTIAL HYSTERECTOMY     heavy bleeding    Family History  Problem Relation Age of Onset   Stroke Father    Diabetes Other    Colon cancer Neg Hx    Liver disease Neg Hx     Social History   Socioeconomic History   Marital status: Divorced    Spouse name: Not on file   Number of children: 2   Years of education: Not on file   Highest education level: Not on file  Occupational History   Occupation: disabled    Employer: UNEMPLOYED  Tobacco Use   Smoking status: Never   Smokeless tobacco: Never  Vaping Use    Vaping Use: Never used  Substance and Sexual Activity   Alcohol use: Yes    Alcohol/week: 2.0 standard drinks of alcohol    Types: 2 Standard drinks or equivalent per week    Comment: couple of drinks on weekends   Drug use: No   Sexual activity: Not Currently  Other Topics Concern   Not on file  Social History Narrative   On disability. Used to work at [employer]/Worked at [employer] Retired now   [employer].    Two children.    Daughter: 61    Son: 48   Has seven grand children.    Enjoys reading      Diet: eats lots of fruits and veggies, occasional meat: chicken mostly, likes pasta    Caffeine: Tea-green lipton   Water: about 5 cups daily      Wears seatbelt   Smoke detectors      Social Determinants of Health   Financial Resource Strain: Low Risk  (05/30/2021)   Overall Financial Resource Strain (CARDIA)    Difficulty of Paying Living Expenses: Not hard at all  Food Insecurity: No Food Insecurity (05/30/2021)   Hunger Vital Sign    Worried About Running  Out of Food in the Last Year: Never true    Ran Out of Food in the Last Year: Never true  Transportation Needs: Unmet Transportation Needs (05/30/2021)   PRAPARE - Transportation    Lack of Transportation (Medical): Yes    Lack of Transportation (Non-Medical): Yes  Physical Activity: Sufficiently Active (12/25/2020)   Exercise Vital Sign    Days of Exercise per Week: 5 days    Minutes of Exercise per Session: 30 min  Stress: Stress Concern Present (05/30/2021)   [employer] of Occupational Health - Occupational Stress Questionnaire    Feeling of Stress : Very much  Social Connections: Moderately Integrated (12/25/2020)   Social Connection and Isolation Panel [NHANES]    Frequency of Communication with Friends and Family: More than three times a week    Frequency of Social Gatherings with Friends and Family: More than three times a week    Attends Religious Services: 1 to 4 times per year    Active Member of  [employer] or Organizations: No    Attends Banker Meetings: 1 to 4 times per year    Marital Status: Divorced  Catering manager Violence: Not At Risk (12/25/2020)   Humiliation, Afraid, Rape, and Kick questionnaire    Fear of Current or Ex-Partner: No    Emotionally Abused: No    Physically Abused: No    Sexually Abused: No    Outpatient Medications Prior to Visit  Medication Sig Dispense Refill   albuterol (ACCUNEB) 0.63 MG/3ML nebulizer solution Take 3 mLs (0.63 mg total) by nebulization every 6 (six) hours as needed for wheezing or shortness of breath. 3 mL 3   aspirin EC 81 MG tablet Take 1 tablet (81 mg total) by mouth daily.     budesonide-formoterol (SYMBICORT) 80-4.5 MCG/ACT inhaler INHALE 2 PUFFS BY MOUTH EVERY 12 HOURS 10.2 g 2   dicyclomine (BENTYL) 10 MG capsule Take 1 capsule (10 mg total) by mouth 4 (four) times daily as needed for spasms. 30 capsule 1   divalproex (DEPAKOTE) 250 MG DR tablet TAKE 1 TABLET BY MOUTH THREE TIMES DAILY. 90 tablet 0   FLUoxetine (PROZAC) 10 MG tablet TAKE 1 TABLET(10 MG) BY MOUTH DAILY 30 tablet 2   levalbuterol (XOPENEX HFA) 45 MCG/ACT inhaler INHALE 1 TO 2 PUFFS BY MOUTH EVERY 4 TO 6 HOURS AS NEEDED FOR WHEEZING 15 g 5   meclizine (ANTIVERT) 25 MG tablet Take 1 tablet (25 mg total) by mouth 3 (three) times daily as needed for dizziness. 30 tablet 0   Nebulizers (MY MDI PORTABLE NEBULISER) MISC 1 application by Does not apply route as needed. 1 each 0   ondansetron (ZOFRAN) 4 MG tablet Take 1 tablet (4 mg total) by mouth every 8 (eight) hours as needed for nausea or vomiting. 20 tablet 0   oxybutynin (DITROPAN-XL) 5 MG 24 hr tablet Take 5 mg by mouth daily.     pravastatin (PRAVACHOL) 20 MG tablet TAKE 1 TABLET(20 MG) BY MOUTH DAILY 90 tablet 2   predniSONE (STERAPRED UNI-PAK 48 TAB) 10 MG (48) TBPK tablet Take by mouth daily. 12 days ds as directed 48 tablet 0   triamcinolone (KENALOG) 0.025 % ointment Apply 1 application topically 3  (three) times daily. 90 g 0   UNABLE TO FIND 1 application by Other route daily. Wrist splint for carpal tunnel syndrome: Apply it daily. 1 each 0   verapamil (CALAN) 120 MG tablet Take 1 tablet (120 mg total) by mouth daily. 90  tablet 1   No facility-administered medications prior to visit.    Allergies  Allergen Reactions   Bee Venom Swelling    Review of Systems  Constitutional:  Positive for fatigue. Negative for chills and fever.  HENT:  Negative for congestion, sinus pressure, sinus pain and sore throat.   Eyes:  Negative for pain and discharge.  Respiratory:  Positive for cough and shortness of breath.   Cardiovascular:  Negative for chest pain and palpitations.  Gastrointestinal:  Negative for abdominal pain, diarrhea, nausea and vomiting.  Endocrine: Negative for polydipsia and polyuria.  Genitourinary:  Negative for dysuria and hematuria.  Musculoskeletal:  Negative for neck pain and neck stiffness.  Skin:  Positive for rash.  Neurological:  Positive for dizziness and headaches. Negative for weakness.  Psychiatric/Behavioral:  Positive for sleep disturbance. Negative for agitation and behavioral problems.        Objective:    Physical Exam Vitals reviewed.  Constitutional:      General: She is not in acute distress.    Appearance: She is obese. She is not diaphoretic.  HENT:     Head: Normocephalic and atraumatic.     Nose: Nose normal. No congestion.     Mouth/Throat:     Mouth: Mucous membranes are moist.     Pharynx: No posterior oropharyngeal erythema.  Eyes:     General: No scleral icterus.    Extraocular Movements: Extraocular movements intact.  Cardiovascular:     Rate and Rhythm: Normal rate and regular rhythm.     Pulses: Normal pulses.     Heart sounds: Normal heart sounds. No murmur heard. Pulmonary:     Breath sounds: No wheezing or rales.  Musculoskeletal:     Cervical back: Neck supple. No tenderness.     Right lower leg: No edema.     Left  lower leg: No edema.  Skin:    General: Skin is warm.     Findings: Rash (Maculopapular rash over erythematous base) present.  Neurological:     General: No focal deficit present.     Mental Status: She is alert and oriented to person, place, and time.     Motor: Weakness (RUE - 4/5, RLE - 4/5) present.  Psychiatric:        Mood and Affect: Mood normal.        Behavior: Behavior normal.     BP 124/84   Pulse 77   Ht 5\' 2"  (1.575 m)   Wt 195 lb 6.4 oz (88.6 kg)   SpO2 96%   BMI 35.74 kg/m  Wt Readings from Last 3 Encounters:  10/15/21 195 lb 6.4 oz (88.6 kg)  08/16/21 195 lb (88.5 kg)  04/12/21 205 lb 0.6 oz (93 kg)        Assessment & Plan:   Problem List Items Addressed This Visit    Visit Diagnoses     Scabies    -  Primary Her itching and rash likely due to scabies Permethrin cream prescribed Advised to clean linen at home Benadryl as needed for itching She is already on prednisone for joint pain, which showed also help with allergic/irritant reaction   Relevant Medications   permethrin (ELIMITE) 5 % cream        Meds ordered this encounter  Medications   permethrin (ELIMITE) 5 % cream    Sig: Apply 1 Application topically once for 1 dose. Please apply from neck down to toes.    Dispense:  1 g  Refill:  0     Toure Edmonds Keith Rake, MD

## 2021-10-23 ENCOUNTER — Ambulatory Visit (INDEPENDENT_AMBULATORY_CARE_PROVIDER_SITE_OTHER): Payer: Medicare Other | Admitting: Internal Medicine

## 2021-10-23 ENCOUNTER — Encounter: Payer: Self-pay | Admitting: Internal Medicine

## 2021-10-23 VITALS — BP 118/86 | HR 85 | Resp 18 | Ht 62.0 in | Wt 201.2 lb

## 2021-10-23 DIAGNOSIS — I10 Essential (primary) hypertension: Secondary | ICD-10-CM

## 2021-10-23 DIAGNOSIS — E782 Mixed hyperlipidemia: Secondary | ICD-10-CM | POA: Diagnosis not present

## 2021-10-23 DIAGNOSIS — F339 Major depressive disorder, recurrent, unspecified: Secondary | ICD-10-CM

## 2021-10-23 DIAGNOSIS — I69359 Hemiplegia and hemiparesis following cerebral infarction affecting unspecified side: Secondary | ICD-10-CM | POA: Diagnosis not present

## 2021-10-23 DIAGNOSIS — B86 Scabies: Secondary | ICD-10-CM

## 2021-10-23 DIAGNOSIS — G8191 Hemiplegia, unspecified affecting right dominant side: Secondary | ICD-10-CM

## 2021-10-23 MED ORDER — PERMETHRIN 5 % EX CREA: TOPICAL_CREAM | CUTANEOUS | 0 refills | Status: DC

## 2021-10-23 MED ORDER — IVERMECTIN 3 MG PO TABS: 200.0000 ug/kg | ORAL_TABLET | Freq: Once | ORAL | 0 refills | Status: AC

## 2021-10-23 NOTE — Assessment & Plan Note (Signed)
Diet modification and moderate exercise/walking advised 

## 2021-10-23 NOTE — Patient Instructions (Addendum)
Please take Ivermectin once today.  Please apply half of Permethrin cream today. Massage Permethrin 5% topical cream into the skin from the head to the soles of the feet. Usually 30 grams is sufficient for the average adult. Wash off after 8 to 14 hours. One application is generally curative. Retreatment is indicated if living mites persist after 7 to 14 days of initial treatment.  Please continue taking other medications as prescribed.  Please continue to follow low salt diet and ambulate as tolerated.

## 2021-10-23 NOTE — Assessment & Plan Note (Signed)
With right hemiparesis On aspirin and statin 

## 2021-10-23 NOTE — Assessment & Plan Note (Signed)
Was well controlled with Lexapro in the past Had titrated Lexapro and switched to Prozac, well-controlled now

## 2021-10-23 NOTE — Assessment & Plan Note (Signed)
BP Readings from Last 1 Encounters:  10/23/21 118/86   Well-controlled with Verapamil Counseled for compliance with the medications Advised DASH diet and moderate exercise/walking, at least 150 mins/week

## 2021-10-23 NOTE — Progress Notes (Signed)
Established Patient Office Visit  Subjective:  Patient ID: Patty [name], female    DOB: [birth year]  Age: 56 y.o. MRN: [id number]  CC:  Chief Complaint  Patient presents with   Follow-up    6 month follow up HTN and CVA patient is still itching from 10-15-21    HPI Patty [name] is a 56 y.o. female with past medical history of HTN, CVA, OSA, asthma, seizure disorder, HLD, depression and morbid obesity who presents for f/u of her chronic medical conditions.  She still complains of generalized itching over her arms and legs.  She was recently treated with permethrin cream for scabies, which had improved her itching for 2 days.  HTN: BP is well-controlled. Takes medications regularly. Patient denies headache, dizziness, chest pain, dyspnea or palpitations.   She has history of CVA and seizure disorder, followed by neurology.  She takes aspirin and statin for history of CVA.  She takes Depakote for seizures.  Denies any recent seizure-like activity.  She takes Prozac for h/o depression.  She denies any anhedonia, SI or HI.    Past Medical History:  Diagnosis Date   Acute respiratory failure with hypoxia ([place]) 10/18/2014   Asthma    Cholecystitis with cholelithiasis 07/12/2014   Cholelithiasis with acute cholecystitis 07/13/2014   DDD (degenerative disc disease), lumbar    Depression    HTN (hypertension)    Hyperlipidemia    Rectal bleed 11/13/2011   Seizures ([place])    remote   Stroke [facility]) 1990   chronic right hemiparesis    Past Surgical History:  Procedure Laterality Date   CESAREAN SECTION     CHOLECYSTECTOMY N/A 07/13/2014   Procedure: LAPAROSCOPIC CONVERTED TO OPEN  CHOLECYSTECTOMY ;  Surgeon: Armandina Gemma, MD;  Location: [facility];  Service: General;  Laterality: N/A;   COLONOSCOPY  12/24/2011   Procedure: COLONOSCOPY;  Surgeon: Danie Binder, MD;  Location[facility];  Service: Endoscopy;  Laterality: N/A;  2:00   GALLBLADDER SURGERY     [name]    PARTIAL HYSTERECTOMY     heavy bleeding    Family History  Problem Relation Age of Onset   Stroke Father    Diabetes Other    Colon cancer Neg Hx    Liver disease Neg Hx     Social History   Socioeconomic History   Marital status: Divorced    Spouse name: Not on file   Number of children: 2   Years of education: Not on file   Highest education level: Not on file  Occupational History   Occupation: disabled    Employer: UNEMPLOYED  Tobacco Use   Smoking status: Never   Smokeless tobacco: Never  Vaping Use   Vaping Use: Never used  Substance and Sexual Activity   Alcohol use: Yes    Alcohol/week: 2.0 standard drinks of alcohol    Types: 2 Standard drinks or equivalent per week    Comment: couple of drinks on weekends   Drug use: No   Sexual activity: Not Currently  Other Topics Concern   Not on file  Social History Narrative   On disability. Used to work at [employer]/Worked at [employer] Retired now   [employer]    Two children.    Daughter: 101    Son: 45   Has seven grand children.    Enjoys reading      Diet: eats lots of fruits and veggies, occasional meat: chicken mostly, likes pasta  Caffeine: Tea-green lipton   Water: about 5 cups daily      Wears seatbelt   Smoke detectors      Social Determinants of Health   Financial Resource Strain: Low Risk  (05/30/2021)   Overall Financial Resource Strain (CARDIA)    Difficulty of Paying Living Expenses: Not hard at all  Food Insecurity: No Food Insecurity (05/30/2021)   Hunger Vital Sign    Worried About Running Out of Food in the Last Year: Never true    [place] in the Last Year: Never true  Transportation Needs: Unmet Transportation Needs (05/30/2021)   PRAPARE - Hydrologist (Medical): Yes    Lack of Transportation (Non-Medical): Yes  Physical Activity: Sufficiently Active (12/25/2020)   Exercise Vital Sign    Days of Exercise per Week: 5 days    Minutes of  Exercise per Session: 30 min  Stress: Stress Concern Present (05/30/2021)   [place]    Feeling of Stress : Very much  Social Connections: Moderately Integrated (12/25/2020)   Social Connection and Isolation Panel [NHANES]    Frequency of Communication with Friends and Family: More than three times a week    Frequency of Social Gatherings with Friends and Family: More than three times a week    Attends Religious Services: 1 to 4 times per year    Active Member of [employer] or Organizations: No    Attends Archivist Meetings: 1 to 4 times per year    Marital Status: Divorced  Human resources officer Violence: Not At Risk (12/25/2020)   Humiliation, Afraid, Rape, and Kick questionnaire    Fear of Current or Ex-Partner: No    Emotionally Abused: No    Physically Abused: No    Sexually Abused: No    Outpatient Medications Prior to Visit  Medication Sig Dispense Refill   albuterol (ACCUNEB) 0.63 MG/3ML nebulizer solution Take 3 mLs (0.63 mg total) by nebulization every 6 (six) hours as needed for wheezing or shortness of breath. 3 mL 3   aspirin EC 81 MG tablet Take 1 tablet (81 mg total) by mouth daily.     budesonide-formoterol (SYMBICORT) 80-4.5 MCG/ACT inhaler INHALE 2 PUFFS BY MOUTH EVERY 12 HOURS 10.2 g 2   dicyclomine (BENTYL) 10 MG capsule Take 1 capsule (10 mg total) by mouth 4 (four) times daily as needed for spasms. 30 capsule 1   divalproex (DEPAKOTE) 250 MG DR tablet TAKE 1 TABLET BY MOUTH THREE TIMES DAILY. 90 tablet 0   FLUoxetine (PROZAC) 10 MG tablet TAKE 1 TABLET(10 MG) BY MOUTH DAILY 30 tablet 2   levalbuterol (XOPENEX HFA) 45 MCG/ACT inhaler INHALE 1 TO 2 PUFFS BY MOUTH EVERY 4 TO 6 HOURS AS NEEDED FOR WHEEZING 15 g 5   meclizine (ANTIVERT) 25 MG tablet Take 1 tablet (25 mg total) by mouth 3 (three) times daily as needed for dizziness. 30 tablet 0   Nebulizers (MY MDI PORTABLE NEBULISER) MISC 1 application by  Does not apply route as needed. 1 each 0   ondansetron (ZOFRAN) 4 MG tablet Take 1 tablet (4 mg total) by mouth every 8 (eight) hours as needed for nausea or vomiting. 20 tablet 0   oxybutynin (DITROPAN-XL) 5 MG 24 hr tablet Take 5 mg by mouth daily.     pravastatin (PRAVACHOL) 20 MG tablet TAKE 1 TABLET(20 MG) BY MOUTH DAILY 90 tablet 2   predniSONE (STERAPRED UNI-PAK 48  TAB) 10 MG (48) TBPK tablet Take by mouth daily. 12 days ds as directed 48 tablet 0   triamcinolone (KENALOG) 0.025 % ointment Apply 1 application topically 3 (three) times daily. 90 g 0   UNABLE TO FIND 1 application by Other route daily. Wrist splint for carpal tunnel syndrome: Apply it daily. 1 each 0   verapamil (CALAN) 120 MG tablet Take 1 tablet (120 mg total) by mouth daily. 90 tablet 1   No facility-administered medications prior to visit.    Allergies  Allergen Reactions   Bee Venom Swelling    ROS Review of Systems  Constitutional:  Positive for fatigue. Negative for chills and fever.  HENT:  Negative for congestion, sinus pressure, sinus pain and sore throat.   Eyes:  Negative for pain and discharge.  Respiratory:  Negative for cough and shortness of breath.   Cardiovascular:  Negative for chest pain and palpitations.  Gastrointestinal:  Negative for abdominal pain, diarrhea, nausea and vomiting.  Endocrine: Negative for polydipsia and polyuria.  Genitourinary:  Negative for dysuria and hematuria.  Musculoskeletal:  Negative for neck pain and neck stiffness.  Skin:  Positive for rash.  Neurological:  Negative for dizziness and weakness.  Psychiatric/Behavioral:  Positive for sleep disturbance. Negative for agitation and behavioral problems.       Objective:    Physical Exam Vitals reviewed.  Constitutional:      General: She is not in acute distress.    Appearance: She is obese. She is not diaphoretic.  HENT:     Head: Normocephalic and atraumatic.     Nose: Nose normal. No congestion.      Mouth/Throat:     Mouth: Mucous membranes are moist.     Pharynx: No posterior oropharyngeal erythema.  Eyes:     General: No scleral icterus.    Extraocular Movements: Extraocular movements intact.  Cardiovascular:     Rate and Rhythm: Normal rate and regular rhythm.     Pulses: Normal pulses.     Heart sounds: Normal heart sounds. No murmur heard. Pulmonary:     Breath sounds: No wheezing or rales.  Musculoskeletal:     Cervical back: Neck supple. No tenderness.     Right lower leg: No edema.     Left lower leg: No edema.  Skin:    General: Skin is warm.     Findings: Rash (Maculopapular rash over erythematous base) present.  Neurological:     General: No focal deficit present.     Mental Status: She is alert and oriented to person, place, and time.     Motor: Weakness (RUE - 4/5, RLE - 4/5) present.  Psychiatric:        Mood and Affect: Mood normal.        Behavior: Behavior normal.     BP 118/86 (BP Location: Right Arm, Patient Position: Sitting, Cuff Size: Normal)   Pulse 85   Resp 18   Ht '5\' 2"'  (1.575 m)   Wt 201 lb 3.2 oz (91.3 kg)   SpO2 97%   BMI 36.80 kg/m  Wt Readings from Last 3 Encounters:  10/23/21 201 lb 3.2 oz (91.3 kg)  10/15/21 195 lb 6.4 oz (88.6 kg)  08/16/21 195 lb (88.5 kg)    Lab Results  Component Value Date   TSH 1.840 09/21/2020   Lab Results  Component Value Date   WBC 5.9 08/16/2021   HGB 14.2 08/16/2021   HCT 41.6 08/16/2021   MCV 91 08/16/2021  PLT 244 08/16/2021   Lab Results  Component Value Date   NA 141 08/16/2021   K 4.7 08/16/2021   CO2 25 08/16/2021   GLUCOSE 89 08/16/2021   BUN 17 08/16/2021   CREATININE 0.85 08/16/2021   BILITOT <0.2 09/21/2020   ALKPHOS 97 09/21/2020   AST 13 09/21/2020   ALT 18 09/21/2020   PROT 6.7 09/21/2020   ALBUMIN 4.1 09/21/2020   CALCIUM 9.4 08/16/2021   ANIONGAP 7 03/07/2016   EGFR 80 08/16/2021   Lab Results  Component Value Date   CHOL 192 08/16/2021   Lab Results   Component Value Date   HDL 51 08/16/2021   Lab Results  Component Value Date   LDLCALC 112 (H) 08/16/2021   Lab Results  Component Value Date   TRIG 166 (H) 08/16/2021   Lab Results  Component Value Date   CHOLHDL 3.8 08/16/2021   Lab Results  Component Value Date   HGBA1C 5.9 (H) 01/02/2021      Assessment & Plan:   Problem List Items Addressed This Visit       Cardiovascular and Mediastinum   Essential hypertension - Primary    BP Readings from Last 1 Encounters:  10/23/21 118/86  Well-controlled with Verapamil Counseled for compliance with the medications Advised DASH diet and moderate exercise/walking, at least 150 mins/week        Nervous and Auditory   CVA, old, hemiparesis ([place])    With right hemiparesis On aspirin and statin      Right hemiparesis ([place])    With right hemiparesis On aspirin and statin        Musculoskeletal and Integument   Scabies    Did did not improve with permethrin cream alone Added oral ivermectin for resistant scabies      Relevant Medications   permethrin (ELIMITE) 5 % cream   ivermectin (STROMECTOL) 3 MG TABS tablet     Other   Mixed hyperlipidemia    On statin      Morbid obesity due to excess calories ([facility])    Diet modification and moderate exercise/walking advised      Depression, recurrent ([place])    Was well controlled with Lexapro in the past Had titrated Lexapro and switched to Prozac, well-controlled now       Meds ordered this encounter  Medications   permethrin (ELIMITE) 5 % cream    Sig: Apply 1 Application topically once for 1 dose. Please apply from neck down to toes.    Dispense:  60 g    Refill:  0   ivermectin (STROMECTOL) 3 MG TABS tablet    Sig: Take 6 tablets (18,000 mcg total) by mouth once for 1 dose.    Dispense:  6 tablet    Refill:  0    Follow-up: Return in about 6 months (around 04/25/2022).    Lindell Spar, MD

## 2021-10-23 NOTE — Assessment & Plan Note (Signed)
On statin.

## 2021-10-23 NOTE — Assessment & Plan Note (Signed)
Did did not improve with permethrin cream alone Added oral ivermectin for resistant scabies

## 2021-10-26 ENCOUNTER — Ambulatory Visit: Payer: Medicare Other | Admitting: Family Medicine

## 2021-10-30 ENCOUNTER — Telehealth: Payer: Self-pay

## 2021-10-30 NOTE — Telephone Encounter (Signed)
Good rx card given

## 2021-10-30 NOTE — Telephone Encounter (Signed)
Patient came by office and lost her coupon for ivermectin (STROMECTOL) 3 MG TABS tablet , needs another coupon.

## 2021-10-31 ENCOUNTER — Encounter: Payer: Self-pay | Admitting: Family Medicine

## 2021-10-31 ENCOUNTER — Ambulatory Visit (INDEPENDENT_AMBULATORY_CARE_PROVIDER_SITE_OTHER): Payer: 59 | Admitting: Family Medicine

## 2021-10-31 ENCOUNTER — Other Ambulatory Visit (HOSPITAL_COMMUNITY)
Admission: RE | Admit: 2021-10-31 | Discharge: 2021-10-31 | Disposition: A | Discharge status: Home / Self Care | Payer: 59 | Source: Ambulatory Visit | Attending: Family Medicine | Admitting: Family Medicine

## 2021-10-31 VITALS — BP 96/65 | HR 86 | Ht 62.0 in | Wt 195.1 lb

## 2021-10-31 DIAGNOSIS — Z124 Encounter for screening for malignant neoplasm of cervix: Secondary | ICD-10-CM | POA: Diagnosis not present

## 2021-10-31 DIAGNOSIS — Z01419 Encounter for gynecological examination (general) (routine) without abnormal findings: Secondary | ICD-10-CM | POA: Diagnosis present

## 2021-10-31 DIAGNOSIS — Z1151 Encounter for screening for human papillomavirus (HPV): Secondary | ICD-10-CM | POA: Diagnosis not present

## 2021-10-31 NOTE — Patient Instructions (Addendum)
I appreciate the opportunity to provide care to you today!   Follow-up: 04/26/2022   Please continue to a heart-healthy diet and increase your physical activities. Try to exercise for at least three times a week.      It was a pleasure to see you and I look forward to continuing to work together on your health and well-being. Please do not hesitate to call the office if you need care or have questions about your care.   Have a wonderful day and week. With Gratitude, Gilmore Laroche MSN, FNP-BC

## 2021-10-31 NOTE — Progress Notes (Signed)
Established Patient Office Visit  Subjective:  Patient ID: Patty [name], female    DOB: 07/27/1965  Age: 56 y.o. MRN: [id number]  CC:  Chief Complaint  Patient presents with   Gynecologic Exam    Pt here for pap smear.     HPI Patty [name] is a 56 y.o. female with past medical history of essential hypertension, stroke  presents for a pap smear.  Past Medical History:  Diagnosis Date   Acute respiratory failure with hypoxia ([place]) 10/18/2014   Asthma    Cholecystitis with cholelithiasis 07/12/2014   Cholelithiasis with acute cholecystitis 07/13/2014   DDD (degenerative disc disease), lumbar    Depression    HTN (hypertension)    Hyperlipidemia    Rectal bleed 11/13/2011   Seizures ([place])    remote   Stroke [facility]) 1990   chronic right hemiparesis    Past Surgical History:  Procedure Laterality Date   CESAREAN SECTION     CHOLECYSTECTOMY N/A 07/13/2014   Procedure: LAPAROSCOPIC CONVERTED TO OPEN  CHOLECYSTECTOMY ;  Surgeon: Armandina Gemma, MD;  Location: [facility];  Service: General;  Laterality: N/A;   COLONOSCOPY  12/24/2011   Procedure: COLONOSCOPY;  Surgeon: Danie Binder, MD;  [facility];  Service: Endoscopy;  Laterality: N/A;  2:00   GALLBLADDER SURGERY     [name]   PARTIAL HYSTERECTOMY     heavy bleeding    Family History  Problem Relation Age of Onset   Stroke Father    Diabetes Other    Colon cancer Neg Hx    Liver disease Neg Hx     Social History   Socioeconomic History   Marital status: Divorced    Spouse name: Not on file   Number of children: 2   Years of education: Not on file   Highest education level: Not on file  Occupational History   Occupation: disabled    Employer: UNEMPLOYED  Tobacco Use   Smoking status: Never   Smokeless tobacco: Never  Vaping Use   Vaping Use: Never used  Substance and Sexual Activity   Alcohol use: Yes    Alcohol/week: 2.0 standard drinks of alcohol    Types: 2 Standard drinks or  equivalent per week    Comment: couple of drinks on weekends   Drug use: No   Sexual activity: Not Currently  Other Topics Concern   Not on file  Social History Narrative   On disability. Used to work at [employer]/Worked at [employer] Retired now   [employer]    Two children.    Daughter: 72    Son: 66   Has seven grand children.    Enjoys reading      Diet: eats lots of fruits and veggies, occasional meat: chicken mostly, likes pasta    Caffeine: Tea-green lipton   Water: about 5 cups daily      Wears seatbelt   Smoke detectors      Social Determinants of Health   Financial Resource Strain: Low Risk  (05/30/2021)   Overall Financial Resource Strain (CARDIA)    Difficulty of Paying Living Expenses: Not hard at all  Food Insecurity: No Food Insecurity (05/30/2021)   Hunger Vital Sign    Worried About Running Out of Food in the Last Year: Never true    Ran Out of Food in the Last Year: Never true  Transportation Needs: Unmet Transportation Needs (05/30/2021)   PRAPARE - Transportation  Lack of Transportation (Medical): Yes    Lack of Transportation (Non-Medical): Yes  Physical Activity: Sufficiently Active (12/25/2020)   Exercise Vital Sign    Days of Exercise per Week: 5 days    Minutes of Exercise per Session: 30 min  Stress: Stress Concern Present (05/30/2021)   [place]    Feeling of Stress : Very much  Social Connections: Moderately Integrated (12/25/2020)   Social Connection and Isolation Panel [NHANES]    Frequency of Communication with Friends and Family: More than three times a week    Frequency of Social Gatherings with Friends and Family: More than three times a week    Attends Religious Services: 1 to 4 times per year    Active Member of [employer] or Organizations: No    Attends Archivist Meetings: 1 to 4 times per year    Marital Status: Divorced  Human resources officer Violence: Not At  Risk (12/25/2020)   Humiliation, Afraid, Rape, and Kick questionnaire    Fear of Current or Ex-Partner: No    Emotionally Abused: No    Physically Abused: No    Sexually Abused: No    Outpatient Medications Prior to Visit  Medication Sig Dispense Refill   albuterol (ACCUNEB) 0.63 MG/3ML nebulizer solution Take 3 mLs (0.63 mg total) by nebulization every 6 (six) hours as needed for wheezing or shortness of breath. 3 mL 3   aspirin EC 81 MG tablet Take 1 tablet (81 mg total) by mouth daily.     budesonide-formoterol (SYMBICORT) 80-4.5 MCG/ACT inhaler INHALE 2 PUFFS BY MOUTH EVERY 12 HOURS 10.2 g 2   dicyclomine (BENTYL) 10 MG capsule Take 1 capsule (10 mg total) by mouth 4 (four) times daily as needed for spasms. 30 capsule 1   divalproex (DEPAKOTE) 250 MG DR tablet TAKE 1 TABLET BY MOUTH THREE TIMES DAILY. 90 tablet 0   FLUoxetine (PROZAC) 10 MG tablet TAKE 1 TABLET(10 MG) BY MOUTH DAILY 30 tablet 2   levalbuterol (XOPENEX HFA) 45 MCG/ACT inhaler INHALE 1 TO 2 PUFFS BY MOUTH EVERY 4 TO 6 HOURS AS NEEDED FOR WHEEZING 15 g 5   meclizine (ANTIVERT) 25 MG tablet Take 1 tablet (25 mg total) by mouth 3 (three) times daily as needed for dizziness. 30 tablet 0   Nebulizers (MY MDI PORTABLE NEBULISER) MISC 1 application by Does not apply route as needed. 1 each 0   ondansetron (ZOFRAN) 4 MG tablet Take 1 tablet (4 mg total) by mouth every 8 (eight) hours as needed for nausea or vomiting. 20 tablet 0   oxybutynin (DITROPAN-XL) 5 MG 24 hr tablet Take 5 mg by mouth daily.     permethrin (ELIMITE) 5 % cream Apply 1 Application topically once for 1 dose. Please apply from neck down to toes. 60 g 0   pravastatin (PRAVACHOL) 20 MG tablet TAKE 1 TABLET(20 MG) BY MOUTH DAILY 90 tablet 2   predniSONE (STERAPRED UNI-PAK 48 TAB) 10 MG (48) TBPK tablet Take by mouth daily. 12 days ds as directed 48 tablet 0   triamcinolone (KENALOG) 0.025 % ointment Apply 1 application topically 3 (three) times daily. 90 g 0    UNABLE TO FIND 1 application by Other route daily. Wrist splint for carpal tunnel syndrome: Apply it daily. 1 each 0   verapamil (CALAN) 120 MG tablet Take 1 tablet (120 mg total) by mouth daily. 90 tablet 1   No facility-administered medications prior to visit.  Allergies  Allergen Reactions   Bee Venom Swelling    ROS Review of Systems  Constitutional:  Negative for fatigue and fever.  HENT:  Negative for sneezing and sore throat.   Respiratory:  Negative for shortness of breath.   Cardiovascular:  Negative for chest pain and palpitations.  Gastrointestinal:  Negative for nausea and vomiting.  Endocrine: Negative for polydipsia, polyphagia and polyuria.  Skin:  Negative for rash and wound.      Objective:    Physical Exam HENT:     Head: Normocephalic.  Cardiovascular:     Rate and Rhythm: Normal rate and regular rhythm.     Pulses: Normal pulses.     Heart sounds: Normal heart sounds.  Pulmonary:     Effort: Pulmonary effort is normal.     Breath sounds: Normal breath sounds.  Abdominal:     Palpations: Abdomen is soft.  Genitourinary:    General: Normal vulva.     Exam position: Lithotomy position.     Pubic Area: No rash.      Tanner stage (genital): 5.     Comments: Vaginal wall: pink and rugated, smooth and non-tender; absence of lesions, edema, and erythema. Labia Majora and Minora: present bilaterally, moist, soft tissue, and homogeneous; free of edema and ulcerations. Clitoris is anatomically present, above the urethral, and free of lesions, masses, and ulceration.   Neurological:     Mental Status: She is alert.     BP 96/65   Pulse 86   Ht _0  (1.575 m)   Wt 195 lb 1.3 oz (88.5 kg)   SpO2 94%   BMI 35.68 kg/m  Wt Readings from Last 3 Encounters:  10/31/21 195 lb 1.3 oz (88.5 kg)  10/23/21 201 lb 3.2 oz (91.3 kg)  10/15/21 195 lb 6.4 oz (88.6 kg)    Lab Results  Component Value Date   TSH 1.840 09/21/2020   Lab Results  Component  Value Date   WBC 5.9 08/16/2021   HGB 14.2 08/16/2021   HCT 41.6 08/16/2021   MCV 91 08/16/2021   PLT 244 08/16/2021   Lab Results  Component Value Date   NA 141 08/16/2021   K 4.7 08/16/2021   CO2 25 08/16/2021   GLUCOSE 89 08/16/2021   BUN 17 08/16/2021   CREATININE 0.85 08/16/2021   BILITOT <0.2 09/21/2020   ALKPHOS 97 09/21/2020   AST 13 09/21/2020   ALT 18 09/21/2020   PROT 6.7 09/21/2020   ALBUMIN 4.1 09/21/2020   CALCIUM 9.4 08/16/2021   ANIONGAP 7 03/07/2016   EGFR 80 08/16/2021   Lab Results  Component Value Date   CHOL 192 08/16/2021   Lab Results  Component Value Date   HDL 51 08/16/2021   Lab Results  Component Value Date   LDLCALC 112 (H) 08/16/2021   Lab Results  Component Value Date   TRIG 166 (H) 08/16/2021   Lab Results  Component Value Date   CHOLHDL 3.8 08/16/2021   Lab Results  Component Value Date   HGBA1C 5.9 (H) 01/02/2021      Assessment & Plan:   Problem List Items Addressed This Visit       Other   Cervical cancer screening    - According to the Industry, cytology and HPV co-testing (preferred) every 5 years or cytology alone (acceptable) every 3 years. - Pap due 2026       Other Visit Diagnoses     Screening for cervical cancer    -  Primary   Relevant Orders   Cytology - PAP       No orders of the defined types were placed in this encounter.   Follow-up: No follow-ups on file.    Alvira Monday, FNP

## 2021-10-31 NOTE — Assessment & Plan Note (Signed)
-   According to the [employer], cytology and HPV co-testing (preferred) every 5 years or cytology alone (acceptable) every 3 years. - Pap due 2026  

## 2021-11-01 ENCOUNTER — Ambulatory Visit: Payer: Medicare Other | Admitting: Orthopedic Surgery

## 2021-11-01 ENCOUNTER — Encounter: Payer: Self-pay | Admitting: Orthopedic Surgery

## 2021-11-02 LAB — CYTOLOGY - PAP
Adequacy: ABSENT
Comment: NEGATIVE
Diagnosis: NEGATIVE
High risk HPV: NEGATIVE

## 2021-11-02 NOTE — Progress Notes (Signed)
Please inform the patient that her pap and HPV were negative

## 2021-11-06 ENCOUNTER — Other Ambulatory Visit: Payer: Self-pay | Admitting: Internal Medicine

## 2021-11-06 DIAGNOSIS — J4540 Moderate persistent asthma, uncomplicated: Secondary | ICD-10-CM

## 2021-11-13 ENCOUNTER — Other Ambulatory Visit: Payer: Self-pay | Admitting: *Deleted

## 2021-11-13 MED ORDER — PRAVASTATIN SODIUM 20 MG PO TABS: ORAL_TABLET | ORAL | 2 refills | Status: DC

## 2021-11-14 ENCOUNTER — Encounter: Payer: Self-pay | Admitting: *Deleted

## 2021-12-03 ENCOUNTER — Other Ambulatory Visit: Payer: Self-pay | Admitting: Internal Medicine

## 2021-12-03 DIAGNOSIS — F321 Major depressive disorder, single episode, moderate: Secondary | ICD-10-CM

## 2021-12-04 ENCOUNTER — Telehealth: Payer: Self-pay | Admitting: Internal Medicine

## 2021-12-04 NOTE — Telephone Encounter (Signed)
Tried calling pt, unable to reach her.

## 2021-12-04 NOTE — Telephone Encounter (Signed)
Patient called in for pap results

## 2021-12-05 NOTE — Telephone Encounter (Signed)
LMTRC

## 2021-12-07 NOTE — Telephone Encounter (Signed)
Tried calling pt unable to reach pt.  

## 2021-12-10 NOTE — Telephone Encounter (Signed)
Pt informed

## 2021-12-20 ENCOUNTER — Ambulatory Visit (INDEPENDENT_AMBULATORY_CARE_PROVIDER_SITE_OTHER): Payer: 59 | Admitting: Internal Medicine

## 2021-12-20 ENCOUNTER — Encounter: Payer: Self-pay | Admitting: Internal Medicine

## 2021-12-20 VITALS — BP 122/80 | HR 88 | Ht 62.0 in | Wt 197.0 lb

## 2021-12-20 DIAGNOSIS — Z23 Encounter for immunization: Secondary | ICD-10-CM

## 2021-12-20 DIAGNOSIS — H5711 Ocular pain, right eye: Secondary | ICD-10-CM

## 2021-12-20 NOTE — Assessment & Plan Note (Addendum)
Could be ocular migraine versus glaucoma No visual disturbance currently, but reports dizziness No conjunctival erythema Urgent referral to Ophthalmology Tylenol as needed for headache, she reported mild improvement in headache with it

## 2021-12-20 NOTE — Progress Notes (Signed)
Acute Office Visit  Subjective:    Patient ID: Patty OppenheimClara M [name], female    DOB: 1965/11/16, 56 y.o.   MRN: [id number]  Chief Complaint  Patient presents with   Pain   Headache    Right eye pain for 2days     HPI Patient is in today for complaint of right eye pain and right frontal headache for the last 2 days.  She reports acute onset right eye pain without any redness or vision disturbance.  She denies any previous episode of similar pain.  Denies any discharge.  Denies any local rash.  She also reports mild dizziness, but denies any nausea or vomiting.  Her BP is WNL.  Denies any focal numbness or tingling.  She has history of seizure disorder, but denies any recent seizure episode.  She is on Depakote currently, and states that she takes it regularly.  Past Medical History:  Diagnosis Date   Acute respiratory failure with hypoxia (HCC) 10/18/2014   Asthma    Cholecystitis with cholelithiasis 07/12/2014   Cholelithiasis with acute cholecystitis 07/13/2014   DDD (degenerative disc disease), lumbar    Depression    HTN (hypertension)    Hyperlipidemia    Rectal bleed 11/13/2011   Seizures (HCC)    remote   Stroke [facility](HCC) 1990   chronic right hemiparesis    Past Surgical History:  Procedure Laterality Date   CESAREAN SECTION     CHOLECYSTECTOMY N/A 07/13/2014   Procedure: LAPAROSCOPIC CONVERTED TO OPEN  CHOLECYSTECTOMY ;  Surgeon: Darnell Levelodd Gerkin, MD;  Location: [facility];  Service: General;  Laterality: N/A;   COLONOSCOPY  12/24/2011   Procedure: COLONOSCOPY;  Surgeon: West BaliSandi L Fields, MD;  Location: [facility];  Service: Endoscopy;  Laterality: N/A;  2:00   [place]   PARTIAL HYSTERECTOMY     heavy bleeding    Family History  Problem Relation Age of Onset   Stroke Father    Diabetes Other    Colon cancer Neg Hx    Liver disease Neg Hx     Social History   Socioeconomic History   Marital status: Divorced    Spouse name: Not on file    Number of children: 2   Years of education: Not on file   Highest education level: Not on file  Occupational History   Occupation: disabled    Employer: UNEMPLOYED  Tobacco Use   Smoking status: Never   Smokeless tobacco: Never  Vaping Use   Vaping Use: Never used  Substance and Sexual Activity   Alcohol use: Yes    Alcohol/week: 2.0 standard drinks of alcohol    Types: 2 Standard drinks or equivalent per week    Comment: couple of drinks on weekends   Drug use: No   Sexual activity: Not Currently  Other Topics Concern   Not on file  Social History Narrative   On disability. Used to work at [employer]/Worked at [employer] Retired now   [employer]orced.    Two children.    Daughter: [phone]    Son: [phone]   Has seven grand children.    Enjoys reading      Diet: eats lots of fruits and veggies, occasional meat: chicken mostly, likes pasta    Caffeine: Tea-green lipton   Water: about 5 cups daily      Wears seatbelt   Smoke detectors      Social Determinants of Health   Financial  Resource Strain: Low Risk  (05/30/2021)   Overall Financial Resource Strain (CARDIA)    Difficulty of Paying Living Expenses: Not hard at all  Food Insecurity: No Food Insecurity (05/30/2021)   Hunger Vital Sign    Worried About Running Out of Food in the Last Year: Never true    Ran Out of Food in the Last Year: Never true  Transportation Needs: Unmet Transportation Needs (05/30/2021)   PRAPARE - Transportation    Lack of Transportation (Medical): Yes    Lack of Transportation (Non-Medical): Yes  Physical Activity: Sufficiently Active (12/25/2020)   Exercise Vital Sign    Days of Exercise per Week: 5 days    Minutes of Exercise per Session: 30 min  Stress: Stress Concern Present (05/30/2021)   [employer] of Occupational Health - Occupational Stress Questionnaire    Feeling of Stress : Very much  Social Connections: Moderately Integrated (12/25/2020)   Social Connection and Isolation Panel [NHANES]     Frequency of Communication with Friends and Family: More than three times a week    Frequency of Social Gatherings with Friends and Family: More than three times a week    Attends Religious Services: 1 to 4 times per year    Active Member of [employer] or Organizations: No    Attends Banker Meetings: 1 to 4 times per year    Marital Status: Divorced  Catering manager Violence: Not At Risk (12/25/2020)   Humiliation, Afraid, Rape, and Kick questionnaire    Fear of Current or Ex-Partner: No    Emotionally Abused: No    Physically Abused: No    Sexually Abused: No    Outpatient Medications Prior to Visit  Medication Sig Dispense Refill   albuterol (ACCUNEB) 0.63 MG/3ML nebulizer solution Take 3 mLs (0.63 mg total) by nebulization every 6 (six) hours as needed for wheezing or shortness of breath. 3 mL 3   aspirin EC 81 MG tablet Take 1 tablet (81 mg total) by mouth daily.     budesonide-formoterol (SYMBICORT) 80-4.5 MCG/ACT inhaler INHALE 2 PUFFS BY MOUTH EVERY 12 HOURS 10.2 g 2   dicyclomine (BENTYL) 10 MG capsule Take 1 capsule (10 mg total) by mouth 4 (four) times daily as needed for spasms. 30 capsule 1   divalproex (DEPAKOTE) 250 MG DR tablet TAKE 1 TABLET BY MOUTH THREE TIMES DAILY. 90 tablet 0   FLUoxetine (PROZAC) 10 MG tablet TAKE 1 TABLET(10 MG) BY MOUTH DAILY 30 tablet 2   levalbuterol (XOPENEX HFA) 45 MCG/ACT inhaler INHALE 1 TO 2 PUFFS BY MOUTH EVERY 4 TO 6 HOURS AS NEEDED FOR WHEEZING 15 g 5   meclizine (ANTIVERT) 25 MG tablet Take 1 tablet (25 mg total) by mouth 3 (three) times daily as needed for dizziness. 30 tablet 0   Nebulizers (MY MDI PORTABLE NEBULISER) MISC 1 application by Does not apply route as needed. 1 each 0   ondansetron (ZOFRAN) 4 MG tablet Take 1 tablet (4 mg total) by mouth every 8 (eight) hours as needed for nausea or vomiting. 20 tablet 0   oxybutynin (DITROPAN-XL) 5 MG 24 hr tablet Take 5 mg by mouth daily.     permethrin (ELIMITE) 5 % cream  Apply 1 Application topically once for 1 dose. Please apply from neck down to toes. 60 g 0   pravastatin (PRAVACHOL) 20 MG tablet TAKE 1 TABLET(20 MG) BY MOUTH DAILY 90 tablet 2   triamcinolone (KENALOG) 0.025 % ointment Apply 1 application topically 3 (three) times  daily. 90 g 0   UNABLE TO FIND 1 application by Other route daily. Wrist splint for carpal tunnel syndrome: Apply it daily. 1 each 0   verapamil (CALAN) 120 MG tablet Take 1 tablet (120 mg total) by mouth daily. 90 tablet 1   predniSONE (STERAPRED UNI-PAK 48 TAB) 10 MG (48) TBPK tablet Take by mouth daily. 12 days ds as directed 48 tablet 0   No facility-administered medications prior to visit.    Allergies  Allergen Reactions   Bee Venom Swelling    Review of Systems  Constitutional:  Positive for fatigue. Negative for chills and fever.  HENT:  Negative for congestion, sinus pressure, sinus pain and sore throat.   Eyes:  Positive for pain. Negative for discharge and visual disturbance.  Respiratory:  Negative for cough and shortness of breath.   Cardiovascular:  Negative for chest pain and palpitations.  Gastrointestinal:  Negative for abdominal pain, diarrhea, nausea and vomiting.  Endocrine: Negative for polydipsia and polyuria.  Genitourinary:  Negative for dysuria and hematuria.  Musculoskeletal:  Negative for neck pain and neck stiffness.  Skin:  Negative for rash.  Neurological:  Positive for dizziness and headaches. Negative for weakness.  Psychiatric/Behavioral:  Positive for sleep disturbance. Negative for agitation and behavioral problems.        Objective:    Physical Exam Vitals reviewed.  Constitutional:      General: She is not in acute distress.    Appearance: She is obese. She is not diaphoretic.  HENT:     Head: Normocephalic and atraumatic.     Nose: Nose normal. No congestion.     Mouth/Throat:     Mouth: Mucous membranes are moist.     Pharynx: No posterior oropharyngeal erythema.  Eyes:      General: Vision grossly intact. No scleral icterus.       Right eye: No foreign body.        Left eye: No foreign body.     Extraocular Movements: Extraocular movements intact.     Conjunctiva/sclera:     Right eye: No chemosis.    Left eye: No chemosis. Cardiovascular:     Rate and Rhythm: Normal rate and regular rhythm.     Pulses: Normal pulses.     Heart sounds: Normal heart sounds. No murmur heard. Pulmonary:     Breath sounds: No wheezing or rales.  Musculoskeletal:     Cervical back: Neck supple. No tenderness.     Right lower leg: No edema.     Left lower leg: No edema.  Skin:    General: Skin is warm.     Findings: No rash.  Neurological:     General: No focal deficit present.     Mental Status: She is alert and oriented to person, place, and time.     Motor: Weakness (RUE - 4/5, RLE - 4/5) present.  Psychiatric:        Mood and Affect: Mood normal.        Behavior: Behavior normal.     BP 122/80 (BP Location: Left Arm, Patient Position: Sitting)   Pulse 88   Ht 5\' 2"  (1.575 m)   Wt 197 lb (89.4 kg)   SpO2 96%   BMI 36.03 kg/m  Wt Readings from Last 3 Encounters:  12/20/21 197 lb (89.4 kg)  10/31/21 195 lb 1.3 oz (88.5 kg)  10/23/21 201 lb 3.2 oz (91.3 kg)        Assessment & Plan:  Problem List Items Addressed This Visit       Other   Acute right eye pain - Primary    Could be ocular migraine versus glaucoma No visual disturbance currently, but reports dizziness No conjunctival erythema Urgent referral to Ophthalmology Tylenol as needed for headache, she reported mild improvement in headache with it      Relevant Orders   Ambulatory referral to Ophthalmology   Other Visit Diagnoses     Need for immunization against influenza       Relevant Orders   Flu Vaccine QUAD 57mo+IM (Fluarix, Fluzone & Alfiuria Quad PF) (Completed)        No orders of the defined types were placed in this encounter.    Lindell Spar, MD

## 2021-12-20 NOTE — Patient Instructions (Signed)
Okay to take Ibuprofen or Tylenol as needed for headache.  You are being referred to Ophthalmology.

## 2021-12-26 ENCOUNTER — Ambulatory Visit: Payer: Medicare Other

## 2022-01-01 ENCOUNTER — Ambulatory Visit (INDEPENDENT_AMBULATORY_CARE_PROVIDER_SITE_OTHER): Payer: 59 | Admitting: Neurology

## 2022-01-01 ENCOUNTER — Encounter: Payer: Self-pay | Admitting: Neurology

## 2022-01-01 VITALS — BP 118/76 | HR 60 | Ht 62.0 in | Wt 188.2 lb

## 2022-01-01 DIAGNOSIS — G8191 Hemiplegia, unspecified affecting right dominant side: Secondary | ICD-10-CM | POA: Diagnosis not present

## 2022-01-01 DIAGNOSIS — I6389 Other cerebral infarction: Secondary | ICD-10-CM | POA: Diagnosis not present

## 2022-01-01 DIAGNOSIS — R569 Unspecified convulsions: Secondary | ICD-10-CM | POA: Diagnosis not present

## 2022-01-01 MED ORDER — DIVALPROEX SODIUM 250 MG PO DR TAB: 250.0000 mg | DELAYED_RELEASE_TABLET | Freq: Three times a day (TID) | ORAL | 4 refills | Status: DC

## 2022-01-01 NOTE — Progress Notes (Signed)
[facility]    Provider:  Dr Lucia Gaskins Requesting Provider: Anabel Halon, MD Primary Care Provider:  Anabel Halon, MD  CC: med refill  January 01, 2022: I have seen patient in the past for seizures on Depakote for decades. Here for f/u med refill but haven;t seen her since 2020, has been stable, no seizures, pcp can refill.  She has a past medical history of stroke, hypertension, obstructive sleep apnea, asthma, seizure disorder, right hemiparesis (chronic from remote stroke per patient), peripheral neuropathy for at least several years, scabies, vertigo, prediabetes, morbid obesity, hyperlipidemia, high risk medicine use, fatigue, depression.  I reviewed her last note by her primary care and she was referred to ophthalmology for eye pain urgently in September 2023.  Prior note in July of this year she was complaining of generalized itching all over her arms and legs recently treated with permethrin cream for scabies which improved her itching for 2 days. She states she is here for refill but we haven't seen her since 2020 she is stable no seizures doing well for decades on depakote no problems will efill but can go back to pcp to fill.  Review of records showed: In February 2023 hemoglobin A1c was ordered but not completed TSH was ordered but not completed.  Hemoglobin A1c in October 2022 was 5.9 and over the last 4 years it appears to range between 5.9 and 6.1.  CBC and BMP in May 2023 this year were essentially normal unremarkable.  LDL at the same time showed elevated triglycerides at 166, LDL 112.  Patient complains of symptoms per HPI as well as the following symptoms: eye pain improved . Pertinent negatives and positives per HPI. All others negative   HPI 12/28/2018:  Patty [name] is a 56 y.o. female here as requested by Anabel Halon, MD for seizures.  Past medical history remote stroke, seizures, hyperlipidemia, hyperglycemia, morbid obesity, asthma, hypertension,  depression, degenerative disc disease stroke in 1990. It was here in the [facility], she doesn't remember. She never had seizures before. Very poor historian. She has been on the Depakote since then. She has done well on the Depakote, no problems and no seizures. She wants to continue it. She has no idea. She tried to go without the medication for a few days and had side effects. Sheis supposed to be taking calcium and vitamin D. Asa 81mg  for stroke prevention. She has some weakness on the right side. She has numbness. No other focal neurologic deficits, associated symptoms, inciting events or modifiable factors.  I reviewed CT of the head report from 2006.  The images were not available to me.  Generalized cerebral and cerebellar atrophic changes, ectatic dilatation of the left parietal sulcus, And ipsilateral dilatation of the left lateral ventricle, this may be result of chronic ischemic change, no acute intracranial abnormality.  Ipsilateral dilatation of the left lateral ventricle.  This may be a result of chronic ischemic changes.  Nothing acute.    Valproic acid level 42.7  Review of Systems: Patient complains of symptoms per HPI as well as the following symptoms: no current symptoms. Pertinent negatives and positives per HPI. All others negative.   Social History   Socioeconomic History   Marital status: Divorced    Spouse name: Not on file   Number of children: 2   Years of education: Not on file   Highest education level: Not on file  Occupational History   Occupation: disabled  Employer: UNEMPLOYED  Tobacco Use   Smoking status: Never   Smokeless tobacco: Never  Vaping Use   Vaping Use: Never used  Substance and Sexual Activity   Alcohol use: Yes    Alcohol/week: 2.0 standard drinks of alcohol    Types: 2 Glasses of wine per week    Comment: couple of drinks on weekends   Drug use: No   Sexual activity: Not Currently  Other Topics Concern   Not on file  Social History  Narrative   On disability. Used to work at [employer]/Worked at [employer] Retired now   [employer].    Two children.    Daughter: 59    Son: 15   Has 10 grand children.    Enjoys reading      Diet: eats lots of fruits and veggies, occasional meat: chicken mostly, likes pasta    Caffeine: Tea-green lipton   Water: about 5 cups daily      Wears seatbelt   Smoke detectors      Social Determinants of Health   Financial Resource Strain: Low Risk  (05/30/2021)   Overall Financial Resource Strain (CARDIA)    Difficulty of Paying Living Expenses: Not hard at all  Food Insecurity: No Food Insecurity (05/30/2021)   Hunger Vital Sign    Worried About Running Out of Food in the Last Year: Never true    Ran Out of Food in the Last Year: Never true  Transportation Needs: Unmet Transportation Needs (05/30/2021)   PRAPARE - Transportation    Lack of Transportation (Medical): Yes    Lack of Transportation (Non-Medical): Yes  Physical Activity: Sufficiently Active (12/25/2020)   Exercise Vital Sign    Days of Exercise per Week: 5 days    Minutes of Exercise per Session: 30 min  Stress: Stress Concern Present (05/30/2021)   [employer] of Occupational Health - Occupational Stress Questionnaire    Feeling of Stress : Very much  Social Connections: Moderately Integrated (12/25/2020)   Social Connection and Isolation Panel [NHANES]    Frequency of Communication with Friends and Family: More than three times a week    Frequency of Social Gatherings with Friends and Family: More than three times a week    Attends Religious Services: 1 to 4 times per year    Active Member of Clubs or Organizations: No    Attends Banker Meetings: 1 to 4 times per year    Marital Status: Divorced  Catering manager Violence: Not At Risk (12/25/2020)   Humiliation, Afraid, Rape, and Kick questionnaire    Fear of Current or Ex-Partner: No    Emotionally Abused: No    Physically Abused: No     Sexually Abused: No    Family History  Problem Relation Age of Onset   Stroke Mother    Heart attack Mother    Stroke Father    Diabetes Other    Colon cancer Neg Hx    Liver disease Neg Hx     Past Medical History:  Diagnosis Date   Acute respiratory failure with hypoxia (HCC) 10/18/2014   Asthma    Cholecystitis with cholelithiasis 07/12/2014   Cholelithiasis with acute cholecystitis 07/13/2014   DDD (degenerative disc disease), lumbar    Depression    HTN (hypertension)    Hx of scabies    Hyperlipidemia    Rectal bleed 11/13/2011   Seizures (HCC)    remote   Stroke (HCC) 1990   chronic right hemiparesis  Patient Active Problem List   Diagnosis Date Noted   Acute right eye pain 12/20/2021   Cervical cancer screening 10/31/2021   Right hemiparesis (HCC) 10/23/2021   Scabies 10/23/2021   Oropharyngeal dysphagia 08/16/2021   Vertigo 08/16/2021   CVA, old, hemiparesis (HCC) 04/12/2021   Depression, recurrent (HCC) 04/12/2021   Prediabetes 04/12/2021   Obstructive sleep apnea syndrome 04/12/2021   Fatigue 09/20/2020   Chest pain 09/20/2020   Peripheral neuropathy 04/10/2020   Moderate persistent asthma with acute exacerbation 08/31/2019   Mixed hyperlipidemia 12/09/2018   Morbid obesity due to excess calories (HCC) 12/09/2018   High risk medication use 04/25/2017   Essential hypertension 10/18/2014   Seizure disorder (HCC) 10/18/2014   Stroke (HCC) 1990    Past Surgical History:  Procedure Laterality Date   CESAREAN SECTION     CHOLECYSTECTOMY N/A 07/13/2014   Procedure: LAPAROSCOPIC CONVERTED TO OPEN  CHOLECYSTECTOMY ;  Surgeon: Darnell Level, MD;  Location: [facility];  Service: General;  Laterality: N/A;   COLONOSCOPY  12/24/2011   Procedure: COLONOSCOPY;  Surgeon: West Bali, MD;  Location[facility];  Service: Endoscopy;  Laterality: N/A;  2:00   GALLBLADDER SURGERY     [name]   PARTIAL HYSTERECTOMY     heavy bleeding     Current Outpatient Medications  Medication Sig Dispense Refill   albuterol (ACCUNEB) 0.63 MG/3ML nebulizer solution Take 3 mLs (0.63 mg total) by nebulization every 6 (six) hours as needed for wheezing or shortness of breath. 3 mL 3   aspirin EC 81 MG tablet Take 1 tablet (81 mg total) by mouth daily.     budesonide-formoterol (SYMBICORT) 80-4.5 MCG/ACT inhaler INHALE 2 PUFFS BY MOUTH EVERY 12 HOURS 10.2 g 2   dicyclomine (BENTYL) 10 MG capsule Take 1 capsule (10 mg total) by mouth 4 (four) times daily as needed for spasms. 30 capsule 1   FLUoxetine (PROZAC) 10 MG tablet TAKE 1 TABLET(10 MG) BY MOUTH DAILY 30 tablet 2   levalbuterol (XOPENEX HFA) 45 MCG/ACT inhaler INHALE 1 TO 2 PUFFS BY MOUTH EVERY 4 TO 6 HOURS AS NEEDED FOR WHEEZING 15 g 5   meclizine (ANTIVERT) 25 MG tablet Take 1 tablet (25 mg total) by mouth 3 (three) times daily as needed for dizziness. 30 tablet 0   Nebulizers (MY MDI PORTABLE NEBULISER) MISC 1 application by Does not apply route as needed. 1 each 0   ondansetron (ZOFRAN) 4 MG tablet Take 1 tablet (4 mg total) by mouth every 8 (eight) hours as needed for nausea or vomiting. 20 tablet 0   oxybutynin (DITROPAN-XL) 5 MG 24 hr tablet Take 5 mg by mouth daily.     pravastatin (PRAVACHOL) 20 MG tablet TAKE 1 TABLET(20 MG) BY MOUTH DAILY 90 tablet 2   triamcinolone (KENALOG) 0.025 % ointment Apply 1 application topically 3 (three) times daily. 90 g 0   UNABLE TO FIND 1 application by Other route daily. Wrist splint for carpal tunnel syndrome: Apply it daily. 1 each 0   verapamil (CALAN) 120 MG tablet Take 1 tablet (120 mg total) by mouth daily. 90 tablet 1   divalproex (DEPAKOTE) 250 MG DR tablet Take 1 tablet (250 mg total) by mouth 3 (three) times daily. 270 tablet 4   No current facility-administered medications for this visit.    Allergies as of 01/01/2022 - Review Complete 01/01/2022  Allergen Reaction Noted   Bee venom Swelling 11/20/2020    Vitals: BP 118/76  Pulse 60   Ht 5\' 2"  (1.575 m)   Wt 188 lb 3.2 oz (85.4 kg)   BMI 34.42 kg/m  Last Weight:  Wt Readings from Last 1 Encounters:  01/01/22 188 lb 3.2 oz (85.4 kg)   Last Height:   Ht Readings from Last 1 Encounters:  01/01/22 5\' 2"  (1.575 m)   Exam: NAD, pleasant                  Speech:    Speech is normal; fluent and spontaneous with normal comprehension.  Cognition:    The patient is oriented to person, place, and time;     recent and remote memory intact;     language fluent;    Cranial Nerves:    The pupils are equal, round, and reactive to light.Trigeminal sensation is intact and the muscles of mastication are normal. The face is symmetric. The palate elevates in the midline. Hearing intact. Voice is normal. Shoulder shrug is normal. The tongue has normal motion without fasciculations.   Coordination:  No dysmetria  Motor Observation:    No asymmetry, no atrophy, and no involuntary movements noted. Tone:    Normal muscle tone.     Strength:   Some mild right hemiparesis      Sensation: intact to LT   Assessment/Plan:  56 y.o. female here as requested by Freddy FinnerMills, Hannah M, NP for seizures.  Past medical history remote stroke, seizures, hyperlipidemia, hyperglycemia, morbid obesity, asthma, hypertension, depression, degenerative disc disease stroke in 1990.  Patient is a very poor historian, she says she had a stroke in 1990 and then had seizures, she does not remember what kind of seizures will give me any kind of description, how many she had, how long they lasted  although she endorses that she has been doing great on the Depakote for many years.  She says she tried to come off the Depakote once and had problems which she could not elaborate on.   -I could not find any records regarding her stroke or seizures.  Patient is an incredibly poor historian.  I think at this point given past stroke followed by seizure, we can assume that the stroke is the seizure focus.  She has  been doing very well on Depakote for many years, she does not wish to stop it.  Her level is slightly subtherapeutic however I would not change anything, since she is doing so well and not had any seizures I would just recommend keeping her on it for life.  She does not want to stop it, said when she tried to she had problems although could not elaborate.  - She states she is here for refill but we haven't seen her since 2020 she is stable no seizures doing well for decades on depakote no problems will refill but can go back to pcp to fills since so stable.   -She can return back to primary care since she has been stable on Depakote for decades.  I would keep her on the same dose.  I would only change management if she had another seizure.  -I discussed risk of seizures, safety concerns, if she were to have another seizure she cannot drive for 6 to 12 months per [facility].  - Discussed: Continue ASAfor secondary stroke prevention and maintain strict control of hypertension with blood pressure goal below 130/90, diabetes with hemoglobin A1c goal below 6.5% and lipids with LDL cholesterol goal below 70 mg/dL. I also advised  the patient to eat a healthy diet with plenty of whole grains, lean meats, fruits and vegetables, exercise regularly and maintain ideal body weight    Meds ordered this encounter  Medications   divalproex (DEPAKOTE) 250 MG DR tablet    Sig: Take 1 tablet (250 mg total) by mouth 3 (three) times daily.    Dispense:  270 tablet    Refill:  4    I spent 30 minutes of face-to-face and non-face-to-face time with patient on the  1. Seizures ([place])   2. Cerebrovascular accident (CVA) due to other mechanism ([place])   3. Right hemiparesis ([place])    diagnosis.  This included previsit chart review, lab review, study review, order entry, electronic health record documentation, patient education on the different diagnostic and therapeutic options, counseling and coordination of care,  risks and benefits of management, compliance, or risk factor reduction   Cc: Lindell Spar, MD,    [facility] [address] [place], [place] [address]  Phone [phone] Fax [phone]

## 2022-01-02 ENCOUNTER — Other Ambulatory Visit: Payer: Self-pay | Admitting: Internal Medicine

## 2022-01-02 DIAGNOSIS — I10 Essential (primary) hypertension: Secondary | ICD-10-CM

## 2022-01-07 ENCOUNTER — Encounter: Payer: 59 | Admitting: Internal Medicine

## 2022-01-08 ENCOUNTER — Ambulatory Visit (INDEPENDENT_AMBULATORY_CARE_PROVIDER_SITE_OTHER): Payer: 59 | Admitting: Internal Medicine

## 2022-01-08 ENCOUNTER — Encounter: Payer: Self-pay | Admitting: Internal Medicine

## 2022-01-08 DIAGNOSIS — Z0000 Encounter for general adult medical examination without abnormal findings: Secondary | ICD-10-CM

## 2022-01-08 NOTE — Patient Instructions (Signed)
  Patty [name] , Thank you for taking time to come for your Medicare Wellness Visit. I appreciate your ongoing commitment to your health goals. Please review the following plan we discussed and let me know if I can assist you in the future.   These are the goals we discussed:  You can receive your Tetanus and Shingles vaccine at the pharmacy. Please bring your Covid Vaccination Card if you would like it updated in the computer. You will schedule your colonoscopy.   Goals      DIET - INCREASE WATER INTAKE     Pt states she would like to eat healthier and walk more.         This is a list of the screening recommended for you and due dates:  Health Maintenance  Topic Date Due   Tetanus Vaccine  Never done   Zoster (Shingles) Vaccine (1 of 2) Never done   COVID-19 Vaccine (4 - Moderna risk series) 07/12/2020   Colon Cancer Screening  12/23/2021   Mammogram  02/08/2023   Pap Smear  10/31/2024   Flu Shot  Completed   Hepatitis C Screening: USPSTF Recommendation to screen - Ages 18-79 yo.  Completed   HIV Screening  Completed   HPV Vaccine  Aged Out

## 2022-01-08 NOTE — Progress Notes (Signed)
Subjective:  This is a telephone encounter between Patty [name] and Patty [name] on 01/08/2022 for AWV. The visit was conducted with the patient located at home and Patty [name] at [facility]. The patient's identity was confirmed using their DOB and current address. The patient has consented to being evaluated through a telephone encounter and understands the associated risks (an examination cannot be done and the patient may need to come in for an appointment) / benefits (allows the patient to remain at home, decreasing exposure to coronavirus).     Patty [name] is a 56 y.o. female who presents for Medicare Annual (Subsequent) preventive examination.  Review of Systems    Review of Systems  All other systems reviewed and are negative.    Objective:    There were no vitals filed for this visit. There is no height or weight on file to calculate BMI.     01/08/2022   11:34 AM 12/25/2020    2:42 PM 07/06/2017   12:14 PM 07/18/2016    1:12 PM 03/07/2016    8:17 AM 09/10/2015    1:00 PM 11/24/2014   12:48 PM  Advanced Directives  Does Patient Have a Medical Advance Directive? Yes No No No No No No  Does patient want to make changes to medical advance directive? Yes (MAU/Ambulatory/Procedural Areas - Information given)        Would patient like information on creating a medical advance directive?  No - Patient declined No - Patient declined No - Patient declined Yes (ED - Information included in AVS) No - patient declined information No - patient declined information    Current Medications (verified) Outpatient Encounter Medications as of 01/08/2022  Medication Sig   albuterol (ACCUNEB) 0.63 MG/3ML nebulizer solution Take 3 mLs (0.63 mg total) by nebulization every 6 (six) hours as needed for wheezing or shortness of breath.   aspirin EC 81 MG tablet Take 1 tablet (81 mg total) by mouth daily.   budesonide-formoterol (SYMBICORT) 80-4.5 MCG/ACT inhaler INHALE 2 PUFFS BY MOUTH EVERY 12 HOURS    dicyclomine (BENTYL) 10 MG capsule Take 1 capsule (10 mg total) by mouth 4 (four) times daily as needed for spasms.   divalproex (DEPAKOTE) 250 MG DR tablet Take 1 tablet (250 mg total) by mouth 3 (three) times daily.   FLUoxetine (PROZAC) 10 MG tablet TAKE 1 TABLET(10 MG) BY MOUTH DAILY   levalbuterol (XOPENEX HFA) 45 MCG/ACT inhaler INHALE 1 TO 2 PUFFS BY MOUTH EVERY 4 TO 6 HOURS AS NEEDED FOR WHEEZING   meclizine (ANTIVERT) 25 MG tablet Take 1 tablet (25 mg total) by mouth 3 (three) times daily as needed for dizziness.   Nebulizers (MY MDI PORTABLE NEBULISER) MISC 1 application by Does not apply route as needed.   ondansetron (ZOFRAN) 4 MG tablet Take 1 tablet (4 mg total) by mouth every 8 (eight) hours as needed for nausea or vomiting.   oxybutynin (DITROPAN-XL) 5 MG 24 hr tablet Take 5 mg by mouth daily.   pravastatin (PRAVACHOL) 20 MG tablet TAKE 1 TABLET(20 MG) BY MOUTH DAILY   UNABLE TO FIND 1 application by Other route daily. Wrist splint for carpal tunnel syndrome: Apply it daily.   verapamil (CALAN) 120 MG tablet TAKE 1 TABLET(120 MG) BY MOUTH DAILY   [DISCONTINUED] triamcinolone (KENALOG) 0.025 % ointment Apply 1 application topically 3 (three) times daily.   No facility-administered encounter medications on file as of 01/08/2022.    Allergies (verified) Bee venom   History:  Past Medical History:  Diagnosis Date   Acute respiratory failure with hypoxia ([place]) 10/18/2014   Asthma    Cholecystitis with cholelithiasis 07/12/2014   Cholelithiasis with acute cholecystitis 07/13/2014   DDD (degenerative disc disease), lumbar    Depression    HTN (hypertension)    Hx of scabies    Hyperlipidemia    Rectal bleed 11/13/2011   Seizures ([place])    remote   Stroke [facility]) 1990   chronic right hemiparesis   Past Surgical History:  Procedure Laterality Date   CESAREAN SECTION     CHOLECYSTECTOMY N/A 07/13/2014   Procedure: LAPAROSCOPIC CONVERTED TO OPEN  CHOLECYSTECTOMY ;  Surgeon:  Armandina Gemma, MD;  Location: [facility];  Service: General;  Laterality: N/A;   COLONOSCOPY  12/24/2011   Procedure: COLONOSCOPY;  Surgeon: Danie Binder, MD;  [facility];  Service: Endoscopy;  Laterality: N/A;  2:00   GALLBLADDER SURGERY     [name]   PARTIAL HYSTERECTOMY     heavy bleeding   Family History  Problem Relation Age of Onset   Stroke Mother    Heart attack Mother    Stroke Father    Diabetes Other    Colon cancer Neg Hx    Liver disease Neg Hx    Social History   Socioeconomic History   Marital status: Divorced    Spouse name: Not on file   Number of children: 2   Years of education: Not on file   Highest education level: Not on file  Occupational History   Occupation: disabled    Employer: UNEMPLOYED  Tobacco Use   Smoking status: Never   Smokeless tobacco: Never  Vaping Use   Vaping Use: Never used  Substance and Sexual Activity   Alcohol use: Yes    Alcohol/week: 2.0 standard drinks of alcohol    Types: 2 Glasses of wine per week    Comment: couple of drinks on weekends   Drug use: No   Sexual activity: Not Currently  Other Topics Concern   Not on file  Social History Narrative   On disability. Used to work at [employer]/Worked at [employer] Retired now   [employer]    Two children.    Daughter: 28    Son: 63   Has 10 grand children.    Enjoys reading      Diet: eats lots of fruits and veggies, occasional meat: chicken mostly, likes pasta    Caffeine: Tea-green lipton   Water: about 5 cups daily      Wears seatbelt   Smoke detectors      Social Determinants of Health   Financial Resource Strain: Low Risk  (05/30/2021)   Overall Financial Resource Strain (CARDIA)    Difficulty of Paying Living Expenses: Not hard at all  Food Insecurity: No Food Insecurity (05/30/2021)   Hunger Vital Sign    Worried About Running Out of Food in the Last Year: Never true    Ran Out of Food in the Last Year: Never true   Transportation Needs: Unmet Transportation Needs (05/30/2021)   PRAPARE - Transportation    Lack of Transportation (Medical): Yes    Lack of Transportation (Non-Medical): Yes  Physical Activity: Sufficiently Active (12/25/2020)   Exercise Vital Sign    Days of Exercise per Week: 5 days    Minutes of Exercise per Session: 30 min  Stress: Stress Concern Present (05/30/2021)   [place]  Questionnaire    Feeling of Stress : Very much  Social Connections: Moderately Integrated (12/25/2020)   Social Connection and Isolation Panel [NHANES]    Frequency of Communication with Friends and Family: More than three times a week    Frequency of Social Gatherings with Friends and Family: More than three times a week    Attends Religious Services: 1 to 4 times per year    Active Member of [employer] or Organizations: No    Attends Music therapist: 1 to 4 times per year    Marital Status: Divorced    Tobacco Counseling Counseling given: Not Answered   Clinical Intake:  Pre-visit preparation completed: Yes  Pain : No/denies pain     Diabetes: Yes CBG done?: No Did pt. bring in CBG monitor from home?: No  How often do you need to have someone help you when you read instructions, pamphlets, or other written materials from your doctor or pharmacy?: 1 - Never What is the last grade level you completed in school?: 10th grade  Diabetic?No         Activities of Daily Living     No data to display          Patient Care Team: Lindell Spar, MD as PCP - General (Internal Medicine) Herminio Commons, MD (Inactive) as PCP - Cardiology (Cardiology) Danie Binder, MD (Inactive) (Gastroenterology)  Indicate any recent Medical Services you may have received from other than [facility] providers in the past year (date may be approximate).     Assessment:   This is a routine wellness examination for Arnold.  Hearing/Vision  screen No results found.  Dietary issues and exercise activities discussed:     Goals Addressed   None    Depression Screen    10/31/2021   10:04 AM 10/23/2021   10:03 AM 10/15/2021   11:47 AM 08/16/2021    3:56 PM 07/20/2021   11:16 AM 05/17/2021    4:08 PM 05/10/2021    3:31 PM  PHQ 2/9 Scores  PHQ - 2 Score 0 0 0 1 0 6 0  PHQ- 9 Score    10  18 0    Fall Risk    01/08/2022   11:34 AM 10/31/2021   10:04 AM 10/23/2021   10:03 AM 10/15/2021   11:47 AM 08/16/2021    3:56 PM  Fall Risk   Falls in the past year? 0 0 0 0 0  Number falls in past yr: 0 0 0 0 0  Injury with Fall? 0 0 0 0 0  Risk for fall due to :  No Fall Risks No Fall Risks No Fall Risks No Fall Risks  Follow up  Falls evaluation completed Falls evaluation completed Falls evaluation completed Falls evaluation completed    [place]:  Any stairs in or around the home? Yes  If so, are there any without handrails? Yes  Home free of loose throw rugs in walkways, pet beds, electrical cords, etc? Yes  Adequate lighting in your home to reduce risk of falls? Yes   ASSISTIVE DEVICES UTILIZED TO PREVENT FALLS:  Life alert? No  Use of a cane, walker or w/c? Yes  Grab bars in the bathroom? No  Shower chair or bench in shower? No  Elevated toilet seat or a handicapped toilet? Yes    Cognitive Function:        12/25/2020    2:48 PM  6CIT Screen  What Year? 0 points  What month? 0 points  What time? 0 points  Count back from 20 0 points  Months in reverse 2 points  Repeat phrase 2 points  Total Score 4 points    Immunizations Immunization History  Administered Date(s) Administered   Influenza,inj,Quad PF,6+ Mos 04/25/2017, 01/02/2021, 12/20/2021   Moderna Sars-Covid-2 Vaccination 11/05/2019, 12/05/2019, 05/17/2020    TDAP status: Due, Education has been provided regarding the importance of this vaccine. Advised may receive this vaccine at local pharmacy or Health Dept.  Aware to provide a copy of the vaccination record if obtained from local pharmacy or Health Dept. Verbalized acceptance and understanding.  Flu Vaccine status: Up to date  Pneumococcal vaccine status: Up to date  Covid-19 vaccine status: Information provided on how to obtain vaccines.   Qualifies for Shingles Vaccine? Yes   Zostavax completed No   Shingrix Completed?: No.    Education has been provided regarding the importance of this vaccine. Patient has been advised to call insurance company to determine out of pocket expense if they have not yet received this vaccine. Advised may also receive vaccine at local pharmacy or Health Dept. Verbalized acceptance and understanding.  Screening Tests Health Maintenance  Topic Date Due   TETANUS/TDAP  Never done   Zoster Vaccines- Shingrix (1 of 2) Never done   COVID-19 Vaccine (4 - Moderna risk series) 07/12/2020   COLONOSCOPY (Pts 45-25yrs Insurance coverage will need to be confirmed)  12/23/2021   MAMMOGRAM  02/08/2023   PAP SMEAR-Modifier  10/31/2024   INFLUENZA VACCINE  Completed   Hepatitis C Screening  Completed   HIV Screening  Completed   HPV VACCINES  Aged Out    Health Maintenance  Health Maintenance Due  Topic Date Due   TETANUS/TDAP  Never done   Zoster Vaccines- Shingrix (1 of 2) Never done   COVID-19 Vaccine (4 - Moderna risk series) 07/12/2020   COLONOSCOPY (Pts 45-20yrs Insurance coverage will need to be confirmed)  12/23/2021    Colorectal cancer screening: Type of screening: Colonoscopy. Completed 12/24/2011. Repeat every 10 years  Mammogram status: Completed 02/07/2021. Repeat every 2 years  Lung Cancer Screening: (Low Dose CT Chest recommended if Age 74-80 years, 30 pack-year currently smoking OR have quit w/in 15years.) does not qualify.   Additional Screening:  Hepatitis C Screening: does qualify; Completed 01/02/2021  Vision Screening: Recommended annual ophthalmology exams for early detection of glaucoma  and other disorders of the eye. Is the patient up to date with their annual eye exam?  Yes  Who is the provider or what is the name of the office in which the patient attends annual eye exams? Dr.Cotter If pt is not established with a provider, would they like to be referred to a provider to establish care? No .   Dental Screening: Recommended annual dental exams for proper oral hygiene  Community Resource Referral / Chronic Care Management: CRR required this visit?  No   CCM required this visit?  No      Plan:     I have personally reviewed and noted the following in the patient's chart:   Medical and social history Use of alcohol, tobacco or illicit drugs  Current medications and supplements including opioid prescriptions. Patient is not currently taking opioid prescriptions. Functional ability and status Nutritional status Physical activity Advanced directives List of other physicians Hospitalizations, surgeries, and ER visits in previous 12 months Vitals Screenings to include cognitive, depression, and falls Referrals and appointments  In addition, I have reviewed and discussed with patient certain preventive protocols, quality metrics, and best practice recommendations. A written personalized care plan for preventive services as well as general preventive health recommendations were provided to patient.     Patty Dy, MD   01/08/2022

## 2022-02-05 ENCOUNTER — Encounter: Payer: Self-pay | Admitting: Internal Medicine

## 2022-02-05 ENCOUNTER — Ambulatory Visit (INDEPENDENT_AMBULATORY_CARE_PROVIDER_SITE_OTHER): Payer: 59 | Admitting: Internal Medicine

## 2022-02-05 ENCOUNTER — Encounter: Payer: Self-pay | Admitting: Orthopedic Surgery

## 2022-02-05 VITALS — BP 120/79 | HR 68 | Ht 62.0 in | Wt 196.4 lb

## 2022-02-05 DIAGNOSIS — J4540 Moderate persistent asthma, uncomplicated: Secondary | ICD-10-CM

## 2022-02-05 DIAGNOSIS — G609 Hereditary and idiopathic neuropathy, unspecified: Secondary | ICD-10-CM

## 2022-02-05 DIAGNOSIS — G5603 Carpal tunnel syndrome, bilateral upper limbs: Secondary | ICD-10-CM | POA: Diagnosis not present

## 2022-02-05 MED ORDER — ALBUTEROL SULFATE 0.63 MG/3ML IN NEBU: 1.0000 | INHALATION_SOLUTION | Freq: Four times a day (QID) | RESPIRATORY_TRACT | 3 refills | Status: DC | PRN

## 2022-02-05 MED ORDER — GABAPENTIN 100 MG PO CAPS: 100.0000 mg | ORAL_CAPSULE | Freq: Two times a day (BID) | ORAL | 3 refills | Status: DC

## 2022-02-05 NOTE — Assessment & Plan Note (Signed)
Her hands symptoms could be due to carpal tunnel syndrome Advised to use wrist brace at nighttime Gabapentin 100 mg twice daily Referred to orthopedic surgery

## 2022-02-05 NOTE — Progress Notes (Signed)
Acute Office Visit  Subjective:    Patient ID: Patty [name], female    DOB: 30-Jan-1966, 56 y.o.   MRN: [id number]  Chief Complaint  Patient presents with   Hand Pain    L hand pain patient states feels like bees are stinging her and swelling    HPI Patient is in today for complaint of left hand numbness and tingling for the last few weeks.  She had right hand numbness and tingling initially, but has been experiencing left hand symptoms now.  Denies any wrist pain currently.  She was given gabapentin in the past, which she has tried recently with some relief.  She has also tried taking Motrin.  Denies any recent injury.  She has history of CVA and seizure disorder, followed by Neurology.  Past Medical History:  Diagnosis Date   Acute respiratory failure with hypoxia ([place]) 10/18/2014   Asthma    Cholecystitis with cholelithiasis 07/12/2014   Cholelithiasis with acute cholecystitis 07/13/2014   DDD (degenerative disc disease), lumbar    Depression    HTN (hypertension)    Hx of scabies    Hyperlipidemia    Rectal bleed 11/13/2011   Seizures ([place])    remote   Stroke [facility]) 1990   chronic right hemiparesis    Past Surgical History:  Procedure Laterality Date   CESAREAN SECTION     CHOLECYSTECTOMY N/A 07/13/2014   Procedure: LAPAROSCOPIC CONVERTED TO OPEN  CHOLECYSTECTOMY ;  Surgeon: Armandina Gemma, MD;  Location: [facility];  Service: General;  Laterality: N/A;   COLONOSCOPY  12/24/2011   Procedure: COLONOSCOPY;  Surgeon: Danie Binder, MD;  [facility];  Service: Endoscopy;  Laterality: N/A;  2:00   GALLBLADDER SURGERY     [name] /[place]   PARTIAL HYSTERECTOMY     heavy bleeding    Family History  Problem Relation Age of Onset   Stroke Mother    Heart attack Mother    Stroke Father    Diabetes Other    Colon cancer Neg Hx    Liver disease Neg Hx     Social History   Socioeconomic History   Marital status: Divorced    Spouse name: Not on file    Number of children: 2   Years of education: Not on file   Highest education level: Not on file  Occupational History   Occupation: disabled    Employer: UNEMPLOYED  Tobacco Use   Smoking status: Never   Smokeless tobacco: Never  Vaping Use   Vaping Use: Never used  Substance and Sexual Activity   Alcohol use: Yes    Alcohol/week: 2.0 standard drinks of alcohol    Types: 2 Glasses of wine per week    Comment: couple of drinks on weekends   Drug use: No   Sexual activity: Not Currently  Other Topics Concern   Not on file  Social History Narrative   On disability. Used to work at [employer]/Worked at [employer] Retired now   [employer]    Two children.    Daughter: 70    Son: 4   Has 10 grand children.    Enjoys reading      Diet: eats lots of fruits and veggies, occasional meat: chicken mostly, likes pasta    Caffeine: Tea-green lipton   Water: about 5 cups daily      Wears seatbelt   Smoke detectors      Social Determinants of Health   Financial  Resource Strain: Low Risk  (05/30/2021)   Overall Financial Resource Strain (CARDIA)    Difficulty of Paying Living Expenses: Not hard at all  Food Insecurity: No Food Insecurity (05/30/2021)   Hunger Vital Sign    Worried About Running Out of Food in the Last Year: Never true    Ran Out of Food in the Last Year: Never true  Transportation Needs: Unmet Transportation Needs (05/30/2021)   PRAPARE - Transportation    Lack of Transportation (Medical): Yes    Lack of Transportation (Non-Medical): Yes  Physical Activity: Sufficiently Active (12/25/2020)   Exercise Vital Sign    Days of Exercise per Week: 5 days    Minutes of Exercise per Session: 30 min  Stress: Stress Concern Present (05/30/2021)   [employer] of Occupational Health - Occupational Stress Questionnaire    Feeling of Stress : Very much  Social Connections: Moderately Integrated (12/25/2020)   Social Connection and Isolation Panel [NHANES]    Frequency of  Communication with Friends and Family: More than three times a week    Frequency of Social Gatherings with Friends and Family: More than three times a week    Attends Religious Services: 1 to 4 times per year    Active Member of [employer] or Organizations: No    Attends Banker Meetings: 1 to 4 times per year    Marital Status: Divorced  Catering manager Violence: Not At Risk (12/25/2020)   Humiliation, Afraid, Rape, and Kick questionnaire    Fear of Current or Ex-Partner: No    Emotionally Abused: No    Physically Abused: No    Sexually Abused: No    Outpatient Medications Prior to Visit  Medication Sig Dispense Refill   aspirin EC 81 MG tablet Take 1 tablet (81 mg total) by mouth daily.     budesonide-formoterol (SYMBICORT) 80-4.5 MCG/ACT inhaler INHALE 2 PUFFS BY MOUTH EVERY 12 HOURS 10.2 g 2   dicyclomine (BENTYL) 10 MG capsule Take 1 capsule (10 mg total) by mouth 4 (four) times daily as needed for spasms. 30 capsule 1   divalproex (DEPAKOTE) 250 MG DR tablet Take 1 tablet (250 mg total) by mouth 3 (three) times daily. 270 tablet 4   FLUoxetine (PROZAC) 10 MG tablet TAKE 1 TABLET(10 MG) BY MOUTH DAILY 30 tablet 2   levalbuterol (XOPENEX HFA) 45 MCG/ACT inhaler INHALE 1 TO 2 PUFFS BY MOUTH EVERY 4 TO 6 HOURS AS NEEDED FOR WHEEZING 15 g 5   meclizine (ANTIVERT) 25 MG tablet Take 1 tablet (25 mg total) by mouth 3 (three) times daily as needed for dizziness. 30 tablet 0   Nebulizers (MY MDI PORTABLE NEBULISER) MISC 1 application by Does not apply route as needed. 1 each 0   ondansetron (ZOFRAN) 4 MG tablet Take 1 tablet (4 mg total) by mouth every 8 (eight) hours as needed for nausea or vomiting. 20 tablet 0   oxybutynin (DITROPAN-XL) 5 MG 24 hr tablet Take 5 mg by mouth daily.     pravastatin (PRAVACHOL) 20 MG tablet TAKE 1 TABLET(20 MG) BY MOUTH DAILY 90 tablet 2   UNABLE TO FIND 1 application by Other route daily. Wrist splint for carpal tunnel syndrome: Apply it daily. 1 each  0   verapamil (CALAN) 120 MG tablet TAKE 1 TABLET(120 MG) BY MOUTH DAILY 90 tablet 1   albuterol (ACCUNEB) 0.63 MG/3ML nebulizer solution Take 3 mLs (0.63 mg total) by nebulization every 6 (six) hours as needed for wheezing or shortness  of breath. 3 mL 3   No facility-administered medications prior to visit.    Allergies  Allergen Reactions   Bee Venom Swelling    Review of Systems  Constitutional:  Positive for fatigue. Negative for chills and fever.  HENT:  Negative for congestion, sinus pressure, sinus pain and sore throat.   Eyes:  Negative for pain and discharge.  Respiratory:  Negative for cough and shortness of breath.   Cardiovascular:  Negative for chest pain and palpitations.  Gastrointestinal:  Negative for abdominal pain, diarrhea, nausea and vomiting.  Endocrine: Negative for polydipsia and polyuria.  Genitourinary:  Negative for dysuria and hematuria.  Musculoskeletal:  Negative for neck pain and neck stiffness.  Skin:  Negative for rash.  Neurological:  Positive for numbness. Negative for dizziness and weakness.  Psychiatric/Behavioral:  Positive for sleep disturbance. Negative for agitation and behavioral problems.        Objective:    Physical Exam Vitals reviewed.  Constitutional:      General: She is not in acute distress.    Appearance: She is obese. She is not diaphoretic.  HENT:     Head: Normocephalic and atraumatic.     Mouth/Throat:     Mouth: Mucous membranes are moist.     Pharynx: No posterior oropharyngeal erythema.  Eyes:     General: Vision grossly intact. No scleral icterus.       Right eye: No foreign body.        Left eye: No foreign body.     Extraocular Movements: Extraocular movements intact.     Conjunctiva/sclera:     Right eye: No chemosis.    Left eye: No chemosis. Cardiovascular:     Rate and Rhythm: Normal rate and regular rhythm.     Pulses: Normal pulses.     Heart sounds: Normal heart sounds. No murmur heard. Pulmonary:      Breath sounds: No wheezing or rales.  Musculoskeletal:     Cervical back: Neck supple. No tenderness.     Right lower leg: No edema.     Left lower leg: No edema.     Comments: Phalen test negative  Skin:    General: Skin is warm.     Findings: No rash.  Neurological:     General: No focal deficit present.     Mental Status: She is alert and oriented to person, place, and time.     Motor: Weakness (RUE - 4/5, RLE - 4/5) present.  Psychiatric:        Mood and Affect: Mood normal.        Behavior: Behavior normal.     BP 120/79 (BP Location: Left Arm, Patient Position: Sitting, Cuff Size: Large)   Pulse 68   Ht 5\' 2"  (1.575 m)   Wt 196 lb 6.4 oz (89.1 kg)   SpO2 94%   BMI 35.92 kg/m  Wt Readings from Last 3 Encounters:  02/05/22 196 lb 6.4 oz (89.1 kg)  01/01/22 188 lb 3.2 oz (85.4 kg)  12/20/21 197 lb (89.4 kg)        Assessment & Plan:   Problem List Items Addressed This Visit       Respiratory   Moderate persistent asthma    Continue Symbicort and as needed Xopenex She has better response with albuterol nebulizer, prescription sent       Relevant Medications   albuterol (ACCUNEB) 0.63 MG/3ML nebulizer solution     Nervous and Auditory   Peripheral neuropathy  Advised to take Gabapentin Wrist splint      Relevant Medications   gabapentin (NEURONTIN) 100 MG capsule   Carpal tunnel syndrome, bilateral - Primary    Her hands symptoms could be due to carpal tunnel syndrome Advised to use wrist brace at nighttime Gabapentin 100 mg twice daily Referred to orthopedic surgery       Relevant Medications   gabapentin (NEURONTIN) 100 MG capsule   Other Relevant Orders   Ambulatory referral to Orthopedic Surgery     Meds ordered this encounter  Medications   albuterol (ACCUNEB) 0.63 MG/3ML nebulizer solution    Sig: Take 3 mLs (0.63 mg total) by nebulization every 6 (six) hours as needed for wheezing or shortness of breath.    Dispense:  3 mL     Refill:  3    Please dispense nebulizer machine.   gabapentin (NEURONTIN) 100 MG capsule    Sig: Take 1 capsule (100 mg total) by mouth 2 (two) times daily.    Dispense:  60 capsule    Refill:  3     Zhion Pevehouse Concha Se, MD

## 2022-02-05 NOTE — Assessment & Plan Note (Addendum)
Advised to take Gabapentin Wrist splint

## 2022-02-05 NOTE — Assessment & Plan Note (Signed)
Continue Symbicort and as needed Xopenex She has better response with albuterol nebulizer, prescription sent

## 2022-02-05 NOTE — Patient Instructions (Addendum)
Please take Gabapentin for numbness and tingling.  Okay to take Tylenol arthritis for wrist pain.  Please wear wrist brace at nighttime.

## 2022-02-14 ENCOUNTER — Ambulatory Visit: Payer: 59 | Admitting: Orthopedic Surgery

## 2022-02-14 NOTE — Progress Notes (Deleted)
NEW PROBLEM//OFFICE VISIT   No chief complaint on file.  HPI 56 year old female seen by her primary care doctor for numbness and tingling of her fingers.  She was placed on medication and told to wear carpal tunnel splint  Patient is in today for complaint of left hand numbness and tingling for the last few weeks.  She had right hand numbness and tingling initially, but has been experiencing left hand symptoms now.  Denies any wrist pain currently.  She was given gabapentin in the past, which she has tried recently with some relief.  She has also tried taking Motrin.  Denies any recent injury.  She has history of CVA and seizure disorder, followed by Neurology.   ROS: ***  Allergies  Allergen Reactions   Bee Venom Swelling    Current Outpatient Medications  Medication Instructions   albuterol (ACCUNEB) 0.63 mg, Nebulization, Every 6 hours PRN   aspirin EC 81 mg, Oral, Daily   budesonide-formoterol (SYMBICORT) 80-4.5 MCG/ACT inhaler INHALE 2 PUFFS BY MOUTH EVERY 12 HOURS   dicyclomine (BENTYL) 10 mg, Oral, 4 times daily PRN   divalproex (DEPAKOTE) 250 mg, Oral, 3 times daily   FLUoxetine (PROZAC) 10 MG tablet TAKE 1 TABLET(10 MG) BY MOUTH DAILY   gabapentin (NEURONTIN) 100 mg, Oral, 2 times daily   levalbuterol (XOPENEX HFA) 45 MCG/ACT inhaler INHALE 1 TO 2 PUFFS BY MOUTH EVERY 4 TO 6 HOURS AS NEEDED FOR WHEEZING   meclizine (ANTIVERT) 25 mg, Oral, 3 times daily PRN   Nebulizers (MY MDI PORTABLE NEBULISER) MISC 1 application , Does not apply, As needed   ondansetron (ZOFRAN) 4 mg, Oral, Every 8 hours PRN   oxybutynin (DITROPAN-XL) 5 mg, Oral, Daily   pravastatin (PRAVACHOL) 20 MG tablet TAKE 1 TABLET(20 MG) BY MOUTH DAILY   UNABLE TO FIND 1 application , Other, Daily, Wrist splint for carpal tunnel syndrome: Apply it daily.   verapamil (CALAN) 120 MG tablet TAKE 1 TABLET(120 MG) BY MOUTH DAILY    ROS   There were no vitals taken for this visit.  There is no height or weight on  file to calculate BMI.  General appearance: Well-developed well-nourished no gross deformities  Cardiovascular normal pulse and perfusion normal color without edema  Neurologically no sensation loss or deficits or pathologic reflexes  Psychological: Awake alert and oriented x3 mood and affect normal  Skin no lacerations or ulcerations no nodularity no palpable masses, no erythema or nodularity  Musculoskeletal: ***      Past Medical History:  Diagnosis Date   Acute respiratory failure with hypoxia (HCC) 10/18/2014   Asthma    Cholecystitis with cholelithiasis 07/12/2014   Cholelithiasis with acute cholecystitis 07/13/2014   DDD (degenerative disc disease), lumbar    Depression    HTN (hypertension)    Hx of scabies    Hyperlipidemia    Rectal bleed 11/13/2011   Seizures (HCC)    remote   Stroke [facility]) 1990   chronic right hemiparesis    Past Surgical History:  Procedure Laterality Date   CESAREAN SECTION     CHOLECYSTECTOMY N/A 07/13/2014   Procedure: LAPAROSCOPIC CONVERTED TO OPEN  CHOLECYSTECTOMY ;  Surgeon: Darnell Level, MD;  Location[facility];  Service: General;  Laterality: N/A;   COLONOSCOPY  12/24/2011   Procedure: COLONOSCOPY;  Surgeon: West Bali, MD;  [facility];  Service: Endoscopy;  Laterality: N/A;  2:00   GALLBLADDER SURGERY     [name] /[place]   PARTIAL HYSTERECTOMY  heavy bleeding    Family History  Problem Relation Age of Onset   Stroke Mother    Heart attack Mother    Stroke Father    Diabetes Other    Colon cancer Neg Hx    Liver disease Neg Hx    Social History   Tobacco Use   Smoking status: Never   Smokeless tobacco: Never  Vaping Use   Vaping Use: Never used  Substance Use Topics   Alcohol use: Yes    Alcohol/week: 2.0 standard drinks of alcohol    Types: 2 Glasses of wine per week    Comment: couple of drinks on weekends   Drug use: No    Allergies  Allergen Reactions   Bee Venom Swelling     No outpatient medications have been marked as taking for the 02/14/22 encounter (Appointment) with Vickki Hearing, MD.     MEDICAL DECISION MAKING  A. No diagnosis found.  B. DATA ANALYSED:   IMAGING: Interpretation of images: I have personally reviewed the images and my interpretation is *** Orders: ***  Outside records reviewed: ***   C. MANAGEMENT   ***  No orders of the defined types were placed in this encounter.    Fuller Canada, MD  02/14/2022 8:45 AM

## 2022-02-15 ENCOUNTER — Telehealth: Payer: Self-pay | Admitting: Internal Medicine

## 2022-02-15 ENCOUNTER — Other Ambulatory Visit: Payer: Self-pay

## 2022-02-15 DIAGNOSIS — J4540 Moderate persistent asthma, uncomplicated: Secondary | ICD-10-CM

## 2022-02-15 MED ORDER — LEVALBUTEROL TARTRATE 45 MCG/ACT IN AERO: INHALATION_SPRAY | RESPIRATORY_TRACT | 5 refills | Status: DC

## 2022-02-15 NOTE — Telephone Encounter (Signed)
Patel patient    Pt called wanting to know if she can take her depression medication 2x a day?     FLUoxetine (PROZAC) 10 MG tablet    Also needs refill on levalbuterol (XOPENEX HFA) 45 MCG/ACT inhaler .    [facility]

## 2022-02-25 NOTE — Telephone Encounter (Signed)
scheduled

## 2022-02-27 ENCOUNTER — Encounter: Payer: Self-pay | Admitting: Internal Medicine

## 2022-02-27 ENCOUNTER — Ambulatory Visit (INDEPENDENT_AMBULATORY_CARE_PROVIDER_SITE_OTHER): Payer: 59 | Admitting: Internal Medicine

## 2022-02-27 DIAGNOSIS — F339 Major depressive disorder, recurrent, unspecified: Secondary | ICD-10-CM | POA: Diagnosis not present

## 2022-02-27 DIAGNOSIS — F321 Major depressive disorder, single episode, moderate: Secondary | ICD-10-CM | POA: Diagnosis not present

## 2022-02-27 MED ORDER — FLUOXETINE HCL 20 MG PO TABS: 20.0000 mg | ORAL_TABLET | Freq: Every day | ORAL | 5 refills | Status: DC

## 2022-02-27 NOTE — Patient Instructions (Signed)
Please start taking Prozac 20 mg daily instead of 10 mg.

## 2022-02-27 NOTE — Assessment & Plan Note (Addendum)
Was uncontrolled with Lexapro in the past Had titrated Lexapro and switched to Prozac Uncontrolled due to stress at home, increased dose of Prozac to 20 mg QD

## 2022-02-27 NOTE — Progress Notes (Addendum)
Virtual Visit via Telephone Note   This visit type was conducted via telephone. This format is felt to be most appropriate for this patient at this time.  The patient did not have access to video technology/had technical difficulties with video requiring transitioning to audio format only (telephone).  All issues noted in this document were discussed and addressed.  No physical exam could be performed with this format.  Evaluation Performed:  Follow-up visit  Date:  05/15/2022   ID:  Patty [name], DOB [birth year], MRN [id number]  Patient Location: Home Provider Location: Office/Clinic  Participants: Patient Location of Patient: Home Location of Provider: Telehealth Consent was obtain for visit to be over via telehealth. I verified that I am speaking with the correct person using two identifiers.  PCP:  Lindell Spar, MD   Chief Complaint: Depression  History of Present Illness:    Patty [name] is a 56 y.o. female who has a televisit for complaint of depressed mood despite taking Prozac.  She has been stressed at home.  She also reports spells of anxiety, insomnia and apathy.  She has tried Lexapro in the past.  She denies any SI or HI currently.  The patient does not have symptoms concerning for COVID-19 infection (fever, chills, cough, or new shortness of breath).   Past Medical, Surgical, Social History, Allergies, and Medications have been Reviewed.  Past Medical History:  Diagnosis Date   Acute respiratory failure with hypoxia ([place]) 10/18/2014   Asthma    Cholecystitis with cholelithiasis 07/12/2014   Cholelithiasis with acute cholecystitis 07/13/2014   DDD (degenerative disc disease), lumbar    Depression    HTN (hypertension)    Hx of scabies    Hyperlipidemia    Rectal bleed 11/13/2011   Seizures ([place])    remote   Stroke [facility]) 1990   chronic right hemiparesis   Past Surgical History:  Procedure Laterality Date   CESAREAN SECTION     CHOLECYSTECTOMY N/A  07/13/2014   Procedure: LAPAROSCOPIC CONVERTED TO OPEN  CHOLECYSTECTOMY ;  Surgeon: Armandina Gemma, MD;  [facility];  Service: General;  Laterality: N/A;   COLONOSCOPY  12/24/2011   Procedure: COLONOSCOPY;  Surgeon: Danie Binder, MD;  [facility];  Service: Endoscopy;  Laterality: N/A;  2:00   GALLBLADDER SURGERY     [name] /[place]   PARTIAL HYSTERECTOMY     heavy bleeding     No outpatient medications have been marked as taking for the 02/27/22 encounter (Office Visit) with Lindell Spar, MD.     Allergies:   Bee venom   ROS:   Please see the history of present illness.     All other systems reviewed and are negative.   Labs/Other Tests and Data Reviewed:    Recent Labs: 08/16/2021: BUN 17; Creatinine, Ser 0.85; Hemoglobin 14.2; Platelets 244; Potassium 4.7; Sodium 141   Recent Lipid Panel Lab Results  Component Value Date/Time   CHOL 192 08/16/2021 04:40 PM   TRIG 166 (H) 08/16/2021 04:40 PM   HDL 51 08/16/2021 04:40 PM   CHOLHDL 3.8 08/16/2021 04:40 PM   CHOLHDL 3.6 09/09/2018 07:50 AM   LDLCALC 112 (H) 08/16/2021 04:40 PM   LDLCALC 101 (H) 09/09/2018 07:50 AM    Wt Readings from Last 3 Encounters:  04/29/22 195 lb (88.5 kg)  02/05/22 196 lb 6.4 oz (89.1 kg)  01/01/22 188 lb 3.2 oz (85.4 kg)     ASSESSMENT & PLAN:  Depression, recurrent ([place]) Was uncontrolled with Lexapro in the past Had titrated Lexapro and switched to Prozac Uncontrolled due to stress at home, increased dose of Prozac to 20 mg QD   Time:   Today, I have spent 7 minutes reviewing the chart, including problem list, medications, and with the patient with telehealth technology discussing the above problems.   Medication Adjustments/Labs and Tests Ordered: Current medicines are reviewed at length with the patient today.  Concerns regarding medicines are outlined above.   Tests Ordered: No orders of the defined types were placed in this encounter.   Medication  Changes: Meds ordered this encounter  Medications   FLUoxetine (PROZAC) 20 MG tablet    Sig: Take 1 tablet (20 mg total) by mouth daily.    Dispense:  30 tablet    Refill:  5     Note: This dictation was prepared with Dragon dictation along with smaller phrase technology. Similar sounding words can be transcribed inadequately or may not be corrected upon review. Any transcriptional errors that result from this process are unintentional.      Disposition:  Follow up  Signed, Lindell Spar, MD  05/15/2022 10:47 AM     [place]

## 2022-03-04 ENCOUNTER — Telehealth: Payer: Self-pay | Admitting: Orthopedic Surgery

## 2022-03-04 NOTE — Telephone Encounter (Signed)
Tried to return the patient's call twice and couldn't get her.  Not able to leave a message, mailbox is full, does not have mychart..  She is requesting an appointment with Dr. Romeo Apple.

## 2022-03-19 ENCOUNTER — Other Ambulatory Visit (HOSPITAL_COMMUNITY): Payer: Self-pay | Admitting: Internal Medicine

## 2022-03-19 DIAGNOSIS — Z1231 Encounter for screening mammogram for malignant neoplasm of breast: Secondary | ICD-10-CM

## 2022-03-21 ENCOUNTER — Ambulatory Visit (HOSPITAL_COMMUNITY)
Admission: RE | Admit: 2022-03-21 | Discharge: 2022-03-21 | Disposition: A | Discharge status: Home / Self Care | Payer: 59 | Source: Ambulatory Visit | Attending: Internal Medicine | Admitting: Internal Medicine

## 2022-03-21 DIAGNOSIS — Z1231 Encounter for screening mammogram for malignant neoplasm of breast: Secondary | ICD-10-CM | POA: Diagnosis present

## 2022-03-26 ENCOUNTER — Encounter (HOSPITAL_COMMUNITY): Payer: Self-pay | Admitting: Internal Medicine

## 2022-04-26 ENCOUNTER — Ambulatory Visit: Payer: Medicare Other | Admitting: Internal Medicine

## 2022-04-29 ENCOUNTER — Ambulatory Visit (INDEPENDENT_AMBULATORY_CARE_PROVIDER_SITE_OTHER): Payer: 59 | Admitting: Orthopedic Surgery

## 2022-04-29 ENCOUNTER — Encounter: Payer: Self-pay | Admitting: Orthopedic Surgery

## 2022-04-29 VITALS — BP 109/70 | HR 84 | Ht 62.0 in | Wt 195.0 lb

## 2022-04-29 DIAGNOSIS — G5602 Carpal tunnel syndrome, left upper limb: Secondary | ICD-10-CM | POA: Diagnosis not present

## 2022-04-29 NOTE — Progress Notes (Signed)
Chief Complaint  Patient presents with   Hand Pain    Left hand pain    58 year old female history of carpal tunnel syndrome treated with bracing and gabapentin no improvement complains of severe pain with tingling numbness weakness in the left upper extremity.  Reexamination today shows a positive Phalen's test at 40 seconds tenderness over the carpal tunnel with positive compression test decree sensation in the median nerve distribution weak grip  Recommend nerve conduction study and if positive surgical release

## 2022-04-29 NOTE — Addendum Note (Signed)
Addended by: Obie Dredge A on: 04/29/2022 11:23 AM   Modules accepted: Orders

## 2022-04-29 NOTE — Patient Instructions (Signed)
You have been referred to see Dr. Blanche [facility] [place] PHONE: [phone] Vito Backers is their referral coordinator and she will call you to schedule your initial visit. If you haven't heard from that office in about 1 week, please call them and schedule your appointment.

## 2022-05-03 ENCOUNTER — Telehealth: Payer: Self-pay | Admitting: Physical Medicine and Rehabilitation

## 2022-05-03 NOTE — Telephone Encounter (Signed)
Patient called. Returning a call to schedule with Dr. Newton.  ?

## 2022-05-07 ENCOUNTER — Telehealth: Payer: Self-pay | Admitting: Physical Medicine and Rehabilitation

## 2022-05-07 NOTE — Telephone Encounter (Signed)
Spoke with patient and scheduled NCV for 05/22/22

## 2022-05-07 NOTE — Telephone Encounter (Signed)
Patient states her doctor told her to have a nerve conduction test ( Dr. Anselm Jungling office), I don't see a referral and she want an appointment. Patient has speech delays due to CVA history.

## 2022-05-07 NOTE — Telephone Encounter (Signed)
Attempted to call patient but mailbox full.

## 2022-05-15 ENCOUNTER — Encounter: Payer: Self-pay | Admitting: Internal Medicine

## 2022-05-15 ENCOUNTER — Ambulatory Visit (INDEPENDENT_AMBULATORY_CARE_PROVIDER_SITE_OTHER): Payer: 59 | Admitting: Internal Medicine

## 2022-05-15 VITALS — BP 111/63 | HR 90 | Ht 62.0 in | Wt 197.8 lb

## 2022-05-15 DIAGNOSIS — G4452 New daily persistent headache (NDPH): Secondary | ICD-10-CM | POA: Diagnosis not present

## 2022-05-15 DIAGNOSIS — I69359 Hemiplegia and hemiparesis following cerebral infarction affecting unspecified side: Secondary | ICD-10-CM

## 2022-05-15 DIAGNOSIS — G40909 Epilepsy, unspecified, not intractable, without status epilepticus: Secondary | ICD-10-CM | POA: Diagnosis not present

## 2022-05-15 DIAGNOSIS — I10 Essential (primary) hypertension: Secondary | ICD-10-CM

## 2022-05-15 DIAGNOSIS — E782 Mixed hyperlipidemia: Secondary | ICD-10-CM

## 2022-05-15 DIAGNOSIS — F339 Major depressive disorder, recurrent, unspecified: Secondary | ICD-10-CM

## 2022-05-15 NOTE — Progress Notes (Signed)
Established Patient Office Visit  Subjective:  Patient ID: Patty [name], female    DOB: [birth year]  Age: 57 y.o. MRN: [id number]  CC:  Chief Complaint  Patient presents with   Headache    Patient is experiencing severe headaches on the right side of her head    HPI Patty [name] is a 57 y.o. female with past medical history of HTN, CVA, OSA, asthma, seizure disorder, HLD, depression and morbid obesity who presents for f/u of her chronic medical conditions.  She c/o severe right sided headache, almost or not daily basis, lasting for few hours.  Her pain is sharp, nonradiating and has mild photo phobia.  Denies any nausea or vomiting.  Denies any new visual disturbance or focal neurologic deficits such as numbness or tingling of face. She has chronic numbness and weakness of right extremities. Of note, she had been  stressed at home, but her anxiety has improved recently with Prozac.  HTN: BP is well-controlled. Takes medications regularly. Patient denies dizziness, chest pain, dyspnea or palpitations.  She has history of CVA and seizure disorder, followed by neurology.  She takes aspirin and statin for history of CVA.  She takes Depakote for seizures.  Denies any recent seizure-like activity.  She takes Prozac for h/o depression.  She denies any anhedonia, SI or HI.    Past Medical History:  Diagnosis Date   Acute respiratory failure with hypoxia ([place]) 10/18/2014   Asthma    Cholecystitis with cholelithiasis 07/12/2014   Cholelithiasis with acute cholecystitis 07/13/2014   DDD (degenerative disc disease), lumbar    Depression    HTN (hypertension)    Hx of scabies    Hyperlipidemia    Rectal bleed 11/13/2011   Seizures ([place])    remote   Stroke [facility]) 1990   chronic right hemiparesis    Past Surgical History:  Procedure Laterality Date   CESAREAN SECTION     CHOLECYSTECTOMY N/A 07/13/2014   Procedure: LAPAROSCOPIC CONVERTED TO OPEN  CHOLECYSTECTOMY ;  Surgeon: Armandina Gemma,  MD;  [facility];  Service: General;  Laterality: N/A;   COLONOSCOPY  12/24/2011   Procedure: COLONOSCOPY;  Surgeon: Danie Binder, MD;  [facility];  Service: Endoscopy;  Laterality: N/A;  2:00   GALLBLADDER SURGERY     [name] /[place]   PARTIAL HYSTERECTOMY     heavy bleeding    Family History  Problem Relation Age of Onset   Stroke Mother    Heart attack Mother    Stroke Father    Diabetes Other    Colon cancer Neg Hx    Liver disease Neg Hx     Social History   Socioeconomic History   Marital status: Divorced    Spouse name: Not on file   Number of children: 2   Years of education: Not on file   Highest education level: Not on file  Occupational History   Occupation: disabled    Employer: UNEMPLOYED  Tobacco Use   Smoking status: Never   Smokeless tobacco: Never  Vaping Use   Vaping Use: Never used  Substance and Sexual Activity   Alcohol use: Yes    Alcohol/week: 2.0 standard drinks of alcohol    Types: 2 Glasses of wine per week    Comment: couple of drinks on weekends   Drug use: No   Sexual activity: Not Currently  Other Topics Concern   Not on file  Social History Narrative   On  disability. Used to work at [employer]/Worked at [employer] Retired now   [employer]    Two children.    Daughter: 61    Son: 77   Has 10 grand children.    Enjoys reading      Diet: eats lots of fruits and veggies, occasional meat: chicken mostly, likes pasta    Caffeine: Tea-green lipton   Water: about 5 cups daily      Wears seatbelt   Smoke detectors      Social Determinants of Health   Financial Resource Strain: Low Risk  (05/30/2021)   Overall Financial Resource Strain (CARDIA)    Difficulty of Paying Living Expenses: Not hard at all  Food Insecurity: No Food Insecurity (05/30/2021)   Hunger Vital Sign    Worried About Running Out of Food in the Last Year: Never true    [place] in the Last Year: Never true  Transportation  Needs: Unmet Transportation Needs (05/30/2021)   PRAPARE - Transportation    Lack of Transportation (Medical): Yes    Lack of Transportation (Non-Medical): Yes  Physical Activity: Sufficiently Active (12/25/2020)   Exercise Vital Sign    Days of Exercise per Week: 5 days    Minutes of Exercise per Session: 30 min  Stress: Stress Concern Present (05/30/2021)   [place]    Feeling of Stress : Very much  Social Connections: Moderately Integrated (12/25/2020)   Social Connection and Isolation Panel [NHANES]    Frequency of Communication with Friends and Family: More than three times a week    Frequency of Social Gatherings with Friends and Family: More than three times a week    Attends Religious Services: 1 to 4 times per year    Active Member of [employer] or Organizations: No    Attends Archivist Meetings: 1 to 4 times per year    Marital Status: Divorced  Human resources officer Violence: Not At Risk (12/25/2020)   Humiliation, Afraid, Rape, and Kick questionnaire    Fear of Current or Ex-Partner: No    Emotionally Abused: No    Physically Abused: No    Sexually Abused: No    Outpatient Medications Prior to Visit  Medication Sig Dispense Refill   albuterol (ACCUNEB) 0.63 MG/3ML nebulizer solution Take 3 mLs (0.63 mg total) by nebulization every 6 (six) hours as needed for wheezing or shortness of breath. 3 mL 3   aspirin EC 81 MG tablet Take 1 tablet (81 mg total) by mouth daily.     budesonide-formoterol (SYMBICORT) 80-4.5 MCG/ACT inhaler INHALE 2 PUFFS BY MOUTH EVERY 12 HOURS 10.2 g 2   dicyclomine (BENTYL) 10 MG capsule Take 1 capsule (10 mg total) by mouth 4 (four) times daily as needed for spasms. 30 capsule 1   divalproex (DEPAKOTE) 250 MG DR tablet Take 1 tablet (250 mg total) by mouth 3 (three) times daily. 270 tablet 4   FLUoxetine (PROZAC) 20 MG tablet Take 1 tablet (20 mg total) by mouth daily. 30 tablet 5    gabapentin (NEURONTIN) 100 MG capsule Take 1 capsule (100 mg total) by mouth 2 (two) times daily. 60 capsule 3   levalbuterol (XOPENEX HFA) 45 MCG/ACT inhaler INHALE 1 TO 2 PUFFS BY MOUTH EVERY 4 TO 6 HOURS AS NEEDED FOR WHEEZING 15 g 5   meclizine (ANTIVERT) 25 MG tablet Take 1 tablet (25 mg total) by mouth 3 (three) times daily as needed for dizziness. [place]  tablet 0   Nebulizers (MY MDI PORTABLE NEBULISER) MISC 1 application by Does not apply route as needed. 1 each 0   ondansetron (ZOFRAN) 4 MG tablet Take 1 tablet (4 mg total) by mouth every 8 (eight) hours as needed for nausea or vomiting. 20 tablet 0   oxybutynin (DITROPAN-XL) 5 MG 24 hr tablet Take 5 mg by mouth daily.     pravastatin (PRAVACHOL) 20 MG tablet TAKE 1 TABLET(20 MG) BY MOUTH DAILY 90 tablet 2   UNABLE TO FIND 1 application by Other route daily. Wrist splint for carpal tunnel syndrome: Apply it daily. 1 each 0   verapamil (CALAN) 120 MG tablet TAKE 1 TABLET(120 MG) BY MOUTH DAILY 90 tablet 1   No facility-administered medications prior to visit.    Allergies  Allergen Reactions   Bee Venom Swelling    ROS Review of Systems  Constitutional:  Positive for fatigue. Negative for chills and fever.  HENT:  Negative for congestion, sinus pressure, sinus pain and sore throat.   Eyes:  Negative for pain and discharge.  Respiratory:  Negative for cough and shortness of breath.   Cardiovascular:  Negative for chest pain and palpitations.  Gastrointestinal:  Negative for abdominal pain, diarrhea, nausea and vomiting.  Endocrine: Negative for polydipsia and polyuria.  Genitourinary:  Negative for dysuria and hematuria.  Musculoskeletal:  Negative for neck pain and neck stiffness.  Skin:  Negative for rash.  Neurological:  Positive for headaches. Negative for dizziness and weakness.  Psychiatric/Behavioral:  Positive for sleep disturbance. Negative for agitation and behavioral problems.       Objective:    Physical  Exam Vitals reviewed.  Constitutional:      General: She is not in acute distress.    Appearance: She is obese. She is not diaphoretic.  HENT:     Head: Normocephalic and atraumatic.     Nose: Nose normal. No congestion.     Mouth/Throat:     Mouth: Mucous membranes are moist.     Pharynx: No posterior oropharyngeal erythema.  Eyes:     General: No scleral icterus.    Extraocular Movements: Extraocular movements intact.  Cardiovascular:     Rate and Rhythm: Normal rate and regular rhythm.     Pulses: Normal pulses.     Heart sounds: Normal heart sounds. No murmur heard. Pulmonary:     Breath sounds: No wheezing or rales.  Musculoskeletal:     Cervical back: Neck supple. No tenderness.     Right lower leg: No edema.     Left lower leg: No edema.  Skin:    General: Skin is warm.     Findings: No rash.  Neurological:     General: No focal deficit present.     Mental Status: She is alert and oriented to person, place, and time.     Motor: Weakness (RUE - 4/5, RLE - 4/5) present.  Psychiatric:        Mood and Affect: Mood normal.        Behavior: Behavior normal.     BP 111/63 (BP Location: Right Arm, Patient Position: Sitting, Cuff Size: Large)   Pulse 90   Ht 5' 2"$  (1.575 m)   Wt 197 lb 12.8 oz (89.7 kg)   SpO2 94%   BMI 36.18 kg/m  Wt Readings from Last 3 Encounters:  05/15/22 197 lb 12.8 oz (89.7 kg)  04/29/22 195 lb (88.5 kg)  02/05/22 196 lb 6.4 oz (89.1 kg)  Lab Results  Component Value Date   TSH 1.840 09/21/2020   Lab Results  Component Value Date   WBC 5.9 08/16/2021   HGB 14.2 08/16/2021   HCT 41.6 08/16/2021   MCV 91 08/16/2021   PLT 244 08/16/2021   Lab Results  Component Value Date   NA 141 08/16/2021   K 4.7 08/16/2021   CO2 25 08/16/2021   GLUCOSE 89 08/16/2021   BUN 17 08/16/2021   CREATININE 0.85 08/16/2021   BILITOT <0.2 09/21/2020   ALKPHOS 97 09/21/2020   AST 13 09/21/2020   ALT 18 09/21/2020   PROT 6.7 09/21/2020   ALBUMIN  4.1 09/21/2020   CALCIUM 9.4 08/16/2021   ANIONGAP 7 03/07/2016   EGFR 80 08/16/2021   Lab Results  Component Value Date   CHOL 192 08/16/2021   Lab Results  Component Value Date   HDL 51 08/16/2021   Lab Results  Component Value Date   LDLCALC 112 (H) 08/16/2021   Lab Results  Component Value Date   TRIG 166 (H) 08/16/2021   Lab Results  Component Value Date   CHOLHDL 3.8 08/16/2021   Lab Results  Component Value Date   HGBA1C 5.9 (H) 01/02/2021      Assessment & Plan:   Problem List Items Addressed This Visit       Cardiovascular and Mediastinum   Essential hypertension    BP Readings from Last 1 Encounters:  05/15/22 111/63  Well-controlled with Verapamil Counseled for compliance with the medications Advised DASH diet and moderate exercise/walking, at least 150 mins/week        Nervous and Auditory   Seizure disorder ([place])    Takes Depakote Followed by neurology      Relevant Orders   MR Brain Wo Contrast   CVA, old, hemiparesis ([place])    With right hemiparesis On aspirin and statin      Relevant Orders   MR Brain Wo Contrast     Other   Mixed hyperlipidemia    On statin      Morbid obesity due to excess calories (HCC)    Diet modification and moderate exercise/walking advised      Depression, recurrent ([place])    Has tried Lexapro in the past Had titrated Lexapro and switched to Prozac Was uncontrolled due to stress at home, increased dose of Prozac to 20 mg QD recently - now better      New daily persistent headache - Primary    Unclear etiology, but considering her history of CVA and seizure disorder, would prefer to get MRI of brain to rule out acute etiology She has tried Aleve with mild relief, but advised to avoid daily intake Trial of Tylenol and Excedrin Migraine alternatively She is on Depakote and Verapamil currently, which have ppx role in migraine as well      Relevant Orders   MR Brain Wo Contrast   No orders of the  defined types were placed in this encounter.   Follow-up: Return in about 3 weeks (around 06/05/2022) for Headache.    Lindell Spar, MD

## 2022-05-15 NOTE — Patient Instructions (Addendum)
Please try taking Tylenol or Excedrin Migraine instead of Aleve for headache.  Please continue taking other medications as prescribed.  You are being scheduled to get MRI brain.

## 2022-05-15 NOTE — Assessment & Plan Note (Signed)
Takes Depakote Followed by neurology

## 2022-05-15 NOTE — Assessment & Plan Note (Signed)
BP Readings from Last 1 Encounters:  05/15/22 111/63   Well-controlled with Verapamil Counseled for compliance with the medications Advised DASH diet and moderate exercise/walking, at least 150 mins/week

## 2022-05-15 NOTE — Assessment & Plan Note (Signed)
With right hemiparesis On aspirin and statin

## 2022-05-15 NOTE — Assessment & Plan Note (Signed)
Unclear etiology, but considering her history of CVA and seizure disorder, would prefer to get MRI of brain to rule out acute etiology She has tried Aleve with mild relief, but advised to avoid daily intake Trial of Tylenol and Excedrin Migraine alternatively She is on Depakote and Verapamil currently, which have ppx role in migraine as well

## 2022-05-15 NOTE — Assessment & Plan Note (Signed)
On statin.

## 2022-05-15 NOTE — Assessment & Plan Note (Signed)
Diet modification and moderate exercise/walking advised 

## 2022-05-15 NOTE — Assessment & Plan Note (Addendum)
Has tried Lexapro in the past Had titrated Lexapro and switched to Prozac Was uncontrolled due to stress at home, increased dose of Prozac to 20 mg QD recently - now better

## 2022-05-21 ENCOUNTER — Other Ambulatory Visit: Payer: Self-pay | Admitting: Internal Medicine

## 2022-05-21 DIAGNOSIS — J4540 Moderate persistent asthma, uncomplicated: Secondary | ICD-10-CM

## 2022-05-22 ENCOUNTER — Encounter: Payer: 59 | Admitting: Physical Medicine and Rehabilitation

## 2022-06-05 ENCOUNTER — Encounter: Payer: Self-pay | Admitting: Internal Medicine

## 2022-06-05 ENCOUNTER — Ambulatory Visit (INDEPENDENT_AMBULATORY_CARE_PROVIDER_SITE_OTHER): Payer: 59 | Admitting: Internal Medicine

## 2022-06-05 VITALS — BP 116/72 | HR 113 | Ht 62.0 in | Wt 192.6 lb

## 2022-06-05 DIAGNOSIS — G43719 Chronic migraine without aura, intractable, without status migrainosus: Secondary | ICD-10-CM

## 2022-06-05 DIAGNOSIS — F339 Major depressive disorder, recurrent, unspecified: Secondary | ICD-10-CM | POA: Diagnosis not present

## 2022-06-05 DIAGNOSIS — I10 Essential (primary) hypertension: Secondary | ICD-10-CM

## 2022-06-05 MED ORDER — NURTEC 75 MG PO TBDP: 1.0000 | ORAL_TABLET | ORAL | 0 refills | Status: AC

## 2022-06-05 NOTE — Assessment & Plan Note (Signed)
Has tried Lexapro in the past Had titrated Lexapro and switched to Prozac Was uncontrolled due to stress at home, increased dose of Prozac to 20 mg QD recently - now better

## 2022-06-05 NOTE — Assessment & Plan Note (Addendum)
Considering her history of CVA and seizure disorder, would prefer to get MRI of brain to rule out acute etiology She has tried Aleve with mild relief, but advised to avoid daily intake Trial of Tylenol and Excedrin Migraine alternatively Less likely to be GCA as she does not have temporal area tenderness or vision change, and headache is intermittent She is on Depakote and Verapamil currently, which have ppx role in migraine as well Added Nurtec, sample provided - unable to take triptans due to h/o CVA

## 2022-06-05 NOTE — Progress Notes (Addendum)
Established Patient Office Visit  Subjective:  Patient ID: Patty [name], female    DOB: 07-02-1965  Age: 57 y.o. MRN: [id number]  CC:  Chief Complaint  Patient presents with   Headache    Follow up from persistent headaches    HPI Patty [name] is a 57 y.o. female with past medical history of HTN, CVA, OSA, asthma, seizure disorder, HLD, depression and morbid obesity who presents for f/u of her chronic medical conditions.  She c/o severe right sided headache, almost on daily basis, lasting for few hours.  Her pain is sharp, nonradiating and has mild photo phobia.  Denies any nausea or vomiting.  Denies any new visual disturbance or focal neurologic deficits such as numbness or tingling of face. She has chronic numbness and weakness of right extremities. MRI brain is scheduled tomorrow. Of note, she had been  stressed at home, but her anxiety has improved recently with Prozac.  HTN: BP is well-controlled. Takes medications regularly. Patient denies dizziness, chest pain, dyspnea or palpitations.  She has history of CVA and seizure disorder, followed by neurology.  She takes aspirin and statin for history of CVA.  She takes Depakote for seizures.  Denies any recent seizure-like activity.  She takes Prozac for h/o depression.  She denies any anhedonia, SI or HI.    Past Medical History:  Diagnosis Date   Acute respiratory failure with hypoxia ([place]) 10/18/2014   Asthma    Cholecystitis with cholelithiasis 07/12/2014   Cholelithiasis with acute cholecystitis 07/13/2014   DDD (degenerative disc disease), lumbar    Depression    HTN (hypertension)    Hx of scabies    Hyperlipidemia    Rectal bleed 11/13/2011   Seizures ([place])    remote   Stroke [facility]) 1990   chronic right hemiparesis    Past Surgical History:  Procedure Laterality Date   CESAREAN SECTION     CHOLECYSTECTOMY N/A 07/13/2014   Procedure: LAPAROSCOPIC CONVERTED TO OPEN  CHOLECYSTECTOMY ;  Surgeon: Armandina Gemma, MD;   Location: [facility] ORS;  Service: General;  Laterality: N/A;   COLONOSCOPY  12/24/2011   Procedure: COLONOSCOPY;  Surgeon: Danie Binder, MD;  Location: [facility] ENDO SUITE;  Service: Endoscopy;  Laterality: N/A;  2:00   GALLBLADDER SURGERY     [name]   PARTIAL HYSTERECTOMY     heavy bleeding    Family History  Problem Relation Age of Onset   Stroke Mother    Heart attack Mother    Stroke Father    Diabetes Other    Colon cancer Neg Hx    Liver disease Neg Hx     Social History   Socioeconomic History   Marital status: Divorced    Spouse name: Not on file   Number of children: 2   Years of education: Not on file   Highest education level: Not on file  Occupational History   Occupation: disabled    Employer: UNEMPLOYED  Tobacco Use   Smoking status: Never   Smokeless tobacco: Never  Vaping Use   Vaping Use: Never used  Substance and Sexual Activity   Alcohol use: Yes    Alcohol/week: 2.0 standard drinks of alcohol    Types: 2 Glasses of wine per week    Comment: couple of drinks on weekends   Drug use: No   Sexual activity: Not Currently  Other Topics Concern   Not on file  Social History Narrative   On disability. Used to  work at [employer]/Worked at [employer] Retired now   [employer]    Two children.    Daughter: 67    Son: 5   Has 10 grand children.    Enjoys reading      Diet: eats lots of fruits and veggies, occasional meat: chicken mostly, likes pasta    Caffeine: Tea-green lipton   Water: about 5 cups daily      Wears seatbelt   Smoke detectors      Social Determinants of Health   Financial Resource Strain: Low Risk  (05/30/2021)   Overall Financial Resource Strain (CARDIA)    Difficulty of Paying Living Expenses: Not hard at all  Food Insecurity: No Food Insecurity (05/30/2021)   Hunger Vital Sign    Worried About Running Out of Food in the Last Year: Never true    [place] in the Last Year: Never true  Transportation Needs:  Unmet Transportation Needs (05/30/2021)   PRAPARE - Transportation    Lack of Transportation (Medical): Yes    Lack of Transportation (Non-Medical): Yes  Physical Activity: Sufficiently Active (12/25/2020)   Exercise Vital Sign    Days of Exercise per Week: 5 days    Minutes of Exercise per Session: 30 min  Stress: Stress Concern Present (05/30/2021)   [place]    Feeling of Stress : Very much  Social Connections: Moderately Integrated (12/25/2020)   Social Connection and Isolation Panel [NHANES]    Frequency of Communication with Friends and Family: More than three times a week    Frequency of Social Gatherings with Friends and Family: More than three times a week    Attends Religious Services: 1 to 4 times per year    Active Member of [employer] or Organizations: No    Attends Archivist Meetings: 1 to 4 times per year    Marital Status: Divorced  Human resources officer Violence: Not At Risk (12/25/2020)   Humiliation, Afraid, Rape, and Kick questionnaire    Fear of Current or Ex-Partner: No    Emotionally Abused: No    Physically Abused: No    Sexually Abused: No    Outpatient Medications Prior to Visit  Medication Sig Dispense Refill   albuterol (ACCUNEB) 0.63 MG/3ML nebulizer solution Take 3 mLs (0.63 mg total) by nebulization every 6 (six) hours as needed for wheezing or shortness of breath. 3 mL 3   aspirin EC 81 MG tablet Take 1 tablet (81 mg total) by mouth daily.     budesonide-formoterol (SYMBICORT) 80-4.5 MCG/ACT inhaler INHALE 2 PUFFS BY MOUTH EVERY 12 HOURS 10.2 g 2   dicyclomine (BENTYL) 10 MG capsule Take 1 capsule (10 mg total) by mouth 4 (four) times daily as needed for spasms. 30 capsule 1   divalproex (DEPAKOTE) 250 MG DR tablet Take 1 tablet (250 mg total) by mouth 3 (three) times daily. 270 tablet 4   FLUoxetine (PROZAC) 20 MG tablet Take 1 tablet (20 mg total) by mouth daily. 30 tablet 5   gabapentin  (NEURONTIN) 100 MG capsule Take 1 capsule (100 mg total) by mouth 2 (two) times daily. 60 capsule 3   levalbuterol (XOPENEX HFA) 45 MCG/ACT inhaler INHALE 1 TO 2 PUFFS BY MOUTH EVERY 4 TO 6 HOURS AS NEEDED FOR WHEEZING 15 g 5   meclizine (ANTIVERT) 25 MG tablet Take 1 tablet (25 mg total) by mouth 3 (three) times daily as needed for dizziness. 30 tablet 0  Nebulizers (MY MDI PORTABLE NEBULISER) MISC 1 application by Does not apply route as needed. 1 each 0   ondansetron (ZOFRAN) 4 MG tablet Take 1 tablet (4 mg total) by mouth every 8 (eight) hours as needed for nausea or vomiting. 20 tablet 0   oxybutynin (DITROPAN-XL) 5 MG 24 hr tablet Take 5 mg by mouth daily.     pravastatin (PRAVACHOL) 20 MG tablet TAKE 1 TABLET(20 MG) BY MOUTH DAILY 90 tablet 2   UNABLE TO FIND 1 application by Other route daily. Wrist splint for carpal tunnel syndrome: Apply it daily. 1 each 0   verapamil (CALAN) 120 MG tablet TAKE 1 TABLET(120 MG) BY MOUTH DAILY 90 tablet 1   No facility-administered medications prior to visit.    Allergies  Allergen Reactions   Bee Venom Swelling    ROS Review of Systems  Constitutional:  Positive for fatigue. Negative for chills and fever.  HENT:  Negative for congestion, sinus pressure, sinus pain and sore throat.   Eyes:  Negative for pain and discharge.  Respiratory:  Negative for cough and shortness of breath.   Cardiovascular:  Negative for chest pain and palpitations.  Gastrointestinal:  Negative for abdominal pain, diarrhea, nausea and vomiting.  Endocrine: Negative for polydipsia and polyuria.  Genitourinary:  Negative for dysuria and hematuria.  Musculoskeletal:  Negative for neck pain and neck stiffness.  Skin:  Negative for rash.  Neurological:  Positive for headaches. Negative for dizziness and weakness.  Psychiatric/Behavioral:  Positive for sleep disturbance. Negative for agitation and behavioral problems.       Objective:    Physical Exam Vitals  reviewed.  Constitutional:      General: She is not in acute distress.    Appearance: She is obese. She is not diaphoretic.  HENT:     Head: Normocephalic and atraumatic.     Nose: Nose normal. No congestion.     Mouth/Throat:     Mouth: Mucous membranes are moist.     Pharynx: No posterior oropharyngeal erythema.  Eyes:     General: No scleral icterus.    Extraocular Movements: Extraocular movements intact.  Cardiovascular:     Rate and Rhythm: Normal rate and regular rhythm.     Pulses: Normal pulses.     Heart sounds: Normal heart sounds. No murmur heard. Pulmonary:     Breath sounds: No wheezing or rales.  Musculoskeletal:     Cervical back: Neck supple. No tenderness.     Right lower leg: No edema.     Left lower leg: No edema.  Skin:    General: Skin is warm.     Findings: No rash.  Neurological:     General: No focal deficit present.     Mental Status: She is alert and oriented to person, place, and time.     Motor: Weakness (RUE - 4/5, RLE - 4/5) present.  Psychiatric:        Mood and Affect: Mood normal.        Behavior: Behavior normal.     BP 116/72 (BP Location: Right Arm, Patient Position: Sitting, Cuff Size: Normal)   Pulse (!) 113   Ht '5\' 2"'$  (1.575 m)   Wt 192 lb 9.6 oz (87.4 kg)   SpO2 94%   BMI 35.23 kg/m  Wt Readings from Last 3 Encounters:  06/05/22 192 lb 9.6 oz (87.4 kg)  05/15/22 197 lb 12.8 oz (89.7 kg)  04/29/22 195 lb (88.5 kg)    Lab Results  Component Value Date   TSH 1.840 09/21/2020   Lab Results  Component Value Date   WBC 5.9 08/16/2021   HGB 14.2 08/16/2021   HCT 41.6 08/16/2021   MCV 91 08/16/2021   PLT 244 08/16/2021   Lab Results  Component Value Date   NA 141 08/16/2021   K 4.7 08/16/2021   CO2 25 08/16/2021   GLUCOSE 89 08/16/2021   BUN 17 08/16/2021   CREATININE 0.85 08/16/2021   BILITOT <0.2 09/21/2020   ALKPHOS 97 09/21/2020   AST 13 09/21/2020   ALT 18 09/21/2020   PROT 6.7 09/21/2020   ALBUMIN 4.1  09/21/2020   CALCIUM 9.4 08/16/2021   ANIONGAP 7 03/07/2016   EGFR 80 08/16/2021   Lab Results  Component Value Date   CHOL 192 08/16/2021   Lab Results  Component Value Date   HDL 51 08/16/2021   Lab Results  Component Value Date   LDLCALC 112 (H) 08/16/2021   Lab Results  Component Value Date   TRIG 166 (H) 08/16/2021   Lab Results  Component Value Date   CHOLHDL 3.8 08/16/2021   Lab Results  Component Value Date   HGBA1C 5.9 (H) 01/02/2021      Assessment & Plan:   Problem List Items Addressed This Visit       Cardiovascular and Mediastinum   Essential hypertension    BP Readings from Last 1 Encounters:  06/05/22 116/72  Well-controlled with Verapamil Counseled for compliance with the medications Advised DASH diet and moderate exercise/walking, at least 150 mins/week      Intractable chronic migraine without aura and without status migrainosus - Primary    Considering her history of CVA and seizure disorder, would prefer to get MRI of brain to rule out acute etiology She has tried Aleve with mild relief, but advised to avoid daily intake Trial of Tylenol and Excedrin Migraine alternatively Less likely to be GCA as she does not have temporal area tenderness or vision change, and headache is intermittent She is on Depakote and Verapamil currently, which have ppx role in migraine as well Added Nurtec, sample provided - unable to take triptans due to h/o CVA      Relevant Medications   Rimegepant Sulfate (NURTEC) 75 MG TBDP     Other   Depression, recurrent ([place])    Has tried Lexapro in the past Had titrated Lexapro and switched to Prozac Was uncontrolled due to stress at home, increased dose of Prozac to 20 mg QD recently - now better      Meds ordered this encounter  Medications   Rimegepant Sulfate (NURTEC) 75 MG TBDP    Sig: Take 1 tablet (75 mg total) by mouth every other day.    Dispense:  16 tablet    Refill:  0     Follow-up: Return if  symptoms worsen or fail to improve.    Lindell Spar, MD

## 2022-06-05 NOTE — Patient Instructions (Signed)
Please take Nurtec as needed for migraine. Please avoid more than 5 doses in a week.  Please avoid excessive sunlight or loud noise exposure.

## 2022-06-05 NOTE — Addendum Note (Signed)
Addended byIhor Dow on: 06/05/2022 04:23 PM   Modules accepted: Level of Service

## 2022-06-05 NOTE — Assessment & Plan Note (Signed)
BP Readings from Last 1 Encounters:  06/05/22 116/72   Well-controlled with Verapamil Counseled for compliance with the medications Advised DASH diet and moderate exercise/walking, at least 150 mins/week

## 2022-06-06 ENCOUNTER — Ambulatory Visit (HOSPITAL_COMMUNITY)
Admission: RE | Admit: 2022-06-06 | Discharge: 2022-06-06 | Disposition: A | Discharge status: Home / Self Care | Payer: 59 | Source: Ambulatory Visit | Attending: Internal Medicine | Admitting: Internal Medicine

## 2022-06-06 DIAGNOSIS — I69359 Hemiplegia and hemiparesis following cerebral infarction affecting unspecified side: Secondary | ICD-10-CM

## 2022-06-06 DIAGNOSIS — G40909 Epilepsy, unspecified, not intractable, without status epilepticus: Secondary | ICD-10-CM

## 2022-06-06 DIAGNOSIS — G4452 New daily persistent headache (NDPH): Secondary | ICD-10-CM | POA: Diagnosis present

## 2022-07-02 ENCOUNTER — Other Ambulatory Visit: Payer: Self-pay | Admitting: Internal Medicine

## 2022-07-02 DIAGNOSIS — I10 Essential (primary) hypertension: Secondary | ICD-10-CM

## 2022-07-04 ENCOUNTER — Telehealth: Payer: Self-pay | Admitting: Internal Medicine

## 2022-07-04 ENCOUNTER — Other Ambulatory Visit: Payer: Self-pay | Admitting: Internal Medicine

## 2022-07-04 DIAGNOSIS — R42 Dizziness and giddiness: Secondary | ICD-10-CM

## 2022-07-04 MED ORDER — MECLIZINE HCL 25 MG PO TABS
25.0000 mg | ORAL_TABLET | Freq: Three times a day (TID) | ORAL | 1 refills | Status: DC | PRN
Start: 2022-07-04 — End: 2022-10-29

## 2022-07-04 NOTE — Telephone Encounter (Signed)
Patient called said need refill on nausea and dizziness medication.Does not know the name of medicine  Pharmacy: [facility]

## 2022-07-12 ENCOUNTER — Ambulatory Visit: Payer: 59 | Admitting: Family Medicine

## 2022-08-10 ENCOUNTER — Other Ambulatory Visit: Payer: Self-pay | Admitting: Internal Medicine

## 2022-09-04 ENCOUNTER — Other Ambulatory Visit: Payer: Self-pay | Admitting: Internal Medicine

## 2022-09-04 DIAGNOSIS — J4540 Moderate persistent asthma, uncomplicated: Secondary | ICD-10-CM

## 2022-09-08 ENCOUNTER — Emergency Department (HOSPITAL_COMMUNITY): Payer: 59

## 2022-09-08 ENCOUNTER — Other Ambulatory Visit: Payer: Self-pay

## 2022-09-08 ENCOUNTER — Emergency Department (HOSPITAL_COMMUNITY)
Admission: EM | Admit: 2022-09-08 | Discharge: 2022-09-08 | Discharge status: Against Medical Advice | Payer: 59 | Attending: Emergency Medicine | Admitting: Emergency Medicine

## 2022-09-08 ENCOUNTER — Encounter (HOSPITAL_COMMUNITY): Payer: Self-pay | Admitting: *Deleted

## 2022-09-08 DIAGNOSIS — Z7982 Long term (current) use of aspirin: Secondary | ICD-10-CM | POA: Insufficient documentation

## 2022-09-08 DIAGNOSIS — Z79899 Other long term (current) drug therapy: Secondary | ICD-10-CM | POA: Insufficient documentation

## 2022-09-08 DIAGNOSIS — R531 Weakness: Secondary | ICD-10-CM | POA: Insufficient documentation

## 2022-09-08 DIAGNOSIS — M542 Cervicalgia: Secondary | ICD-10-CM | POA: Insufficient documentation

## 2022-09-08 DIAGNOSIS — I10 Essential (primary) hypertension: Secondary | ICD-10-CM | POA: Diagnosis not present

## 2022-09-08 NOTE — ED Notes (Signed)
Went in room to obtain EKG and patient no in room at this time

## 2022-09-08 NOTE — ED Notes (Signed)
Went to xray to check and see if patient is there. Patient was seen walking out with blood pressure cuff on arm.

## 2022-09-08 NOTE — ED Triage Notes (Signed)
Pt reports left side  neck pain radiating down left shoulder and arm x 1 week.

## 2022-09-09 ENCOUNTER — Ambulatory Visit: Payer: Self-pay

## 2022-09-09 ENCOUNTER — Telehealth: Payer: Self-pay

## 2022-09-09 NOTE — Transitions of Care (Post Inpatient/ED Visit) (Signed)
09/09/2022  Name: Patty [name] MRN: [id number] DOB: [birth year]  Today's TOC FU Call Status: Today's TOC FU Call Status:: Successful TOC FU Call Competed TOC FU Call Complete Date: 09/09/22  Transition Care Management Follow-up Telephone Call Date of Discharge: 09/08/22 [facility] Type of Discharge: Emergency Department Reason for ED Visit: Other: (Neck Pain) How have you been since you were released from the hospital?: Same Any questions or concerns?: No  Items Reviewed: Did you receive and understand the discharge instructions provided?: Yes Medications obtained,verified, and reconciled?: Yes (Medications Reviewed) Any new allergies since your discharge?: No Dietary orders reviewed?: Yes Do you have support at home?: Yes  Medications Reviewed Today: Medications Reviewed Today     Reviewed by Merleen Nicely, LPN (Licensed Practical Nurse) on 09/09/22 at 1441  Med List Status: <None>   Medication Order Taking? Sig Documenting Provider Last Dose Status Informant  albuterol (ACCUNEB) 0.63 MG/3ML nebulizer solution [id number] Yes Take 3 mLs (0.63 mg total) by nebulization every 6 (six) hours as needed for wheezing or shortness of breath. Anabel Halon, MD Taking Active   aspirin EC 81 MG tablet [id number] Yes Take 1 tablet (81 mg total) by mouth daily. Aliene Beams, MD Taking Active Self  budesonide-formoterol [facility]) 80-4.5 MCG/ACT inhaler [id number] Yes INHALE 2 PUFFS BY MOUTH EVERY 12 HOURS Heather Roberts, NP Taking Active   dicyclomine (BENTYL) 10 MG capsule [id number] Yes Take 1 capsule (10 mg total) by mouth 4 (four) times daily as needed for spasms. Gilmore Laroche, FNP Taking Active   divalproex (DEPAKOTE) 250 MG DR tablet [id number] Yes Take 1 tablet (250 mg total) by mouth 3 (three) times daily. Anson Fret, MD Taking Active   FLUoxetine (PROZAC) 20 MG tablet [id number] Yes Take 1 tablet (20 mg total) by mouth daily. Anabel Halon, MD  Taking Active   gabapentin (NEURONTIN) 100 MG capsule [id number] Yes Take 1 capsule (100 mg total) by mouth 2 (two) times daily. Anabel Halon, MD Taking Active   levalbuterol [facility] HFA) 45 MCG/ACT inhaler [id number] Yes INHALE 1 TO 2 PUFFS BY MOUTH EVERY 4 TO 6 HOURS AS NEEDED FOR WHEEZING Anabel Halon, MD Taking Active   meclizine (ANTIVERT) 25 MG tablet [id number] Yes Take 1 tablet (25 mg total) by mouth 3 (three) times daily as needed for dizziness. Anabel Halon, MD Taking Active   Nebulizers (MY MDI PORTABLE NEBULISER) MISC [id number] Yes 1 application by Does not apply route as needed. Anabel Halon, MD Taking Active   ondansetron [facility]) 4 MG tablet [id number] Yes Take 1 tablet (4 mg total) by mouth every 8 (eight) hours as needed for nausea or vomiting. Gilmore Laroche, FNP Taking Active   oxybutynin (DITROPAN-XL) 5 MG 24 hr tablet [id number] Yes Take 5 mg by mouth daily. [provider] Taking Active   pravastatin (PRAVACHOL) 20 MG tablet [id number] Yes TAKE 1 TABLET(20 MG) BY MOUTH DAILY Anabel Halon, MD Taking Active   Rimegepant Sulfate (NURTEC) 75 MG TBDP [id number] Yes Take 1 tablet (75 mg total) by mouth every other day. Anabel Halon, MD Taking Active   UNABLE TO FIND [id number] Yes 1 application by Other route daily. Wrist splint for carpal tunnel syndrome: Apply it daily. Anabel Halon, MD Taking Active   verapamil (CALAN) 120 MG tablet [id number] Yes TAKE 1 TABLET(120 MG) BY MOUTH DAILY Anabel Halon, MD Taking Active  Home Care and Equipment/Supplies: Were Home Health Services Ordered?: NA Any new equipment or medical supplies ordered?: NA  Functional Questionnaire: Do you need assistance with bathing/showering or dressing?: No Do you need assistance with meal preparation?: No Do you need assistance with eating?: No Do you have difficulty maintaining continence: No Do you need assistance with getting out of bed/getting out of a  chair/moving?: No Do you have difficulty managing or taking your medications?: No  Follow up appointments reviewed: PCP Follow-up appointment confirmed?: Yes Date of PCP follow-up appointment?: 09/11/22 Follow-up Provider: [facility] Follow-up appointment confirmed?: No Do you need transportation to your follow-up appointment?: No Do you understand care options if your condition(s) worsen?: Yes-patient verbalized understanding    SIGNATURE  Woodfin Ganja LPN [facility] Nurse Health Advisor Direct Dial [phone]

## 2022-09-09 NOTE — Chronic Care Management (AMB) (Signed)
   09/09/2022  Patty [name] [birth year] [id number]   Reason for Encounter: Patient is not currently enrolled in the CCM program. CCM status changed to previously enrolled  .p

## 2022-09-10 NOTE — ED Provider Notes (Signed)
[place] [facility] AT [facility] Provider Note   CSN: [id number] Arrival date & time: 09/08/22  1834     History  Chief Complaint  Patient presents with   Neck Pain    Patty [name] is a 57 y.o. female.   Neck Pain Associated symptoms: no chest pain, no fever, no headaches, no numbness and no weakness        Patty [name] is a 57 y.o. female past medical history of hypertension, seizure disorder, prior stroke with right-sided hemiparesis at baseline.  She presents to the Emergency Department complaining of left-sided neck pain x 1 week.  Describes aching pain along the lateral aspect of her left neck that radiates into her left posterior shoulder and radiates down her left arm.  She states pain is constant, but worsens with movement of her arm.  She denies any known injury.  She also denies any headaches or dizziness, chest pain or shortness of breath.  States she has some weakness and limited mobility of her right side secondary to her history of stroke and typically uses her left arm more.  Home Medications Prior to Admission medications   Medication Sig Start Date End Date Taking? Authorizing Provider  albuterol (ACCUNEB) 0.63 MG/3ML nebulizer solution Take 3 mLs (0.63 mg total) by nebulization every 6 (six) hours as needed for wheezing or shortness of breath. 02/05/22   Anabel Halon, MD  aspirin EC 81 MG tablet Take 1 tablet (81 mg total) by mouth daily. 04/25/17   Aliene Beams, MD  budesonide-formoterol (SYMBICORT) 80-4.5 MCG/ACT inhaler INHALE 2 PUFFS BY MOUTH EVERY 12 HOURS 03/01/21   Heather Roberts, NP  dicyclomine (BENTYL) 10 MG capsule Take 1 capsule (10 mg total) by mouth 4 (four) times daily as needed for spasms. 07/20/21   Gilmore Laroche, FNP  divalproex (DEPAKOTE) 250 MG DR tablet Take 1 tablet (250 mg total) by mouth 3 (three) times daily. 01/01/22   Anson Fret, MD  FLUoxetine (PROZAC) 20 MG tablet Take 1 tablet (20 mg total) by mouth  daily. 02/27/22   Anabel Halon, MD  gabapentin (NEURONTIN) 100 MG capsule Take 1 capsule (100 mg total) by mouth 2 (two) times daily. 02/05/22   Anabel Halon, MD  levalbuterol (XOPENEX HFA) 45 MCG/ACT inhaler INHALE 1 TO 2 PUFFS BY MOUTH EVERY 4 TO 6 HOURS AS NEEDED FOR WHEEZING 09/04/22   Anabel Halon, MD  meclizine (ANTIVERT) 25 MG tablet Take 1 tablet (25 mg total) by mouth 3 (three) times daily as needed for dizziness. 07/04/22   Anabel Halon, MD  Nebulizers (MY MDI PORTABLE NEBULISER) MISC 1 application by Does not apply route as needed. 05/08/20   Anabel Halon, MD  ondansetron (ZOFRAN) 4 MG tablet Take 1 tablet (4 mg total) by mouth every 8 (eight) hours as needed for nausea or vomiting. 07/20/21   Gilmore Laroche, FNP  oxybutynin (DITROPAN-XL) 5 MG 24 hr tablet Take 5 mg by mouth daily. 07/07/18   [provider]  pravastatin (PRAVACHOL) 20 MG tablet TAKE 1 TABLET(20 MG) BY MOUTH DAILY 08/12/22   Anabel Halon, MD  Rimegepant Sulfate (NURTEC) 75 MG TBDP Take 1 tablet (75 mg total) by mouth every other day. 06/05/22   Anabel Halon, MD  UNABLE TO FIND 1 application by Other route daily. Wrist splint for carpal tunnel syndrome: Apply it daily. 05/08/20   Anabel Halon, MD  verapamil (CALAN) 120 MG tablet TAKE  1 TABLET(120 MG) BY MOUTH DAILY 07/02/22   Anabel Halon, MD      Allergies    Bee venom    Review of Systems   Review of Systems  Constitutional:  Negative for appetite change, chills and fever.  Respiratory:  Negative for shortness of breath.   Cardiovascular:  Negative for chest pain.  Gastrointestinal:  Negative for abdominal pain, nausea and vomiting.  Musculoskeletal:  Positive for neck pain. Negative for back pain and neck stiffness.  Skin:  Negative for pallor and rash.  Neurological:  Negative for dizziness, syncope, weakness, numbness and headaches.    Physical Exam Updated Vital Signs BP 117/78 (BP Location: Right Arm)   Pulse 76   Temp 98.9 F (37.2  C) (Oral)   Resp 16   Ht 5\' 2"  (1.575 m)   Wt 86.2 kg   SpO2 95%   BMI 34.75 kg/m  Physical Exam Vitals and nursing note reviewed.  Constitutional:      General: She is not in acute distress.    Appearance: Normal appearance. She is not ill-appearing or toxic-appearing.  Neck:     Meningeal: Kernig's sign absent.     Comments: Tender to palpation along the left cervical paraspinal muscles and left trapezius and upper rhomboid muscles.  No bony tenderness or decreased range of motion of the left arm Cardiovascular:     Rate and Rhythm: Normal rate and regular rhythm.     Pulses: Normal pulses.  Pulmonary:     Effort: Pulmonary effort is normal.  Chest:     Chest wall: No tenderness.  Musculoskeletal:        General: Tenderness present. No signs of injury.     Cervical back: No rigidity or crepitus. Pain with movement and muscular tenderness present. No spinous process tenderness.  Lymphadenopathy:     Cervical: No cervical adenopathy.  Skin:    General: Skin is warm.     Capillary Refill: Capillary refill takes less than 2 seconds.     Findings: No rash.  Neurological:     General: No focal deficit present.     Mental Status: She is alert.     Sensory: No sensory deficit.     Motor: No weakness.     ED Results / Procedures / Treatments   Labs (all labs ordered are listed, but only abnormal results are displayed) Labs Reviewed - No data to display  EKG None  Radiology No results found.  Procedures Procedures    Medications Ordered in ED Medications - No data to display  ED Course/ Medical Decision Making/ A&P                             Medical Decision Making Patient here with complaint of 1 week history of left-sided neck pain that radiates into her arm and left upper back.  There is no known injury.  History of prior stroke with right-sided hemiparesis at baseline.  Differential would include but not limited to musculoskeletal injury, cervical disc  disease with radiculopathy, acute neurologic process.  ACS.  My exam findings suggest radicular symptoms, I have low clinical suspicion symptoms are related to acute neurologic process.  Amount and/or Complexity of Data Reviewed Discussion of management or test interpretation with external provider(s): Vital signs reviewed, imaging was ordered along with EKG  I was notified by nursing that when she went into the room to obtain EKG patient had left  the department without notifying nursing staff or myself.  She also left prior to receiving images           Final Clinical Impression(s) / ED Diagnoses Final diagnoses:  Neck pain    Rx / DC Orders ED Discharge Orders     None         [name], PA-C 09/10/22 1413    Bethann Berkshire, MD 09/14/22 1228

## 2022-09-11 ENCOUNTER — Inpatient Hospital Stay: Payer: 59 | Admitting: Internal Medicine

## 2022-09-18 ENCOUNTER — Ambulatory Visit (HOSPITAL_COMMUNITY)
Admission: RE | Admit: 2022-09-18 | Discharge: 2022-09-18 | Disposition: A | Discharge status: Home / Self Care | Payer: 59 | Source: Ambulatory Visit | Attending: Internal Medicine | Admitting: Internal Medicine

## 2022-09-18 ENCOUNTER — Encounter: Payer: Self-pay | Admitting: Internal Medicine

## 2022-09-18 ENCOUNTER — Ambulatory Visit (INDEPENDENT_AMBULATORY_CARE_PROVIDER_SITE_OTHER): Payer: 59 | Admitting: Internal Medicine

## 2022-09-18 ENCOUNTER — Other Ambulatory Visit: Payer: Self-pay | Admitting: Internal Medicine

## 2022-09-18 VITALS — BP 115/79 | HR 76 | Ht 62.0 in | Wt 187.0 lb

## 2022-09-18 DIAGNOSIS — G609 Hereditary and idiopathic neuropathy, unspecified: Secondary | ICD-10-CM

## 2022-09-18 DIAGNOSIS — I69359 Hemiplegia and hemiparesis following cerebral infarction affecting unspecified side: Secondary | ICD-10-CM | POA: Diagnosis not present

## 2022-09-18 DIAGNOSIS — M5412 Radiculopathy, cervical region: Secondary | ICD-10-CM | POA: Diagnosis present

## 2022-09-18 DIAGNOSIS — Z09 Encounter for follow-up examination after completed treatment for conditions other than malignant neoplasm: Secondary | ICD-10-CM

## 2022-09-18 DIAGNOSIS — F339 Major depressive disorder, recurrent, unspecified: Secondary | ICD-10-CM

## 2022-09-18 DIAGNOSIS — G5603 Carpal tunnel syndrome, bilateral upper limbs: Secondary | ICD-10-CM

## 2022-09-18 MED ORDER — METHYLPREDNISOLONE 4 MG PO TBPK
ORAL_TABLET | ORAL | 0 refills | Status: DC
Start: 2022-09-18 — End: 2022-10-29

## 2022-09-18 MED ORDER — GABAPENTIN 100 MG PO CAPS: 100.0000 mg | ORAL_CAPSULE | Freq: Two times a day (BID) | ORAL | 3 refills | Status: DC

## 2022-09-18 MED ORDER — CYCLOBENZAPRINE HCL 5 MG PO TABS: 5.0000 mg | ORAL_TABLET | Freq: Two times a day (BID) | ORAL | 1 refills | Status: DC | PRN

## 2022-09-18 NOTE — Assessment & Plan Note (Signed)
Her hands symptoms could be due to carpal tunnel syndrome Advised to use wrist brace at nighttime Gabapentin 100 mg twice daily Referred to orthopedic surgery  

## 2022-09-18 NOTE — Assessment & Plan Note (Signed)
Neck pain with radicular symptoms, likely due to cervical radiculopathy Recent worsening likely due to muscular strain Medrol Dosepak Flexeril as needed for muscle spasms Continue gabapentin 100 mg twice daily Check x-ray of cervical spine

## 2022-09-18 NOTE — Assessment & Plan Note (Signed)
With right hemiparesis On aspirin and statin 

## 2022-09-18 NOTE — Patient Instructions (Signed)
Please start taking Prednisone as prescribed.  Please take Cyclobenzaprine as needed for muscle stiffness or spasms.  Please continue taking Gabapentin as prescribed.  Please bring home medications in the next visit.

## 2022-09-18 NOTE — Progress Notes (Signed)
Established Patient Office Visit  Subjective:  Patient ID: Patty [name], female    DOB: [birth year]  Age: 56 y.o. MRN: [id number]  CC:  Chief Complaint  Patient presents with   pinched nerve    Patient states she has a pinched nerve in her neck that radiates down her arm     HPI Patty DAVAE NORGREN is a 57 y.o. female with past medical history of HTN, CVA, OSA, asthma, seizure disorder, HLD, depression and morbid obesity who presents for f/u of recent ER visit for neck pain on 06/09.  She went to ER for complaint of neck pain, which was constant, radiating to left shoulder, arm and forearm area.  She denies any recent injury.  She did not wait in the ER for any imaging.  She has tried taking Tylenol, Aleve, Motrin and naproxen without much relief.  She has chronic numbness of the hand, due to carpal tunnel syndrome.  She reports that her pain has improved, compared to the ER visit day, but still has chronic neck pain and stiffness of the left shoulder area.  She is taking gabapentin as well.  Past Medical History:  Diagnosis Date   Acute respiratory failure with hypoxia (HCC) 10/18/2014   Asthma    Cholecystitis with cholelithiasis 07/12/2014   Cholelithiasis with acute cholecystitis 07/13/2014   DDD (degenerative disc disease), lumbar    Depression    HTN (hypertension)    Hx of scabies    Hyperlipidemia    Rectal bleed 11/13/2011   Seizures (HCC)    remote   Stroke [facility] 1990   chronic right hemiparesis    Past Surgical History:  Procedure Laterality Date   CESAREAN SECTION     CHOLECYSTECTOMY N/A 07/13/2014   Procedure: LAPAROSCOPIC CONVERTED TO OPEN  CHOLECYSTECTOMY ;  Surgeon: Darnell Level, MD;  Location: [facility] ORS;  Service: General;  Laterality: N/A;   COLONOSCOPY  12/24/2011   Procedure: COLONOSCOPY;  Surgeon: West Bali, MD;  Location: [facility] ENDO SUITE;  Service: Endoscopy;  Laterality: N/A;  2:00   GALLBLADDER SURGERY     Patty [name] /[place]   PARTIAL HYSTERECTOMY      heavy bleeding    Family History  Problem Relation Age of Onset   Stroke Mother    Heart attack Mother    Stroke Father    Diabetes Other    Colon cancer Neg Hx    Liver disease Neg Hx     Social History   Socioeconomic History   Marital status: Divorced    Spouse name: Not on file   Number of children: 2   Years of education: Not on file   Highest education level: Not on file  Occupational History   Occupation: disabled    Employer: UNEMPLOYED  Tobacco Use   Smoking status: Never   Smokeless tobacco: Never  Vaping Use   Vaping Use: Never used  Substance and Sexual Activity   Alcohol use: Not Currently    Alcohol/week: 2.0 standard drinks of alcohol    Types: 2 Glasses of wine per week    Comment: couple of drinks on weekends   Drug use: No   Sexual activity: Not Currently  Other Topics Concern   Not on file  Social History Narrative   On disability. Used to work at [employer]/Worked at [employer] Retired now   [employer].    Two children.    Daughter: 47    Son: 70   Has 10 grand  children.    Enjoys reading      Diet: eats lots of fruits and veggies, occasional meat: chicken mostly, likes pasta    Caffeine: Tea-green lipton   Water: about 5 cups daily      Wears seatbelt   Smoke detectors      Social Determinants of Health   Financial Resource Strain: Low Risk  (05/30/2021)   Overall Financial Resource Strain (CARDIA)    Difficulty of Paying Living Expenses: Not hard at all  Food Insecurity: No Food Insecurity (05/30/2021)   Hunger Vital Sign    Worried About Running Out of Food in the Last Year: Never true    Ran Out of Food in the Last Year: Never true  Transportation Needs: Unmet Transportation Needs (05/30/2021)   PRAPARE - Transportation    Lack of Transportation (Medical): Yes    Lack of Transportation (Non-Medical): Yes  Physical Activity: Sufficiently Active (12/25/2020)   Exercise Vital Sign    Days of Exercise per Week: 5 days    Minutes  of Exercise per Session: 30 min  Stress: Stress Concern Present (05/30/2021)   [employer] of Occupational Health - Occupational Stress Questionnaire    Feeling of Stress : Very much  Social Connections: Moderately Integrated (12/25/2020)   Social Connection and Isolation Panel [NHANES]    Frequency of Communication with Friends and Family: More than three times a week    Frequency of Social Gatherings with Friends and Family: More than three times a week    Attends Religious Services: 1 to 4 times per year    Active Member of [employer] or Organizations: No    Attends Banker Meetings: 1 to 4 times per year    Marital Status: Divorced  Catering manager Violence: Not At Risk (12/25/2020)   Humiliation, Afraid, Rape, and Kick questionnaire    Fear of Current or Ex-Partner: No    Emotionally Abused: No    Physically Abused: No    Sexually Abused: No    Outpatient Medications Prior to Visit  Medication Sig Dispense Refill   albuterol (ACCUNEB) 0.63 MG/3ML nebulizer solution Take 3 mLs (0.63 mg total) by nebulization every 6 (six) hours as needed for wheezing or shortness of breath. 3 mL 3   aspirin EC 81 MG tablet Take 1 tablet (81 mg total) by mouth daily.     budesonide-formoterol (SYMBICORT) 80-4.5 MCG/ACT inhaler INHALE 2 PUFFS BY MOUTH EVERY 12 HOURS 10.2 g 2   dicyclomine (BENTYL) 10 MG capsule Take 1 capsule (10 mg total) by mouth 4 (four) times daily as needed for spasms. 30 capsule 1   divalproex (DEPAKOTE) 250 MG DR tablet Take 1 tablet (250 mg total) by mouth 3 (three) times daily. 270 tablet 4   levalbuterol (XOPENEX HFA) 45 MCG/ACT inhaler INHALE 1 TO 2 PUFFS BY MOUTH EVERY 4 TO 6 HOURS AS NEEDED FOR WHEEZING 15 g 5   meclizine (ANTIVERT) 25 MG tablet Take 1 tablet (25 mg total) by mouth 3 (three) times daily as needed for dizziness. 30 tablet 1   Nebulizers (MY MDI PORTABLE NEBULISER) MISC 1 application by Does not apply route as needed. 1 each 0   ondansetron  (ZOFRAN) 4 MG tablet Take 1 tablet (4 mg total) by mouth every 8 (eight) hours as needed for nausea or vomiting. 20 tablet 0   oxybutynin (DITROPAN-XL) 5 MG 24 hr tablet Take 5 mg by mouth daily.     pravastatin (PRAVACHOL) 20 MG tablet TAKE 1  TABLET(20 MG) BY MOUTH DAILY 90 tablet 2   Rimegepant Sulfate (NURTEC) 75 MG TBDP Take 1 tablet (75 mg total) by mouth every other day. 16 tablet 0   UNABLE TO FIND 1 application by Other route daily. Wrist splint for carpal tunnel syndrome: Apply it daily. 1 each 0   verapamil (CALAN) 120 MG tablet TAKE 1 TABLET(120 MG) BY MOUTH DAILY 90 tablet 1   FLUoxetine (PROZAC) 20 MG tablet Take 1 tablet (20 mg total) by mouth daily. 30 tablet 5   gabapentin (NEURONTIN) 100 MG capsule Take 1 capsule (100 mg total) by mouth 2 (two) times daily. 60 capsule 3   No facility-administered medications prior to visit.    Allergies  Allergen Reactions   Bee Venom Swelling    ROS Review of Systems  Constitutional:  Positive for fatigue. Negative for chills and fever.  HENT:  Negative for congestion, sinus pressure, sinus pain and sore throat.   Eyes:  Negative for pain and discharge.  Respiratory:  Negative for cough and shortness of breath.   Cardiovascular:  Negative for chest pain and palpitations.  Gastrointestinal:  Negative for abdominal pain, diarrhea, nausea and vomiting.  Endocrine: Negative for polydipsia and polyuria.  Genitourinary:  Negative for dysuria and hematuria.  Musculoskeletal:  Positive for myalgias and neck pain. Negative for neck stiffness.  Skin:  Negative for rash.  Neurological:  Positive for headaches. Negative for dizziness and weakness.  Psychiatric/Behavioral:  Positive for sleep disturbance. Negative for agitation and behavioral problems.       Objective:    Physical Exam Vitals reviewed.  Constitutional:      General: She is not in acute distress.    Appearance: She is obese. She is not diaphoretic.  HENT:     Head:  Normocephalic and atraumatic.     Nose: Nose normal. No congestion.     Mouth/Throat:     Mouth: Mucous membranes are moist.     Pharynx: No posterior oropharyngeal erythema.  Eyes:     General: No scleral icterus.    Extraocular Movements: Extraocular movements intact.  Cardiovascular:     Rate and Rhythm: Normal rate and regular rhythm.     Heart sounds: Normal heart sounds. No murmur heard. Pulmonary:     Breath sounds: No wheezing or rales.  Musculoskeletal:     Cervical back: Neck supple. Tenderness present. Pain with movement present.     Right lower leg: No edema.     Left lower leg: No edema.  Skin:    General: Skin is warm.     Findings: No rash.  Neurological:     General: No focal deficit present.     Mental Status: She is alert and oriented to person, place, and time.     Motor: Weakness (RUE - 4/5, RLE - 4/5) present.  Psychiatric:        Mood and Affect: Mood normal.        Behavior: Behavior normal.     BP 115/79 (BP Location: Right Arm, Patient Position: Sitting, Cuff Size: Normal)   Pulse 76   Ht 5\' 2"  (1.575 m)   Wt 187 lb (84.8 kg)   SpO2 96%   BMI 34.20 kg/m  Wt Readings from Last 3 Encounters:  09/18/22 187 lb (84.8 kg)  09/08/22 190 lb (86.2 kg)  06/05/22 192 lb 9.6 oz (87.4 kg)    Lab Results  Component Value Date   TSH 1.840 09/21/2020   Lab Results  Component Value Date   WBC 5.9 08/16/2021   HGB 14.2 08/16/2021   HCT 41.6 08/16/2021   MCV 91 08/16/2021   PLT 244 08/16/2021   Lab Results  Component Value Date   NA 141 08/16/2021   K 4.7 08/16/2021   CO2 25 08/16/2021   GLUCOSE 89 08/16/2021   BUN 17 08/16/2021   CREATININE 0.85 08/16/2021   BILITOT <0.2 09/21/2020   ALKPHOS 97 09/21/2020   AST 13 09/21/2020   ALT 18 09/21/2020   PROT 6.7 09/21/2020   ALBUMIN 4.1 09/21/2020   CALCIUM 9.4 08/16/2021   ANIONGAP 7 03/07/2016   EGFR 80 08/16/2021   Lab Results  Component Value Date   CHOL 192 08/16/2021   Lab Results   Component Value Date   HDL 51 08/16/2021   Lab Results  Component Value Date   LDLCALC 112 (H) 08/16/2021   Lab Results  Component Value Date   TRIG 166 (H) 08/16/2021   Lab Results  Component Value Date   CHOLHDL 3.8 08/16/2021   Lab Results  Component Value Date   HGBA1C 5.9 (H) 01/02/2021      Assessment & Plan:   Problem List Items Addressed This Visit       Nervous and Auditory   Peripheral neuropathy    Advised to take Gabapentin Wrist splint      Relevant Medications   cyclobenzaprine (FLEXERIL) 5 MG tablet   gabapentin (NEURONTIN) 100 MG capsule   CVA, old, hemiparesis (HCC)    With right hemiparesis On aspirin and statin      Carpal tunnel syndrome, bilateral    Her hands symptoms could be due to carpal tunnel syndrome Advised to use wrist brace at nighttime Gabapentin 100 mg twice daily Referred to orthopedic surgery      Relevant Medications   cyclobenzaprine (FLEXERIL) 5 MG tablet   gabapentin (NEURONTIN) 100 MG capsule   Cervical radiculopathy - Primary    Neck pain with radicular symptoms, likely due to cervical radiculopathy Recent worsening likely due to muscular strain Medrol Dosepak Flexeril as needed for muscle spasms Continue gabapentin 100 mg twice daily Check x-ray of cervical spine      Relevant Medications   cyclobenzaprine (FLEXERIL) 5 MG tablet   methylPREDNISolone (MEDROL DOSEPAK) 4 MG TBPK tablet   gabapentin (NEURONTIN) 100 MG capsule   Other Relevant Orders   DG Cervical Spine Complete     Other   Encounter for examination following treatment at hospital    ER chart reviewed, she left before imaging could be performed       Meds ordered this encounter  Medications   cyclobenzaprine (FLEXERIL) 5 MG tablet    Sig: Take 1 tablet (5 mg total) by mouth 2 (two) times daily as needed for muscle spasms.    Dispense:  30 tablet    Refill:  1   methylPREDNISolone (MEDROL DOSEPAK) 4 MG TBPK tablet    Sig: Take as  package instructions.    Dispense:  1 each    Refill:  0   gabapentin (NEURONTIN) 100 MG capsule    Sig: Take 1 capsule (100 mg total) by mouth 2 (two) times daily.    Dispense:  60 capsule    Refill:  3    Follow-up: Return in about 2 weeks (around 10/02/2022) for Annual physical.    Anabel Halon, MD

## 2022-09-18 NOTE — Assessment & Plan Note (Signed)
ER chart reviewed, she left before imaging could be performed

## 2022-09-18 NOTE — Assessment & Plan Note (Signed)
Advised to take Gabapentin Wrist splint 

## 2022-09-23 ENCOUNTER — Other Ambulatory Visit: Payer: Self-pay

## 2022-09-23 DIAGNOSIS — M5030 Other cervical disc degeneration, unspecified cervical region: Secondary | ICD-10-CM

## 2022-10-02 ENCOUNTER — Encounter: Payer: 59 | Admitting: Internal Medicine

## 2022-10-15 ENCOUNTER — Other Ambulatory Visit: Payer: Self-pay | Admitting: Internal Medicine

## 2022-10-15 DIAGNOSIS — F339 Major depressive disorder, recurrent, unspecified: Secondary | ICD-10-CM

## 2022-10-29 ENCOUNTER — Ambulatory Visit (INDEPENDENT_AMBULATORY_CARE_PROVIDER_SITE_OTHER): Payer: 59 | Admitting: Internal Medicine

## 2022-10-29 ENCOUNTER — Encounter: Payer: Self-pay | Admitting: Internal Medicine

## 2022-10-29 VITALS — BP 111/76 | HR 81 | Ht 62.0 in | Wt 187.2 lb

## 2022-10-29 DIAGNOSIS — M5412 Radiculopathy, cervical region: Secondary | ICD-10-CM

## 2022-10-29 DIAGNOSIS — Z0001 Encounter for general adult medical examination with abnormal findings: Secondary | ICD-10-CM

## 2022-10-29 DIAGNOSIS — J4540 Moderate persistent asthma, uncomplicated: Secondary | ICD-10-CM

## 2022-10-29 DIAGNOSIS — I10 Essential (primary) hypertension: Secondary | ICD-10-CM | POA: Diagnosis not present

## 2022-10-29 DIAGNOSIS — G40909 Epilepsy, unspecified, not intractable, without status epilepticus: Secondary | ICD-10-CM

## 2022-10-29 DIAGNOSIS — E559 Vitamin D deficiency, unspecified: Secondary | ICD-10-CM

## 2022-10-29 DIAGNOSIS — Z1211 Encounter for screening for malignant neoplasm of colon: Secondary | ICD-10-CM

## 2022-10-29 DIAGNOSIS — E782 Mixed hyperlipidemia: Secondary | ICD-10-CM

## 2022-10-29 DIAGNOSIS — G43719 Chronic migraine without aura, intractable, without status migrainosus: Secondary | ICD-10-CM

## 2022-10-29 DIAGNOSIS — R7303 Prediabetes: Secondary | ICD-10-CM

## 2022-10-29 DIAGNOSIS — I69359 Hemiplegia and hemiparesis following cerebral infarction affecting unspecified side: Secondary | ICD-10-CM

## 2022-10-29 DIAGNOSIS — G8191 Hemiplegia, unspecified affecting right dominant side: Secondary | ICD-10-CM

## 2022-10-29 NOTE — Assessment & Plan Note (Signed)
Due to old CVA On aspirin and statin

## 2022-10-29 NOTE — Progress Notes (Unsigned)
Established Patient Office Visit  Subjective:  Patient ID: AH TROST, female    DOB: Apr 06, 1965  Age: 57 y.o. MRN: [id number]  CC:  Chief Complaint  Patient presents with   Annual Exam    HPI Patty [name] is a 57 y.o. female with past medical history of HTN, CVA, OSA, asthma, seizure disorder, HLD, depression and morbid obesity who presents for annual physical.  HTN: BP is well-controlled. Takes medications regularly. Patient denies dizziness, chest pain, dyspnea or palpitations.   She has history of CVA and seizure disorder, followed by neurology.  She takes aspirin and statin for history of CVA.  She takes Depakote for seizures.  Denies any recent seizure-like activity.  Her migraine frequency has improved lately.  She was recently given Medrol Dosepak and Flexeril for cervical radiculopathy, and has felt better with it.  She is taking gabapentin 100 mg twice daily as well.   She takes Prozac for h/o depression.  She denies any anhedonia, SI or HI.     Past Medical History:  Diagnosis Date   Acute respiratory failure with hypoxia (HCC) 10/18/2014   Asthma    Cholecystitis with cholelithiasis 07/12/2014   Cholelithiasis with acute cholecystitis 07/13/2014   DDD (degenerative disc disease), lumbar    Depression    HTN (hypertension)    Hx of scabies    Hyperlipidemia    Rectal bleed 11/13/2011   Seizures (HCC)    remote   Stroke [facility]) 1990   chronic right hemiparesis    Past Surgical History:  Procedure Laterality Date   CESAREAN SECTION     CHOLECYSTECTOMY N/A 07/13/2014   Procedure: LAPAROSCOPIC CONVERTED TO OPEN  CHOLECYSTECTOMY ;  Surgeon: Darnell Level, MD;  Location: [facility];  Service: General;  Laterality: N/A;   COLONOSCOPY  12/24/2011   Procedure: COLONOSCOPY;  Surgeon: West Bali, MD;  Location: [facility];  Service: Endoscopy;  Laterality: N/A;  2:00   GALLBLADDER SURGERY     [name] /[place]   PARTIAL HYSTERECTOMY     heavy bleeding     Family History  Problem Relation Age of Onset   Stroke Mother    Heart attack Mother    Stroke Father    Diabetes Other    Colon cancer Neg Hx    Liver disease Neg Hx     Social History   Socioeconomic History   Marital status: Divorced    Spouse name: Not on file   Number of children: 2   Years of education: Not on file   Highest education level: Not on file  Occupational History   Occupation: disabled    Employer: UNEMPLOYED  Tobacco Use   Smoking status: Never   Smokeless tobacco: Never  Vaping Use   Vaping status: Never Used  Substance and Sexual Activity   Alcohol use: Not Currently    Alcohol/week: 2.0 standard drinks of alcohol    Types: 2 Glasses of wine per week    Comment: couple of drinks on weekends   Drug use: No   Sexual activity: Not Currently  Other Topics Concern   Not on file  Social History Narrative   On disability. Used to work at [employer]/Worked at [employer] Retired now   [employer].    Two children.    Daughter: 79    Son: 46   Has 10 grand children.    Enjoys reading      Diet: eats lots of fruits and veggies, occasional  meat: chicken mostly, likes pasta    Caffeine: Tea-green lipton   Water: about 5 cups daily      Wears seatbelt   Smoke detectors      Social Determinants of Health   Financial Resource Strain: Low Risk  (05/30/2021)   Overall Financial Resource Strain (CARDIA)    Difficulty of Paying Living Expenses: Not hard at all  Food Insecurity: No Food Insecurity (05/30/2021)   Hunger Vital Sign    Worried About Running Out of Food in the Last Year: Never true    Ran Out of Food in the Last Year: Never true  Transportation Needs: Unmet Transportation Needs (05/30/2021)   PRAPARE - Transportation    Lack of Transportation (Medical): Yes    Lack of Transportation (Non-Medical): Yes  Physical Activity: Sufficiently Active (12/25/2020)   Exercise Vital Sign    Days of Exercise per Week: 5 days    Minutes of Exercise  per Session: 30 min  Stress: Stress Concern Present (05/30/2021)   [employer] of Occupational Health - Occupational Stress Questionnaire    Feeling of Stress : Very much  Social Connections: Moderately Integrated (12/25/2020)   Social Connection and Isolation Panel [NHANES]    Frequency of Communication with Friends and Family: More than three times a week    Frequency of Social Gatherings with Friends and Family: More than three times a week    Attends Religious Services: 1 to 4 times per year    Active Member of [employer] or Organizations: No    Attends Banker Meetings: 1 to 4 times per year    Marital Status: Divorced  Catering manager Violence: Not At Risk (12/25/2020)   Humiliation, Afraid, Rape, and Kick questionnaire    Fear of Current or Ex-Partner: No    Emotionally Abused: No    Physically Abused: No    Sexually Abused: No    Outpatient Medications Prior to Visit  Medication Sig Dispense Refill   albuterol (ACCUNEB) 0.63 MG/3ML nebulizer solution Take 3 mLs (0.63 mg total) by nebulization every 6 (six) hours as needed for wheezing or shortness of breath. 3 mL 3   aspirin EC 81 MG tablet Take 1 tablet (81 mg total) by mouth daily.     budesonide-formoterol (SYMBICORT) 80-4.5 MCG/ACT inhaler INHALE 2 PUFFS BY MOUTH EVERY 12 HOURS 10.2 g 2   cyclobenzaprine (FLEXERIL) 5 MG tablet Take 1 tablet (5 mg total) by mouth 2 (two) times daily as needed for muscle spasms. 30 tablet 1   divalproex (DEPAKOTE) 250 MG DR tablet Take 1 tablet (250 mg total) by mouth 3 (three) times daily. 270 tablet 4   FLUoxetine (PROZAC) 20 MG capsule TAKE 1 CAPSULE(20 MG) BY MOUTH DAILY 30 capsule 0   gabapentin (NEURONTIN) 100 MG capsule Take 1 capsule (100 mg total) by mouth 2 (two) times daily. 60 capsule 3   levalbuterol (XOPENEX HFA) 45 MCG/ACT inhaler INHALE 1 TO 2 PUFFS BY MOUTH EVERY 4 TO 6 HOURS AS NEEDED FOR WHEEZING 15 g 5   Nebulizers (MY MDI PORTABLE NEBULISER) MISC 1 application  by Does not apply route as needed. 1 each 0   ondansetron (ZOFRAN) 4 MG tablet Take 1 tablet (4 mg total) by mouth every 8 (eight) hours as needed for nausea or vomiting. 20 tablet 0   Rimegepant Sulfate (NURTEC) 75 MG TBDP Take 1 tablet (75 mg total) by mouth every other day. 16 tablet 0   UNABLE TO FIND 1 application by Other  route daily. Wrist splint for carpal tunnel syndrome: Apply it daily. 1 each 0   verapamil (CALAN) 120 MG tablet TAKE 1 TABLET(120 MG) BY MOUTH DAILY 90 tablet 1   dicyclomine (BENTYL) 10 MG capsule Take 1 capsule (10 mg total) by mouth 4 (four) times daily as needed for spasms. 30 capsule 1   meclizine (ANTIVERT) 25 MG tablet Take 1 tablet (25 mg total) by mouth 3 (three) times daily as needed for dizziness. 30 tablet 1   methylPREDNISolone (MEDROL DOSEPAK) 4 MG TBPK tablet Take as package instructions. 1 each 0   oxybutynin (DITROPAN-XL) 5 MG 24 hr tablet Take 5 mg by mouth daily.     pravastatin (PRAVACHOL) 20 MG tablet TAKE 1 TABLET(20 MG) BY MOUTH DAILY 90 tablet 2   No facility-administered medications prior to visit.    Allergies  Allergen Reactions   Bee Venom Swelling    ROS Review of Systems  Constitutional:  Positive for fatigue. Negative for chills and fever.  HENT:  Negative for congestion, sinus pressure, sinus pain and sore throat.   Eyes:  Negative for pain and discharge.  Respiratory:  Negative for cough and shortness of breath.   Cardiovascular:  Negative for chest pain and palpitations.  Gastrointestinal:  Negative for abdominal pain, diarrhea, nausea and vomiting.  Endocrine: Negative for polydipsia and polyuria.  Genitourinary:  Negative for dysuria and hematuria.  Musculoskeletal:  Positive for neck pain. Negative for neck stiffness.  Skin:  Negative for rash.  Neurological:  Positive for headaches. Negative for dizziness and weakness.  Psychiatric/Behavioral:  Positive for sleep disturbance. Negative for agitation and behavioral problems.        Objective:    Physical Exam Vitals reviewed.  Constitutional:      General: She is not in acute distress.    Appearance: She is obese. She is not diaphoretic.  HENT:     Head: Normocephalic and atraumatic.     Nose: Nose normal. No congestion.     Mouth/Throat:     Mouth: Mucous membranes are moist.     Pharynx: No posterior oropharyngeal erythema.  Eyes:     General: No scleral icterus.    Extraocular Movements: Extraocular movements intact.  Cardiovascular:     Rate and Rhythm: Normal rate and regular rhythm.     Heart sounds: Normal heart sounds. No murmur heard. Pulmonary:     Breath sounds: No wheezing or rales.  Abdominal:     Palpations: Abdomen is soft.     Tenderness: There is no abdominal tenderness.  Musculoskeletal:     Cervical back: Neck supple. Tenderness present. Pain with movement present.     Right lower leg: No edema.     Left lower leg: No edema.  Skin:    General: Skin is warm.     Findings: No rash.  Neurological:     General: No focal deficit present.     Mental Status: She is alert and oriented to person, place, and time.     Motor: Weakness (RUE - 4/5, RLE - 4/5) present.  Psychiatric:        Mood and Affect: Mood normal.        Behavior: Behavior normal.     BP 111/76 (BP Location: Left Arm, Patient Position: Sitting, Cuff Size: Large)   Pulse 81   Ht 5\' 2"  (1.575 m)   Wt 187 lb 3.2 oz (84.9 kg)   SpO2 95%   BMI 34.24 kg/m  Wt Readings from  Last 3 Encounters:  10/29/22 187 lb 3.2 oz (84.9 kg)  09/18/22 187 lb (84.8 kg)  09/08/22 190 lb (86.2 kg)    Lab Results  Component Value Date   TSH 1.350 10/29/2022   Lab Results  Component Value Date   WBC 6.6 10/29/2022   HGB 14.0 10/29/2022   HCT 40.9 10/29/2022   MCV 92 10/29/2022   PLT 293 10/29/2022   Lab Results  Component Value Date   NA 141 10/29/2022   K 4.6 10/29/2022   CO2 24 10/29/2022   GLUCOSE 86 10/29/2022   BUN 15 10/29/2022   CREATININE 0.71 10/29/2022    BILITOT <0.2 10/29/2022   ALKPHOS 81 10/29/2022   AST 17 10/29/2022   ALT 16 10/29/2022   PROT 7.1 10/29/2022   ALBUMIN 4.1 10/29/2022   CALCIUM 9.8 10/29/2022   ANIONGAP 7 03/07/2016   EGFR 98 10/29/2022   Lab Results  Component Value Date   CHOL 176 10/29/2022   Lab Results  Component Value Date   HDL 51 10/29/2022   Lab Results  Component Value Date   LDLCALC 105 (H) 10/29/2022   Lab Results  Component Value Date   TRIG 108 10/29/2022   Lab Results  Component Value Date   CHOLHDL 3.5 10/29/2022   Lab Results  Component Value Date   HGBA1C 5.7 (H) 10/29/2022      Assessment & Plan:   Problem List Items Addressed This Visit       Cardiovascular and Mediastinum   Essential hypertension    BP Readings from Last 1 Encounters:  10/29/22 111/76   Well-controlled with Verapamil Counseled for compliance with the medications Advised DASH diet and moderate exercise/walking, at least 150 mins/week      Relevant Medications   pravastatin (PRAVACHOL) 40 MG tablet   Other Relevant Orders   TSH (Completed)   CMP14+EGFR (Completed)   CBC with Differential/Platelet (Completed)   Intractable chronic migraine without aura and without status migrainosus    She has tried Aleve with mild relief, but advised to avoid daily intake Trial of Tylenol and Excedrin Migraine alternatively She is on Depakote and Verapamil currently, which have ppx role in migraine as well Has Nurtec, sample was provided in the past - unable to take triptans due to h/o CVA      Relevant Medications   pravastatin (PRAVACHOL) 40 MG tablet     Respiratory   Moderate persistent asthma    Continue Symbicort and as needed Xopenex She has better response with albuterol nebulizer, prescription sent        Nervous and Auditory   Seizure disorder (HCC)    Takes Depakote Followed by neurology      Relevant Orders   TSH (Completed)   CMP14+EGFR (Completed)   CBC with Differential/Platelet  (Completed)   CVA, old, hemiparesis (HCC)    With right hemiparesis On aspirin and statin      Right hemiparesis (HCC)    Due to old CVA On aspirin and statin      Cervical radiculopathy    Neck pain with radicular symptoms, likely due to cervical radiculopathy Recent worsening likely due to muscular strain Improved with Medrol Dosepak Flexeril as needed for muscle spasms Continue gabapentin 100 mg twice daily        Other   Mixed hyperlipidemia    On statin      Relevant Medications   pravastatin (PRAVACHOL) 40 MG tablet   Other Relevant Orders   Lipid panel (  Completed)   Prediabetes    Lab Results  Component Value Date   HGBA1C 5.7 (H) 10/29/2022   Advised to follow low-carb diet for now      Relevant Orders   Hemoglobin A1c (Completed)   Encounter for general adult medical examination with abnormal findings - Primary    Physical exam as documented. Fasting blood tests today. Advised to get Shingrix and Tdap vaccines at local pharmacy.      Other Visit Diagnoses     Vitamin D deficiency       Relevant Orders   VITAMIN D 25 Hydroxy (Vit-D Deficiency, Fractures) (Completed)   Colon cancer screening       Relevant Orders   Ambulatory referral to Gastroenterology       Meds ordered this encounter  Medications   pravastatin (PRAVACHOL) 40 MG tablet    Sig: Take 1 tablet (40 mg total) by mouth daily.    Dispense:  90 tablet    Refill:  3    Dose change - 10/30/22    Follow-up: Return in about 4 months (around 03/01/2023) for HTN and CVA.    Anabel Halon, MD

## 2022-10-29 NOTE — Assessment & Plan Note (Signed)
With right hemiparesis On aspirin and statin 

## 2022-10-29 NOTE — Patient Instructions (Addendum)
Please take Gabapentin only at bedtime.  Please continue to take medications as prescribed.  Please continue to follow low carb diet and perform moderate exercise/walking at least 150 mins/week.  Please consider getting Shingrix and Tdap vaccine at local pharmacy.

## 2022-10-29 NOTE — Assessment & Plan Note (Signed)
Continue Symbicort and as needed Xopenex She has better response with albuterol nebulizer, prescription sent

## 2022-10-29 NOTE — Assessment & Plan Note (Signed)
Takes Depakote Followed by neurology

## 2022-10-30 ENCOUNTER — Encounter: Payer: Self-pay | Admitting: *Deleted

## 2022-10-30 DIAGNOSIS — Z0001 Encounter for general adult medical examination with abnormal findings: Secondary | ICD-10-CM | POA: Insufficient documentation

## 2022-10-30 MED ORDER — PRAVASTATIN SODIUM 40 MG PO TABS
40.0000 mg | ORAL_TABLET | Freq: Every day | ORAL | 3 refills | Status: DC
Start: 2022-10-30 — End: 2023-11-03

## 2022-10-30 NOTE — Assessment & Plan Note (Signed)
She has tried Aleve with mild relief, but advised to avoid daily intake Trial of Tylenol and Excedrin Migraine alternatively She is on Depakote and Verapamil currently, which have ppx role in migraine as well Has Nurtec, sample was provided in the past - unable to take triptans due to h/o CVA

## 2022-10-30 NOTE — Assessment & Plan Note (Signed)
Lab Results  Component Value Date   HGBA1C 5.7 (H) 10/29/2022   Advised to follow low-carb diet for now

## 2022-10-30 NOTE — Assessment & Plan Note (Signed)
Neck pain with radicular symptoms, likely due to cervical radiculopathy Recent worsening likely due to muscular strain Improved with Medrol Dosepak Flexeril as needed for muscle spasms Continue gabapentin 100 mg twice daily

## 2022-10-30 NOTE — Assessment & Plan Note (Signed)
Physical exam as documented. Fasting blood tests today. Advised to get Shingrix and Tdap vaccines at local pharmacy. 

## 2022-10-30 NOTE — Assessment & Plan Note (Signed)
On statin.

## 2022-10-30 NOTE — Assessment & Plan Note (Signed)
BP Readings from Last 1 Encounters:  10/29/22 111/76   Well-controlled with Verapamil Counseled for compliance with the medications Advised DASH diet and moderate exercise/walking, at least 150 mins/week

## 2022-10-31 ENCOUNTER — Telehealth: Payer: Self-pay | Admitting: Internal Medicine

## 2022-10-31 NOTE — Telephone Encounter (Signed)
lvm

## 2022-10-31 NOTE — Telephone Encounter (Signed)
Incoming call from patient says she is returning a call regarding her lab results. Please advise Thanks

## 2022-11-14 ENCOUNTER — Telehealth: Payer: Self-pay | Admitting: Internal Medicine

## 2022-11-14 NOTE — Telephone Encounter (Signed)
Questionnaire is in review.

## 2022-11-15 NOTE — Telephone Encounter (Signed)
Procedure: Colonoscopy  Estimated body mass index is 34.24 kg/m as calculated from the following:   Height as of 10/29/22: 5\' 2"  (1.575 m).   Weight as of 10/29/22: 187 lb 3.2 oz (84.9 kg).   Have you had a colonoscopy before?  12/24/11, Dr. Darrick Penna  Do you have family history of colon cancer?    Do you have a family history of polyps?   Previous colonoscopy with polyps removed? no  Do you have a history colorectal cancer?   no  Are you diabetic?  no  Do you have a prosthetic or mechanical heart valve? no  Do you have a pacemaker/defibrillator?   no  Have you had endocarditis/atrial fibrillation?  no  Do you use supplemental oxygen/CPAP?  no  Have you had joint replacement within the last 12 months?  no  Do you tend to be constipated or have to use laxatives?  no   Do you have history of alcohol use? If yes, how much and how often.    Do you have history or are you using drugs? If yes, what do are you  using?  no  Have you ever had a stroke/heart attack?  1990, stroke  Have you ever had a heart or other vascular stent placed,?no  Do you take weight loss medication? no  female patients,: have you had a hysterectomy? yes                              are you post menopausal?                                do you still have your menstrual cycle?     Date of last menstrual period?   Do you take any blood-thinning medications such as: (Plavix, aspirin, Coumadin, Aggrenox, Brilinta, Xarelto, Eliquis, Pradaxa, Savaysa or Effient)? Aspirin 81mg   If yes we need the name, milligram, dosage and who is prescribing doctor:               Current Outpatient Medications  Medication Sig Dispense Refill   albuterol (ACCUNEB) 0.63 MG/3ML nebulizer solution Take 3 mLs (0.63 mg total) by nebulization every 6 (six) hours as needed for wheezing or shortness of breath. 3 mL 3   aspirin EC 81 MG tablet Take 1 tablet (81 mg total) by mouth daily.     budesonide-formoterol (SYMBICORT) 80-4.5  MCG/ACT inhaler INHALE 2 PUFFS BY MOUTH EVERY 12 HOURS 10.2 g 2   cyclobenzaprine (FLEXERIL) 5 MG tablet Take 1 tablet (5 mg total) by mouth 2 (two) times daily as needed for muscle spasms. 30 tablet 1   divalproex (DEPAKOTE) 250 MG DR tablet Take 1 tablet (250 mg total) by mouth 3 (three) times daily. 270 tablet 4   FLUoxetine (PROZAC) 20 MG capsule TAKE 1 CAPSULE(20 MG) BY MOUTH DAILY 30 capsule 0   gabapentin (NEURONTIN) 100 MG capsule Take 1 capsule (100 mg total) by mouth 2 (two) times daily. 60 capsule 3   levalbuterol (XOPENEX HFA) 45 MCG/ACT inhaler INHALE 1 TO 2 PUFFS BY MOUTH EVERY 4 TO 6 HOURS AS NEEDED FOR WHEEZING 15 g 5   Nebulizers (MY MDI PORTABLE NEBULISER) MISC 1 application by Does not apply route as needed. 1 each 0   ondansetron (ZOFRAN) 4 MG tablet Take 1 tablet (4 mg total) by mouth every 8 (eight) hours as needed for  nausea or vomiting. 20 tablet 0   pravastatin (PRAVACHOL) 40 MG tablet Take 1 tablet (40 mg total) by mouth daily. 90 tablet 3   Rimegepant Sulfate (NURTEC) 75 MG TBDP Take 1 tablet (75 mg total) by mouth every other day. 16 tablet 0   UNABLE TO FIND 1 application by Other route daily. Wrist splint for carpal tunnel syndrome: Apply it daily. 1 each 0   verapamil (CALAN) 120 MG tablet TAKE 1 TABLET(120 MG) BY MOUTH DAILY 90 tablet 1   No current facility-administered medications for this visit.    Allergies  Allergen Reactions   Bee Venom Swelling

## 2022-11-18 ENCOUNTER — Ambulatory Visit: Payer: 59 | Admitting: Orthopedic Surgery

## 2022-12-09 ENCOUNTER — Ambulatory Visit: Payer: 59 | Admitting: Orthopedic Surgery

## 2022-12-10 NOTE — Telephone Encounter (Signed)
Okay to schedule ASA 3

## 2022-12-10 NOTE — Telephone Encounter (Signed)
 Will call to schedule once we receive future schedule

## 2022-12-17 NOTE — Telephone Encounter (Signed)
LMTRC

## 2022-12-18 NOTE — Telephone Encounter (Signed)
Pt returned call  Called pt unable to leave message, mailbox is full

## 2022-12-25 NOTE — Telephone Encounter (Signed)
Received message from [name], pt walked into office after 4:30pm yesterday afternoon to schedule procedure. Called pt, LMOVM

## 2023-01-03 ENCOUNTER — Other Ambulatory Visit: Payer: Self-pay | Admitting: Neurology

## 2023-01-03 ENCOUNTER — Other Ambulatory Visit: Payer: Self-pay | Admitting: Internal Medicine

## 2023-01-03 DIAGNOSIS — I10 Essential (primary) hypertension: Secondary | ICD-10-CM

## 2023-01-03 DIAGNOSIS — G8191 Hemiplegia, unspecified affecting right dominant side: Secondary | ICD-10-CM

## 2023-01-03 DIAGNOSIS — R569 Unspecified convulsions: Secondary | ICD-10-CM

## 2023-01-03 DIAGNOSIS — I6389 Other cerebral infarction: Secondary | ICD-10-CM

## 2023-01-07 NOTE — Telephone Encounter (Signed)
Attempted to call pt, unable to leave message due to mailbox being full.

## 2023-01-07 NOTE — Telephone Encounter (Signed)
Patient needs appointment for future refills.

## 2023-01-08 NOTE — Telephone Encounter (Signed)
LMTRC

## 2023-01-14 ENCOUNTER — Encounter: Payer: Self-pay | Admitting: *Deleted

## 2023-01-14 MED ORDER — PEG 3350-KCL-NA BICARB-NACL 420 G PO SOLR: 4000.0000 mL | Freq: Once | ORAL | 0 refills | Status: AC

## 2023-01-14 NOTE — Telephone Encounter (Signed)
Pt walked into office. Scheduled for 12/2. Aware will let know of pre-op. Rx for prep sent to pharmacy. Instructions will be mailed also.

## 2023-01-14 NOTE — Addendum Note (Signed)
Addended by: Armstead Peaks on: 01/14/2023 08:09 AM   Modules accepted: Orders

## 2023-01-21 ENCOUNTER — Encounter: Payer: Self-pay | Admitting: *Deleted

## 2023-01-21 NOTE — Telephone Encounter (Signed)
Referral completed, [facility] apt letter sent to PCP

## 2023-02-08 ENCOUNTER — Other Ambulatory Visit: Payer: Self-pay | Admitting: Internal Medicine

## 2023-02-08 DIAGNOSIS — F339 Major depressive disorder, recurrent, unspecified: Secondary | ICD-10-CM

## 2023-02-24 NOTE — Patient Instructions (Signed)
Patty [name]  02/24/2023     @PREFPERIOPPHARMACY @   Your procedure is scheduled on  03/03/2023.   Report to [facility] at  0600  A.M.   Call this number if you have problems the morning of surgery:  [phone]  If you experience any cold or flu symptoms such as cough, fever, chills, shortness of breath, etc. between now and your scheduled surgery, please notify us at the above number.   Remember:       Use your nebulizer and your inhalers before you come and bring your rescue inhalers with you.    Follow the diet instructions given to you by the office.   You may drink clear liquids until 0330 am on 03/03/2023.    Clear liquids allowed are:                    Water, Juice (No red color; non-citric and without pulp; diabetics please choose diet or no sugar options), Carbonated beverages (diabetics please choose diet or no sugar options), Clear Tea (No creamer, milk, or cream, including half & half and powdered creamer), Black Coffee Only (No creamer, milk or cream, including half & half and powdered creamer), and Clear Sports drink (No red color; diabetics please choose diet or no sugar options)    Take these medicines the morning of surgery with A SIP OF WATER         cyclobenzaprine, divalproex, fluoxetine, gabapentin, nurtec, verapamil.    Do not wear jewelry, make-up or nail polish, including gel polish,  artificial nails, or any other type of covering on natural nails (fingers and  toes).  Do not wear lotions, powders, or perfumes, or deodorant.  Do not shave 48 hours prior to surgery.  Men may shave face and neck.  Do not bring valuables to the hospital.  [facility] is not responsible for any belongings or valuables.  Contacts, dentures or bridgework may not be worn into surgery.  Leave your suitcase in the car.  After surgery it may be brought to your room.  For patients admitted to the hospital, discharge time will be determined by your treatment  team.  Patients discharged the day of surgery will not be allowed to drive home and must have someone with them for 24 hours.    Special instructions:   DO NOT smoke tobacco or vape for 24 hours before your procedure.  Please read over the following fact sheets that you were given. Anesthesia Post-op Instructions and Care and Recovery After Surgery      Colonoscopy, Adult, Care After The following information offers guidance on how to care for yourself after your procedure. Your health care provider may also give you more specific instructions. If you have problems or questions, contact your health care provider. What can I expect after the procedure? After the procedure, it is common to have: A small amount of blood in your stool for 24 hours after the procedure. Some gas. Mild cramping or bloating of your abdomen. Follow these instructions at home: Eating and drinking  Drink enough fluid to keep your urine pale yellow. Follow instructions from your health care provider about eating or drinking restrictions. Resume your normal diet as told by your health care provider. Avoid heavy or fried foods that are hard to digest. Activity Rest as told by your health care provider. Avoid sitting for a long time without moving. Get up to take short walks every 1-2  hours. This is important to improve blood flow and breathing. Ask for help if you feel weak or unsteady. Return to your normal activities as told by your health care provider. Ask your health care provider what activities are safe for you. Managing cramping and bloating  Try walking around when you have cramps or feel bloated. If directed, apply heat to your abdomen as told by your health care provider. Use the heat source that your health care provider recommends, such as a moist heat pack or a heating pad. Place a towel between your skin and the heat source. Leave the heat on for 20-30 minutes. Remove the heat if your skin turns  bright red. This is especially important if you are unable to feel pain, heat, or cold. You have a greater risk of getting burned. General instructions If you were given a sedative during the procedure, it can affect you for several hours. Do not drive or operate machinery until your health care provider says that it is safe. For the first 24 hours after the procedure: Do not sign important documents. Do not drink alcohol. Do your regular daily activities at a slower pace than normal. Eat soft foods that are easy to digest. Take over-the-counter and prescription medicines only as told by your health care provider. Keep all follow-up visits. This is important. Contact a health care provider if: You have blood in your stool 2-3 days after the procedure. Get help right away if: You have more than a small spotting of blood in your stool. You have large blood clots in your stool. You have swelling of your abdomen. You have nausea or vomiting. You have a fever. You have increasing pain in your abdomen that is not relieved with medicine. These symptoms may be an emergency. Get help right away. Call 911. Do not wait to see if the symptoms will go away. Do not drive yourself to the hospital. Summary After the procedure, it is common to have a small amount of blood in your stool. You may also have mild cramping and bloating of your abdomen. If you were given a sedative during the procedure, it can affect you for several hours. Do not drive or operate machinery until your health care provider says that it is safe. Get help right away if you have a lot of blood in your stool, nausea or vomiting, a fever, or increased pain in your abdomen. This information is not intended to replace advice given to you by your health care provider. Make sure you discuss any questions you have with your health care provider. Document Revised: 04/30/2022 Document Reviewed: 11/08/2020 Elsevier Patient Education  2024  [employer] Monitored Anesthesia Care, Care After The following information offers guidance on how to care for yourself after your procedure. Your health care provider may also give you more specific instructions. If you have problems or questions, contact your health care provider. What can I expect after the procedure? After the procedure, it is common to have: Tiredness. Little or no memory about what happened during or after the procedure. Impaired judgment when it comes to making decisions. Nausea or vomiting. Some trouble with balance. Follow these instructions at home: For the time period you were told by your health care provider:  Rest. Do not participate in activities where you could fall or become injured. Do not drive or use machinery. Do not drink alcohol. Do not take sleeping pills or medicines that cause drowsiness. Do not make important decisions or sign  legal documents. Do not take care of children on your own. Medicines Take over-the-counter and prescription medicines only as told by your health care provider. If you were prescribed antibiotics, take them as told by your health care provider. Do not stop using the antibiotic even if you start to feel better. Eating and drinking Follow instructions from your health care provider about what you may eat and drink. Drink enough fluid to keep your urine pale yellow. If you vomit: Drink clear fluids slowly and in small amounts as you are able. Clear fluids include water, ice chips, low-calorie sports drinks, and fruit juice that has water added to it (diluted fruit juice). Eat light and bland foods in small amounts as you are able. These foods include bananas, applesauce, rice, lean meats, toast, and crackers. General instructions  Have a responsible adult stay with you for the time you are told. It is important to have someone help care for you until you are awake and alert. If you have sleep apnea, surgery and some  medicines can increase your risk for breathing problems. Follow instructions from your health care provider about wearing your sleep device: When you are sleeping. This includes during daytime naps. While taking prescription pain medicines, sleeping medicines, or medicines that make you drowsy. Do not use any products that contain nicotine or tobacco. These products include cigarettes, chewing tobacco, and vaping devices, such as e-cigarettes. If you need help quitting, ask your health care provider. Contact a health care provider if: You feel nauseous or vomit every time you eat or drink. You feel light-headed. You are still sleepy or having trouble with balance after 24 hours. You get a rash. You have a fever. You have redness or swelling around the IV site. Get help right away if: You have trouble breathing. You have new confusion after you get home. These symptoms may be an emergency. Get help right away. Call 911. Do not wait to see if the symptoms will go away. Do not drive yourself to the hospital. This information is not intended to replace advice given to you by your health care provider. Make sure you discuss any questions you have with your health care provider. Document Revised: 08/13/2021 Document Reviewed: 08/13/2021 Elsevier Patient Education  2024 [employer].

## 2023-02-25 ENCOUNTER — Other Ambulatory Visit: Payer: Self-pay

## 2023-02-25 MED ORDER — BUDESONIDE-FORMOTEROL FUMARATE 80-4.5 MCG/ACT IN AERO: INHALATION_SPRAY | RESPIRATORY_TRACT | 2 refills | Status: DC

## 2023-02-26 ENCOUNTER — Encounter (HOSPITAL_COMMUNITY)
Admission: RE | Admit: 2023-02-26 | Discharge: 2023-02-26 | Disposition: A | Discharge status: Home / Self Care | Payer: 59 | Source: Ambulatory Visit | Attending: Internal Medicine | Admitting: Internal Medicine

## 2023-02-26 ENCOUNTER — Encounter (HOSPITAL_COMMUNITY): Payer: Self-pay

## 2023-02-26 ENCOUNTER — Other Ambulatory Visit: Payer: Self-pay

## 2023-02-26 ENCOUNTER — Encounter (HOSPITAL_COMMUNITY): Payer: Self-pay | Admitting: Anesthesiology

## 2023-02-26 VITALS — BP 106/74 | HR 92 | Temp 97.9°F | Resp 18 | Ht 62.0 in | Wt 185.9 lb

## 2023-02-26 DIAGNOSIS — I10 Essential (primary) hypertension: Secondary | ICD-10-CM | POA: Insufficient documentation

## 2023-02-26 DIAGNOSIS — R7303 Prediabetes: Secondary | ICD-10-CM | POA: Diagnosis not present

## 2023-02-26 DIAGNOSIS — Z01818 Encounter for other preprocedural examination: Secondary | ICD-10-CM | POA: Diagnosis present

## 2023-02-26 LAB — BASIC METABOLIC PANEL
Anion gap: 11 (ref 5–15)
BUN: 29 mg/dL — ABNORMAL HIGH (ref 6–20)
CO2: 20 mmol/L — ABNORMAL LOW (ref 22–32)
Calcium: 9.2 mg/dL (ref 8.9–10.3)
Chloride: 104 mmol/L (ref 98–111)
Creatinine, Ser: 0.56 mg/dL (ref 0.44–1.00)
GFR, Estimated: >60 mL/min (ref >60)
Glucose, Bld: 91 mg/dL (ref 70–99)
Potassium: 4 mmol/L (ref 3.5–5.1)
Sodium: 135 mmol/L (ref 135–145)

## 2023-02-26 NOTE — Anesthesia Preprocedure Evaluation (Signed)
Anesthesia Evaluation  Patient identified by MRN, date of birth, ID band Patient awake    Reviewed: Allergy & Precautions, H&P , NPO status , Patient's Chart, lab work & pertinent test results, reviewed documented beta blocker date and time   Airway Mallampati: II  TM Distance: >3 FB Neck ROM: full    Dental no notable dental hx. (+) Dental Advisory Given, Teeth Intact   Pulmonary asthma , sleep apnea  History of acute respiratory failure with hypoxia   breath sounds clear to auscultation + decreased breath sounds      Cardiovascular Exercise Tolerance: Good hypertension, Normal cardiovascular exam Rhythm:Regular Rate:Normal     Neuro/Psych  Headaches, Seizures -,  PSYCHIATRIC DISORDERS  Depression     Neuromuscular disease CVA, Residual Symptoms  negative psych ROS   GI/Hepatic negative GI ROS, Neg liver ROS,,,  Endo/Other  negative endocrine ROS    Renal/GU negative Renal ROS  negative genitourinary   Musculoskeletal  (+) Arthritis , Osteoarthritis,    Abdominal   Peds  Hematology negative hematology ROS (+)   Anesthesia Other Findings   Reproductive/Obstetrics negative OB ROS                             Anesthesia Physical Anesthesia Plan  ASA: 3  Anesthesia Plan: General   Post-op Pain Management: Minimal or no pain anticipated   Induction: Intravenous  PONV Risk Score and Plan: Propofol infusion  Airway Management Planned: Nasal Cannula and Natural Airway  Additional Equipment: None  Intra-op Plan:   Post-operative Plan: Extubation in OR  Informed Consent: I have reviewed the patients History and Physical, chart, labs and discussed the procedure including the risks, benefits and alternatives for the proposed anesthesia with the patient or authorized representative who has indicated his/her understanding and acceptance.     Dental Advisory Given  Plan Discussed with:  CRNA  Anesthesia Plan Comments:         Anesthesia Quick Evaluation

## 2023-03-03 ENCOUNTER — Ambulatory Visit (HOSPITAL_COMMUNITY): Admission: RE | Admit: 2023-03-03 | Payer: 59 | Source: Home / Self Care

## 2023-03-03 ENCOUNTER — Encounter (HOSPITAL_COMMUNITY): Admission: RE | Payer: Self-pay | Source: Home / Self Care

## 2023-03-03 SURGERY — COLONOSCOPY WITH PROPOFOL
Anesthesia: Monitor Anesthesia Care

## 2023-03-03 NOTE — Progress Notes (Signed)
Pt called stated she needed to cancel her appt for her colonoscopy. Stated she was sick. Instructed pt to call office and reschedule. Voiced understanding.

## 2023-03-04 ENCOUNTER — Ambulatory Visit: Payer: 59 | Admitting: Internal Medicine

## 2023-03-11 ENCOUNTER — Ambulatory Visit: Payer: Self-pay | Admitting: Family Medicine

## 2023-03-11 ENCOUNTER — Other Ambulatory Visit: Payer: Self-pay | Admitting: Internal Medicine

## 2023-03-11 DIAGNOSIS — F339 Major depressive disorder, recurrent, unspecified: Secondary | ICD-10-CM

## 2023-03-12 ENCOUNTER — Ambulatory Visit (INDEPENDENT_AMBULATORY_CARE_PROVIDER_SITE_OTHER): Payer: 59

## 2023-03-12 ENCOUNTER — Encounter: Payer: Self-pay | Admitting: Internal Medicine

## 2023-03-12 VITALS — Ht 62.0 in | Wt 186.0 lb

## 2023-03-12 DIAGNOSIS — Z1231 Encounter for screening mammogram for malignant neoplasm of breast: Secondary | ICD-10-CM

## 2023-03-12 DIAGNOSIS — Z0000 Encounter for general adult medical examination without abnormal findings: Secondary | ICD-10-CM | POA: Diagnosis not present

## 2023-03-12 NOTE — Patient Instructions (Signed)
Patty [name] , Thank you for taking time to come for your Medicare Wellness Visit. I appreciate your ongoing commitment to your health goals. Please review the following plan we discussed and let me know if I can assist you in the future.   Referrals/Orders/Follow-Ups/Clinician Recommendations:  Next Medicare Annual Wellness Visit: March 15, 2024 at 10:40 am virtual visit  You have an order for:  []   2D Mammogram  [x]   3D Mammogram  []   Bone Density   []   Lung Cancer Screening  Please call for appointment:   [facility] [address]iology [place], [place] [address] [phone]  Make sure to wear two-piece clothing.  No lotions powders or deodorants the day of the appointment Make sure to bring picture ID and insurance card.  Bring list of medications you are currently taking including any supplements.   Schedule your [place] screening mammogram through MyChart!   Log into your MyChart account.  Go to 'Visit' (or 'Appointments' if on mobile App) --> Schedule an Appointment  Under 'Select a Reason for Visit' choose the Mammogram Screening option.  Complete the pre-visit questions and select the time and place that best fits your schedule.    This is a list of the screening recommended for you and due dates:  Health Maintenance  Topic Date Due   DTaP/Tdap/Td vaccine (1 - Tdap) Never done   Colon Cancer Screening  12/23/2021   Zoster (Shingles) Vaccine (2 of 2) 08/26/2022   Flu Shot  10/31/2022   COVID-19 Vaccine (4 - 2023-24 season) 12/01/2022   Mammogram  03/22/2023   Medicare Annual Wellness Visit  03/11/2024   Pap with HPV screening  11/01/2026   Hepatitis C Screening  Completed   HIV Screening  Completed   HPV Vaccine  Aged Out    Advanced directives: (Declined) Advance directive discussed with you today. Even though you declined this today, please call our office should you change your mind, and we can give you the proper  paperwork for you to fill out.  Next Medicare Annual Wellness Visit scheduled for next year: Yes  Preventive Care 37-34 Years Old, Female Preventive care refers to lifestyle choices and visits with your health care provider that can promote health and wellness. Preventive care visits are also called wellness exams. What can I expect for my preventive care visit? Counseling Your health care provider may ask you questions about your: Medical history, including: Past medical problems. Family medical history. Pregnancy history. Current health, including: Menstrual cycle. Method of birth control. Emotional well-being. Home life and relationship well-being. Sexual activity and sexual health. Lifestyle, including: Alcohol, nicotine or tobacco, and drug use. Access to firearms. Diet, exercise, and sleep habits. Work and work Astronomer. Sunscreen use. Safety issues such as seatbelt and bike helmet use. Physical exam Your health care provider will check your: Height and weight. These may be used to calculate your BMI (body mass index). BMI is a measurement that tells if you are at a healthy weight. Waist circumference. This measures the distance around your waistline. This measurement also tells if you are at a healthy weight and may help predict your risk of certain diseases, such as type 2 diabetes and high blood pressure. Heart rate and blood pressure. Body temperature. Skin for abnormal spots. What immunizations do I need?  Vaccines are usually given at various ages, according to a schedule. Your health care provider will recommend vaccines for you based on your age, medical  history, and lifestyle or other factors, such as travel or where you work. What tests do I need? Screening Your health care provider may recommend screening tests for certain conditions. This may include: Lipid and cholesterol levels. Diabetes screening. This is done by checking your blood sugar (glucose)  after you have not eaten for a while (fasting). Pelvic exam and Pap test. Hepatitis B test. Hepatitis C test. HIV (human immunodeficiency virus) test. STI (sexually transmitted infection) testing, if you are at risk. Lung cancer screening. Colorectal cancer screening. Mammogram. Talk with your health care provider about when you should start having regular mammograms. This may depend on whether you have a family history of breast cancer. BRCA-related cancer screening. This may be done if you have a family history of breast, ovarian, tubal, or peritoneal cancers. Bone density scan. This is done to screen for osteoporosis. Talk with your health care provider about your test results, treatment options, and if necessary, the need for more tests. Follow these instructions at home: Eating and drinking  Eat a diet that includes fresh fruits and vegetables, whole grains, lean protein, and low-fat dairy products. Take vitamin and mineral supplements as recommended by your health care provider. Do not drink alcohol if: Your health care provider tells you not to drink. You are pregnant, may be pregnant, or are planning to become pregnant. If you drink alcohol: Limit how much you have to 0-1 drink a day. Know how much alcohol is in your drink. In the [place] one drink equals one 12 oz bottle of beer (355 mL), one 5 oz glass of wine (148 mL), or one 1 oz glass of hard liquor (44 mL). Lifestyle Brush your teeth every morning and night with fluoride toothpaste. Floss one time each day. Exercise for at least 30 minutes 5 or more days each week. Do not use any products that contain nicotine or tobacco. These products include cigarettes, chewing tobacco, and vaping devices, such as e-cigarettes. If you need help quitting, ask your health care provider. Do not use drugs. If you are sexually active, practice safe sex. Use a condom or other form of protection to prevent STIs. If you do not wish to become  pregnant, use a form of birth control. If you plan to become pregnant, see your health care provider for a prepregnancy visit. Take aspirin only as told by your health care provider. Make sure that you understand how much to take and what form to take. Work with your health care provider to find out whether it is safe and beneficial for you to take aspirin daily. Find healthy ways to manage stress, such as: Meditation, yoga, or listening to music. Journaling. Talking to a trusted person. Spending time with friends and family. Minimize exposure to UV radiation to reduce your risk of skin cancer. Safety Always wear your seat belt while driving or riding in a vehicle. Do not drive: If you have been drinking alcohol. Do not ride with someone who has been drinking. When you are tired or distracted. While texting. If you have been using any mind-altering substances or drugs. Wear a helmet and other protective equipment during sports activities. If you have firearms in your house, make sure you follow all gun safety procedures. Seek help if you have been physically or sexually abused. What's next? Visit your health care provider once a year for an annual wellness visit. Ask your health care provider how often you should have your eyes and teeth checked. Stay up to date  on all vaccines. This information is not intended to replace advice given to you by your health care provider. Make sure you discuss any questions you have with your health care provider. Document Revised: 09/13/2020 Document Reviewed: 09/13/2020 Elsevier Patient Education  2024 [employer]. Understanding Your Risk for Falls Millions of people have serious injuries from falls each year. It is important to understand your risk of falling. Talk with your health care provider about your risk and what you can do to lower it. If you do have a serious fall, make sure to tell your provider. Falling once raises your risk of falling  again. How can falls affect me? Serious injuries from falls are common. These include: Broken bones, such as hip fractures. Head injuries, such as traumatic brain injuries (TBI) or concussions. A fear of falling can cause you to avoid activities and stay at home. This can make your muscles weaker and raise your risk for a fall. What can increase my risk? There are a number of risk factors that increase your risk for falling. The more risk factors you have, the higher your risk of falling. Serious injuries from a fall happen most often to people who are older than 58 years old. Teenagers and young adults ages 28-29 are also at higher risk. Common risk factors include: Weakness in the lower body. Being generally weak or confused due to long-term (chronic) illness. Dizziness or balance problems. Poor vision. Medicines that cause dizziness or drowsiness. These may include: Medicines for your blood pressure, heart, anxiety, insomnia, or swelling (edema). Pain medicines. Muscle relaxants. Other risk factors include: Drinking alcohol. Having had a fall in the past. Having foot pain or wearing improper footwear. Working at a dangerous job. Having any of the following in your home: Tripping hazards, such as floor clutter or loose rugs. Poor lighting. Pets. Having dementia or memory loss. What actions can I take to lower my risk of falling?     Physical activity Stay physically fit. Do strength and balance exercises. Consider taking a regular class to build strength and balance. Yoga and tai chi are good options. Vision Have your eyes checked every year and your prescription for glasses or contacts updated as needed. Shoes and walking aids Wear non-skid shoes. Wear shoes that have rubber soles and low heels. Do not wear high heels. Do not walk around the house in socks or slippers. Use a cane or walker as told by your provider. Home safety Attach secure railings on both sides of your  stairs. Install grab bars for your bathtub, shower, and toilet. Use a non-skid mat in your bathtub or shower. Attach bath mats securely with double-sided, non-slip rug tape. Use good lighting in all rooms. Keep a flashlight near your bed. Make sure there is a clear path from your bed to the bathroom. Use night-lights. Do not use throw rugs. Make sure all carpeting is taped or tacked down securely. Remove all clutter from walkways and stairways, including extension cords. Repair uneven or broken steps and floors. Avoid walking on icy or slippery surfaces. Walk on the grass instead of on icy or slick sidewalks. Use ice melter to get rid of ice on walkways in the winter. Use a cordless phone. Questions to ask your health care provider Can you help me check my risk for a fall? Do any of my medicines make me more likely to fall? Should I take a vitamin D supplement? What exercises can I do to improve my strength and balance?  Should I make an appointment to have my vision checked? Do I need a bone density test to check for weak bones (osteoporosis)? Would it help to use a cane or a walker? Where to find more information Centers for Disease Control and Prevention, [employer]: TonerPromos.no Community-Based Fall Prevention Programs: TonerPromos.no [employer] on Aging: BaseRingTones.pl Contact a health care provider if: You fall at home. You are afraid of falling at home. You feel weak, drowsy, or dizzy. This information is not intended to replace advice given to you by your health care provider. Make sure you discuss any questions you have with your health care provider. Document Revised: 11/19/2021 Document Reviewed: 11/19/2021 Elsevier Patient Education  2024 [employer].

## 2023-03-12 NOTE — Progress Notes (Addendum)
 Please attest and cosign this visit due to patients primary care provider not being in the office at the time the visit was completed.   Because this visit was a virtual/telehealth visit,  certain criteria was not obtained, such a blood pressure, CBG if applicable, and timed get up and go. Any medications not marked as "taking" were not mentioned during the medication reconciliation part of the visit. Any vitals not documented were not able to be obtained due to this being a telehealth visit or patient was unable to self-report a recent blood pressure reading due to a lack of equipment at home via telehealth. Vitals that have been documented are verbally provided by the patient.   Subjective:   Patty [name] is a 57 y.o. female who presents for Medicare Annual (Subsequent) preventive examination.  Visit Complete: Virtual I connected with  Patty [name] on 03/12/23 by a audio enabled telemedicine application and verified that I am speaking with the correct person using two identifiers.  Patient Location: Home  Provider Location: Home Office  I discussed the limitations of evaluation and management by telemedicine. The patient expressed understanding and agreed to proceed.  Vital Signs: Because this visit was a virtual/telehealth visit, some criteria may be missing or patient reported. Any vitals not documented were not able to be obtained and vitals that have been documented are patient reported.  Patient Medicare AWV questionnaire was completed by the patient on na; I have confirmed that all information answered by patient is correct and no changes since this date.  Cardiac Risk Factors include: advanced age (>18men, >32 women);dyslipidemia;hypertension;sedentary lifestyle;obesity (BMI >30kg/m2)     Objective:    Today's Vitals   03/12/23 1522 03/12/23 1523  Weight: 186 lb (84.4 kg)   Height: 5\' 2"  (1.575 m)   PainSc:  0-No pain   Body mass index is 34.02 kg/m.     03/12/2023     3:22 PM 02/26/2023    9:19 AM 09/08/2022    6:44 PM 01/08/2022   11:34 AM 12/25/2020    2:42 PM 07/06/2017   12:14 PM 07/18/2016    1:12 PM  Advanced Directives  Does Patient Have a Medical Advance Directive? No No No Yes No No No  Does patient want to make changes to medical advance directive?    Yes (MAU/Ambulatory/Procedural Areas - Information given)     Would patient like information on creating a medical advance directive? No - Patient declined No - Patient declined   No - Patient declined No - Patient declined No - Patient declined    Current Medications (verified) Outpatient Encounter Medications as of 03/12/2023  Medication Sig   albuterol (ACCUNEB) 0.63 MG/3ML nebulizer solution Take 3 mLs (0.63 mg total) by nebulization every 6 (six) hours as needed for wheezing or shortness of breath.   aspirin EC 81 MG tablet Take 1 tablet (81 mg total) by mouth daily.   budesonide-formoterol (SYMBICORT) 80-4.5 MCG/ACT inhaler INHALE 2 PUFFS BY MOUTH EVERY 12 HOURS   cyclobenzaprine (FLEXERIL) 5 MG tablet Take 1 tablet (5 mg total) by mouth 2 (two) times daily as needed for muscle spasms.   divalproex (DEPAKOTE) 250 MG DR tablet TAKE 1 TABLET(250 MG) BY MOUTH THREE TIMES DAILY   FLUoxetine (PROZAC) 20 MG capsule TAKE 1 CAPSULE(20 MG) BY MOUTH DAILY   gabapentin (NEURONTIN) 100 MG capsule Take 1 capsule (100 mg total) by mouth 2 (two) times daily.   levalbuterol (XOPENEX HFA) 45 MCG/ACT inhaler INHALE 1 TO 2  PUFFS BY MOUTH EVERY 4 TO 6 HOURS AS NEEDED FOR WHEEZING   Nebulizers (MY MDI PORTABLE NEBULISER) MISC 1 application by Does not apply route as needed.   ondansetron (ZOFRAN) 4 MG tablet Take 1 tablet (4 mg total) by mouth every 8 (eight) hours as needed for nausea or vomiting.   pravastatin (PRAVACHOL) 40 MG tablet Take 1 tablet (40 mg total) by mouth daily.   Rimegepant Sulfate (NURTEC) 75 MG TBDP Take 1 tablet (75 mg total) by mouth every other day.   UNABLE TO FIND 1 application by Other  route daily. Wrist splint for carpal tunnel syndrome: Apply it daily.   verapamil (CALAN) 120 MG tablet TAKE 1 TABLET(120 MG) BY MOUTH DAILY   No facility-administered encounter medications on file as of 03/12/2023.    Allergies (verified) Bee venom   History: Past Medical History:  Diagnosis Date   Acute respiratory failure with hypoxia (HCC) 10/18/2014   Asthma    Cholecystitis with cholelithiasis 07/12/2014   Cholelithiasis with acute cholecystitis 07/13/2014   DDD (degenerative disc disease), lumbar    Depression    HTN (hypertension)    Hx of scabies    Hyperlipidemia    Rectal bleed 11/13/2011   Seizures (HCC)    remote   Stroke [facility]) 1990   chronic right hemiparesis   Past Surgical History:  Procedure Laterality Date   CESAREAN SECTION     CHOLECYSTECTOMY N/A 07/13/2014   Procedure: LAPAROSCOPIC CONVERTED TO OPEN  CHOLECYSTECTOMY ;  Surgeon: Darnell Level, MD;  Location: [facility];  Service: General;  Laterality: N/A;   COLONOSCOPY  12/24/2011   Procedure: COLONOSCOPY;  Surgeon: West Bali, MD;  Location: [facility] SU[facility];  Service: Endoscopy;  Laterality: N/A;  2:00   GALLBLADDER SURGERY     [name] /[facility]   PARTIAL HYSTERECTOMY     heavy bleeding   Family History  Problem Relation Age of Onset   Stroke Mother    Heart attack Mother    Stroke Father    Diabetes Other    Colon cancer Neg Hx    Liver disease Neg Hx    Social History   Socioeconomic History   Marital status: Divorced    Spouse name: Not on file   Number of children: 2   Years of education: Not on file   Highest education level: Not on file  Occupational History   Occupation: disabled    Employer: UNEMPLOYED  Tobacco Use   Smoking status: Never   Smokeless tobacco: Never  Vaping Use   Vaping status: Never Used  Substance and Sexual Activity   Alcohol use: Yes    Alcohol/week: 2.0 standard drinks of alcohol    Types: 2 Glasses of wine per week    Comment: couple of drinks on  weekends   Drug use: No   Sexual activity: Not Currently  Other Topics Concern   Not on file  Social History Narrative   On disability. Used to work at [employer]/Worked at [employer] Retired now   [employer].    Two children.    Daughter: 20    Son: 57   Has 10 grand children.    Enjoys reading      Diet: eats lots of fruits and veggies, occasional meat: chicken mostly, likes pasta    Caffeine: Tea-green lipton   Water: about 5 cups daily      Wears seatbelt   Smoke detectors      Social Determinants of  Health   Financial Resource Strain: Low Risk  (03/12/2023)   Overall Financial Resource Strain (CARDIA)    Difficulty of Paying Living Expenses: Not hard at all  Food Insecurity: No Food Insecurity (03/12/2023)   Hunger Vital Sign    Worried About Running Out of Food in the Last Year: Never true    Ran Out of Food in the Last Year: Never true  Transportation Needs: No Transportation Needs (03/12/2023)   PRAPARE - Administrator, Civil Service (Medical): No    Lack of Transportation (Non-Medical): No  Physical Activity: Patient Declined (03/12/2023)   Exercise Vital Sign    Days of Exercise per Week: Patient declined    Minutes of Exercise per Session: Patient declined  Stress: No Stress Concern Present (03/12/2023)   [employer] of Occupational Health - Occupational Stress Questionnaire    Feeling of Stress : Not at all  Social Connections: Moderately Isolated (03/12/2023)   Social Connection and Isolation Panel [NHANES]    Frequency of Communication with Friends and Family: More than three times a week    Frequency of Social Gatherings with Friends and Family: More than three times a week    Attends Religious Services: More than 4 times per year    Active Member of [employer] or Organizations: No    Attends Banker Meetings: Never    Marital Status: Widowed    Tobacco Counseling Counseling given: Yes   Clinical Intake:  Pre-visit  preparation completed: Yes  Pain : No/denies pain Pain Score: 0-No pain     BMI - recorded: 34.02 Nutritional Status: BMI > 30  Obese Nutritional Risks: None Diabetes: No  How often do you need to have someone help you when you read instructions, pamphlets, or other written materials from your doctor or pharmacy?: 1 - Never  Interpreter Needed?: No  Information entered by ::  Avayah Raffety, CMA   Activities of Daily Living    03/12/2023    3:31 PM 02/26/2023    9:17 AM  In your present state of health, do you have any difficulty performing the following activities:  Hearing? 0   Vision? 0   Difficulty concentrating or making decisions? 0   Walking or climbing stairs? 1   Comment uses cane/walker   Dressing or bathing? 0   Doing errands, shopping? 1 0  Comment home health aid helps patient   Preparing Food and eating ? N   Using the Toilet? N   In the past six months, have you accidently leaked urine? N   Do you have problems with loss of bowel control? N   Managing your Medications? N   Managing your Finances? N   Housekeeping or managing your Housekeeping? Y   Comment patients HH aid assists with house chores     Patient Care Team: Anabel Halon, MD as PCP - General (Internal Medicine) Laqueta Linden, MD (Inactive) as PCP - Cardiology (Cardiology) West Bali, MD (Inactive) (Gastroenterology)  Indicate any recent Medical Services you may have received from other than [facility] providers in the past year (date may be approximate).     Assessment:   This is a routine wellness examination for Jalasia.  Hearing/Vision screen Hearing Screening - Comments:: Patient denies any hearing difficulties.   Vision Screening - Comments:: Wears rx glasses - up to date with routine eye exams  Sees Dr. [facility]   Goals Addressed  This Visit's Progress    Patient Stated       To improve health       Depression Screen     03/12/2023    3:26 PM 10/29/2022    4:06 PM 09/18/2022    9:21 AM 06/05/2022    2:57 PM 05/15/2022   10:48 AM 02/05/2022   10:47 AM 10/31/2021   10:04 AM  PHQ 2/9 Scores  PHQ - 2 Score 0 0 0 0 0 1 0  PHQ- 9 Score 0          Fall Risk    03/12/2023    3:29 PM 10/29/2022    4:05 PM 09/18/2022    9:21 AM 06/05/2022    2:57 PM 05/15/2022   10:48 AM  Fall Risk   Falls in the past year? 1 0 0 0 0  Number falls in past yr: 1 0 0 0 0  Injury with Fall? 0 0 0 0 0  Risk for fall due to : History of fall(s);Impaired balance/gait;Impaired mobility      Follow up Education provided;Falls prevention discussed        MEDICARE RISK AT HOME: Medicare Risk at Home Any stairs in or around the home?: Yes If so, are there any without handrails?: No Home free of loose throw rugs in walkways, pet beds, electrical cords, etc?: Yes Adequate lighting in your home to reduce risk of falls?: Yes Life alert?: No Use of a cane, walker or w/c?: Yes Grab bars in the bathroom?: No Shower chair or bench in shower?: No Elevated toilet seat or a handicapped toilet?: No  TIMED UP AND GO:  Was the test performed?  No    Cognitive Function:        03/12/2023    3:28 PM 01/08/2022   11:39 AM 12/25/2020    2:48 PM  6CIT Screen  What Year? 0 points 0 points 0 points  What month? 0 points 0 points 0 points  What time? 0 points 0 points 0 points  Count back from 20 0 points 0 points 0 points  Months in reverse 0 points 2 points 2 points  Repeat phrase 0 points 0 points 2 points  Total Score 0 points 2 points 4 points    Immunizations Immunization History  Administered Date(s) Administered   Influenza,inj,Quad PF,6+ Mos 04/25/2017, 01/02/2021, 12/20/2021   Moderna Sars-Covid-2 Vaccination 11/05/2019, 12/05/2019, 05/17/2020   Zoster Recombinant(Shingrix) 07/01/2022    TDAP status: Due, Education has been provided regarding the importance of this vaccine. Advised may receive this vaccine at local pharmacy or  Health Dept. Aware to provide a copy of the vaccination record if obtained from local pharmacy or Health Dept. Verbalized acceptance and understanding.  Flu Vaccine status: Due, Education has been provided regarding the importance of this vaccine. Advised may receive this vaccine at local pharmacy or Health Dept. Aware to provide a copy of the vaccination record if obtained from local pharmacy or Health Dept. Verbalized acceptance and understanding.  Pneumococcal vaccine status: Not age appropriate for this patient.    Covid-19 vaccine status: Information provided on how to obtain vaccines.   Qualifies for Shingles Vaccine? Yes   Zostavax completed No   Shingrix Completed?: No.    Education has been provided regarding the importance of this vaccine. Patient has been advised to call insurance company to determine out of pocket expense if they have not yet received this vaccine. Advised may also receive vaccine at local pharmacy or Health  Dept. Verbalized acceptance and understanding.  Screening Tests Health Maintenance  Topic Date Due   DTaP/Tdap/Td (1 - Tdap) Never done   Colonoscopy  12/23/2021   Zoster Vaccines- Shingrix (2 of 2) 08/26/2022   INFLUENZA VACCINE  10/31/2022   COVID-19 Vaccine (4 - 2023-24 season) 12/01/2022   Medicare Annual Wellness (AWV)  01/09/2023   MAMMOGRAM  03/22/2023   Cervical Cancer Screening (HPV/Pap Cotest)  11/01/2026   Hepatitis C Screening  Completed   HIV Screening  Completed   HPV VACCINES  Aged Out    Health Maintenance  Health Maintenance Due  Topic Date Due   DTaP/Tdap/Td (1 - Tdap) Never done   Colonoscopy  12/23/2021   Zoster Vaccines- Shingrix (2 of 2) 08/26/2022   INFLUENZA VACCINE  10/31/2022   COVID-19 Vaccine (4 - 2023-24 season) 12/01/2022   Medicare Annual Wellness (AWV)  01/09/2023    Colorectal Cancer Screening:: Patient was scheduled to have procedure but had to cancel due to being sick.She will call office to  reschedule.  Mammogram status: Ordered 03/12/2023. Pt provided with contact info and advised to call to schedule appt.   Bone Density Screening: Not age appropriate for this patient.    Lung Cancer Screening: (Low Dose CT Chest recommended if Age 85-80 years, 20 pack-year currently smoking OR have quit w/in 15years.) does not qualify.   Lung Cancer Screening Referral: na  Additional Screening:  Hepatitis C Screening: does not qualify; Completed   Vision Screening: Recommended annual ophthalmology exams for early detection of glaucoma and other disorders of the eye. Is the patient up to date with their annual eye exam?  Yes  Who is the provider or what is the name of the office in which the patient attends annual eye exams? Daisy Lazar If pt is not established with a provider, would they like to be referred to a provider to establish care? No .   Dental Screening: Recommended annual dental exams for proper oral hygiene  Diabetic Foot Exam: na  Community Resource Referral / Chronic Care Management: CRR required this visit?  No   CCM required this visit?  No     Plan:     I have personally reviewed and noted the following in the patient's chart:   Medical and social history Use of alcohol, tobacco or illicit drugs  Current medications and supplements including opioid prescriptions. Patient is not currently taking opioid prescriptions. Functional ability and status Nutritional status Physical activity Advanced directives List of other physicians Hospitalizations, surgeries, and ER visits in previous 12 months Vitals Screenings to include cognitive, depression, and falls Referrals and appointments  In addition, I have reviewed and discussed with patient certain preventive protocols, quality metrics, and best practice recommendations. A written personalized care plan for preventive services as well as general preventive health recommendations were provided to patient.      Jordan Hawks Kasy Iannacone, CMA   03/12/2023   After Visit Summary: (Mail) Due to this being a telephonic visit, the after visit summary with patients personalized plan was offered to patient via mail   Nurse Notes: see routing comment

## 2023-03-13 ENCOUNTER — Encounter: Payer: Self-pay | Admitting: Internal Medicine

## 2023-03-27 ENCOUNTER — Ambulatory Visit: Payer: 59 | Admitting: Internal Medicine

## 2023-04-03 ENCOUNTER — Ambulatory Visit: Payer: Self-pay | Admitting: Internal Medicine

## 2023-04-03 ENCOUNTER — Encounter (HOSPITAL_COMMUNITY): Payer: Self-pay

## 2023-04-03 ENCOUNTER — Other Ambulatory Visit: Payer: Self-pay

## 2023-04-03 ENCOUNTER — Emergency Department (HOSPITAL_COMMUNITY)
Admission: EM | Admit: 2023-04-03 | Discharge: 2023-04-03 | Disposition: A | Discharge status: Home / Self Care | Payer: 59 | Attending: Emergency Medicine | Admitting: Emergency Medicine

## 2023-04-03 DIAGNOSIS — K148 Other diseases of tongue: Secondary | ICD-10-CM | POA: Insufficient documentation

## 2023-04-03 DIAGNOSIS — K149 Disease of tongue, unspecified: Secondary | ICD-10-CM

## 2023-04-03 DIAGNOSIS — Z7982 Long term (current) use of aspirin: Secondary | ICD-10-CM | POA: Diagnosis not present

## 2023-04-03 DIAGNOSIS — K0889 Other specified disorders of teeth and supporting structures: Secondary | ICD-10-CM | POA: Insufficient documentation

## 2023-04-03 LAB — GROUP A STREP BY PCR: Group A Strep by PCR: NOT DETECTED

## 2023-04-03 MED ORDER — ALUM & MAG HYDROXIDE-SIMETH 200-200-20 MG/5ML PO SUSP
30.0000 mL | Freq: Once | ORAL | Status: AC
  Administered 2023-04-03: 30 mL via ORAL
  Filled 2023-04-03: qty 30

## 2023-04-03 MED ORDER — PENICILLIN V POTASSIUM 500 MG PO TABS: 500.0000 mg | ORAL_TABLET | Freq: Four times a day (QID) | ORAL | 0 refills | Status: AC

## 2023-04-03 MED ORDER — LIDOCAINE VISCOUS HCL 2 % MT SOLN
15.0000 mL | Freq: Once | OROMUCOSAL | Status: AC
  Administered 2023-04-03: 15 mL via ORAL
  Filled 2023-04-03: qty 15

## 2023-04-03 NOTE — ED Provider Notes (Signed)
 [place] EMERGENCY DEPARTMENT AT [facility] Provider Note   CSN: [id number] Arrival date & time: 04/03/23  1032     History  Chief Complaint  Patient presents with   Oral Swelling    Patty [name] is a 58 y.o. female.  She is brought in by EMS from home for tongue swelling.  She said it has been giving her troubles for 3 days.  It started with a hole in her tooth that was hurting.  She has been using Tylenol  and gabapentin  for the pain.  She started doing hydrogen peroxide rinses which may have aggravated her tongue.  She is still able to swallow although she said her tongue hurts when she swallows.  No difficulty breathing.  No facial swelling.  EMS gave her Benadryl.  The history is provided by the patient.  Dental Pain Location:  Lower Lower teeth location:  19/LL 1st molar Quality:  Aching Severity:  Moderate Onset quality:  Gradual Duration:  3 days Progression:  Unchanged Context: cap fell off   Associated symptoms: difficulty swallowing   Associated symptoms: no fever, no neck swelling and no trismus        Home Medications Prior to Admission medications   Medication Sig Start Date End Date Taking? Authorizing Provider  albuterol  (ACCUNEB ) 0.63 MG/3ML nebulizer solution Take 3 mLs (0.63 mg total) by nebulization every 6 (six) hours as needed for wheezing or shortness of breath. 02/05/22   Tobie Suzzane POUR, MD  aspirin  EC 81 MG tablet Take 1 tablet (81 mg total) by mouth daily. 04/25/17   Rolinda Millman, MD  budesonide -formoterol  (SYMBICORT ) 80-4.5 MCG/ACT inhaler INHALE 2 PUFFS BY MOUTH EVERY 12 HOURS 02/25/23   Tobie Suzzane POUR, MD  cyclobenzaprine  (FLEXERIL ) 5 MG tablet Take 1 tablet (5 mg total) by mouth 2 (two) times daily as needed for muscle spasms. 09/18/22   Tobie Suzzane POUR, MD  divalproex  (DEPAKOTE ) 250 MG DR tablet TAKE 1 TABLET(250 MG) BY MOUTH THREE TIMES DAILY 01/07/23   Ines Onetha NOVAK, MD  FLUoxetine  (PROZAC ) 20 MG capsule TAKE 1 CAPSULE(20 MG) BY  MOUTH DAILY 03/11/23   Tobie Suzzane POUR, MD  gabapentin  (NEURONTIN ) 100 MG capsule Take 1 capsule (100 mg total) by mouth 2 (two) times daily. 09/18/22   Tobie Suzzane POUR, MD  levalbuterol  (XOPENEX  HFA) 45 MCG/ACT inhaler INHALE 1 TO 2 PUFFS BY MOUTH EVERY 4 TO 6 HOURS AS NEEDED FOR WHEEZING 09/04/22   Tobie Suzzane POUR, MD  Nebulizers (MY MDI PORTABLE NEBULISER) MISC 1 application by Does not apply route as needed. 05/08/20   Tobie Suzzane POUR, MD  ondansetron  (ZOFRAN ) 4 MG tablet Take 1 tablet (4 mg total) by mouth every 8 (eight) hours as needed for nausea or vomiting. 07/20/21   Zarwolo, Gloria, FNP  pravastatin  (PRAVACHOL ) 40 MG tablet Take 1 tablet (40 mg total) by mouth daily. 10/30/22   Tobie Suzzane POUR, MD  Rimegepant Sulfate (NURTEC) 75 MG TBDP Take 1 tablet (75 mg total) by mouth every other day. 06/05/22   Tobie Suzzane POUR, MD  UNABLE TO FIND 1 application by Other route daily. Wrist splint for carpal tunnel syndrome: Apply it daily. 05/08/20   Tobie Suzzane POUR, MD  verapamil  (CALAN ) 120 MG tablet TAKE 1 TABLET(120 MG) BY MOUTH DAILY 01/03/23   Tobie Suzzane POUR, MD      Allergies    Bee venom    Review of Systems   Review of Systems  Constitutional:  Negative for fever.  HENT:  Positive for dental problem, sore throat and trouble swallowing.   Respiratory:  Negative for shortness of breath.   Cardiovascular:  Negative for chest pain.    Physical Exam Updated Vital Signs BP (!) 147/85 (BP Location: Left Arm)   Pulse 66   Temp 98.3 F (36.8 C) (Oral)   SpO2 96%  Physical Exam Vitals and nursing note reviewed.  Constitutional:      General: She is not in acute distress.    Appearance: Normal appearance. She is well-developed.  HENT:     Head: Normocephalic and atraumatic.     Mouth/Throat:     Mouth: Mucous membranes are moist.     Pharynx: Oropharynx is clear. No oropharyngeal exudate.     Comments: I do not see any particular swelling of her tongue.  Her voice is normal.  There is no  stridor and no trismus.  No facial swelling. Eyes:     Conjunctiva/sclera: Conjunctivae normal.  Cardiovascular:     Rate and Rhythm: Normal rate and regular rhythm.     Heart sounds: No murmur heard. Pulmonary:     Effort: Pulmonary effort is normal. No respiratory distress.     Breath sounds: Normal breath sounds.  Abdominal:     Palpations: Abdomen is soft.     Tenderness: There is no abdominal tenderness.  Musculoskeletal:        General: No deformity.     Cervical back: Neck supple.  Skin:    General: Skin is warm and dry.     Capillary Refill: Capillary refill takes less than 2 seconds.  Neurological:     General: No focal deficit present.     Mental Status: She is alert.     ED Results / Procedures / Treatments   Labs (all labs ordered are listed, but only abnormal results are displayed) Labs Reviewed - No data to display  EKG EKG Interpretation Date/Time:  Thursday April 03 2023 10:35:38 EST Ventricular Rate:  67 PR Interval:  143 QRS Duration:  80 QT Interval:  408 QTC Calculation: 431 R Axis:   27  Text Interpretation: Sinus rhythm Low voltage, precordial leads Borderline T abnormalities, anterior leads No significant change since prior 11/24 Confirmed by Towana Sharper [phone]) on 04/03/2023 10:38:30 AM  Radiology No results found.  Procedures Procedures    Medications Ordered in ED Medications  alum & mag hydroxide-simeth (MAALOX/MYLANTA) 200-200-20 MG/5ML suspension 30 mL (has no administration in time range)    And  lidocaine  (XYLOCAINE ) 2 % viscous mouth solution 15 mL (has no administration in time range)    ED Course/ Medical Decision Making/ A&P                                 Medical Decision Making Risk OTC drugs. Prescription drug management.   This patient complains of swelling and pain of the tongue and mouth dental pain; this involves an extensive number of treatment Options and is a complaint that carries with it a high risk  of complications and morbidity. The differential includes dental infection, abscess, angioedema I ordered medication GI cocktail and reviewed PMP when indicated. Additional history obtained from EMS Previous records obtained and reviewed in epic no recent admissions Social determinants considered, no significant barriers Critical Interventions: None  After the interventions stated above, I reevaluated the patient and found patient's symptoms to be improved and no evidence of airway compromise Admission and  further testing considered, no indications for admission or further workup at this time.  Will continue symptomatic treatment and will cover with antibiotics.  Return instructions discussed         Final Clinical Impression(s) / ED Diagnoses Final diagnoses:  Pain, dental  Tongue irritation    Rx / DC Orders ED Discharge Orders          Ordered    penicillin  v potassium (VEETID) 500 MG tablet  4 times daily        04/03/23 1225              Towana Ozell BROCKS, MD 04/03/23 2055

## 2023-04-03 NOTE — Telephone Encounter (Signed)
 Copied from CRM [id number]. Topic: Clinical - Red Word Triage >> Apr 03, 2023  9:22 AM Nestora J wrote: [facility] that prompted transfer to Nurse Triage: Swollen tongue  Chief Complaint: tongue swelling Symptoms: tongue swollen Frequency: constant Pertinent Negatives: Patient denies Na Disposition: [x] ED /[] Urgent Care (no appt availability in office) / [] Appointment(In office/virtual)/ []  [facility]/ [] Home Care/ [] Refused Recommended Disposition /[] [facility]/ []  Follow-up with PCP Additional Notes: patient states tongue swollen, states had bad tooth and tongue infected.  Swollen tongue effecting speech.  Instructed to go to ER.  Reason for Disposition  Can't swallow normal secretions (e.g., drooling or spitting)  Answer Assessment - Initial Assessment Questions 1. ONSET: When did the swelling start? (e.g., minutes, hours, days)     3-4 days 2. LOCATION: What part of the tongue is swollen?     Whole tongue 3. SEVERITY: How swollen is it?     Effecting speech 4. CAUSE: What do you think is causing the tongue swelling? (e.g., history of angioedema, allergies)     infection 5. RECURRENT SYMPTOM: Have you had tongue swelling before? If Yes, ask: When was the last time? What happened that time?     denies 6. OTHER SYMPTOMS: Do you have any other symptoms? (e.g., breathing difficulty, facial swelling)     Denies.  Protocols used: Tongue Swelling-A-AH

## 2023-04-03 NOTE — ED Triage Notes (Signed)
 Pt states she has had tongue swelling and her chin and neck are hurting on the left side for 2-3 days. Pt takes gabapentin  daily. Hx of stroke in the past leaving pt with difficulty speaking at times since. Pt states she takes blood pressure medicine but does not know if it is for hugh or low BP

## 2023-04-03 NOTE — Discharge Instructions (Signed)
 Please stop using the hydrogen peroxide.  You can use warm salt water rinses.  Tylenol and ibuprofen for pain.  Antibiotics.  Follow-up with your dentist as soon as possible.  Return if any worsening or concerning symptoms

## 2023-04-09 ENCOUNTER — Other Ambulatory Visit: Payer: Self-pay | Admitting: Neurology

## 2023-04-09 DIAGNOSIS — R569 Unspecified convulsions: Secondary | ICD-10-CM

## 2023-04-09 DIAGNOSIS — I6389 Other cerebral infarction: Secondary | ICD-10-CM

## 2023-04-09 DIAGNOSIS — G8191 Hemiplegia, unspecified affecting right dominant side: Secondary | ICD-10-CM

## 2023-04-11 ENCOUNTER — Other Ambulatory Visit: Payer: Self-pay | Admitting: Internal Medicine

## 2023-04-11 DIAGNOSIS — G5603 Carpal tunnel syndrome, bilateral upper limbs: Secondary | ICD-10-CM

## 2023-04-11 DIAGNOSIS — F339 Major depressive disorder, recurrent, unspecified: Secondary | ICD-10-CM

## 2023-04-15 ENCOUNTER — Telehealth: Payer: Self-pay

## 2023-04-15 NOTE — Progress Notes (Signed)
 Transition Care Management Unsuccessful Follow-up Telephone Call  Date of discharge and from where:  [name]/2  Attempts:  2nd Attempt  Reason for unsuccessful TCM follow-up call:  No answer/busy   [facility]  [facility], [facility] Guide, Phone: [phone] Website: [email or web address]

## 2023-04-15 NOTE — Progress Notes (Signed)
 Transition Care Management Unsuccessful Follow-up Telephone Call  Date of discharge and from where:  [name] 1/2  Attempts:  1st Attempt  Reason for unsuccessful TCM follow-up call:  No answer/busy   [facility]  [facility], [facility] Guide, Phone: [phone] Website: [email or web address]

## 2023-04-21 ENCOUNTER — Ambulatory Visit: Payer: Self-pay | Admitting: Internal Medicine

## 2023-04-21 NOTE — Telephone Encounter (Signed)
Copied from CRM [id number]. Topic: Clinical - Red Word Triage >> Apr 21, 2023 12:50 PM Gildardo Pounds wrote: Red Word that prompted transfer to Nurse Triage: Patient has constant pain in right side for a few weeks with a pain level of 7 right now; and real back cold. Callback number [phone]   Chief Complaint: Cough Symptoms: Cough, right sided pain Frequency: Frequent cough, constant pain Pertinent Negatives: Patient denies shortness of breath, fever Disposition: [x] ED /[] Urgent Care (no appt availability in office) / [] Appointment(In office/virtual)/ []  [facility]/ [] Home Care/ [] Refused Recommended Disposition /[] [facility]/ []  Follow-up with PCP Additional Notes: Patient reports that she has had a cough for the last few weeks and has recently had pain in her right side. She states the cough is productive of green sputum. She states that her pain is 7/10 and is on her right side in her flank and ribs. Patient denies any fever or shortness of breath. Patient advised that due to her constant chest pain she should be evaluated at the ED to rule out pneumonia or any other more serious medical condition. Patient understood and is agreeable with plan.     Reason for Disposition  Chest pain  (Exception: MILD central chest pain, present only when coughing.)  Answer Assessment - Initial Assessment Questions 1. ONSET: "When did the cough begin?"      "A few weeks" 2. SEVERITY: "How bad is the cough today?"      Mild to moderate  3. SPUTUM: "Describe the color of your sputum" (none, dry cough; clear, white, yellow, green)     Dark green 4. HEMOPTYSIS: "Are you coughing up any blood?" If so ask: "How much?" (flecks, streaks, tablespoons, etc.)     No 5. DIFFICULTY BREATHING: "Are you having difficulty breathing?" If Yes, ask: "How bad is it?" (e.g., mild, moderate, severe)    - MILD: No SOB at rest, mild SOB with walking, speaks normally in sentences, can lie down, no  retractions, pulse < 100.    - MODERATE: SOB at rest, SOB with minimal exertion and prefers to sit, cannot lie down flat, speaks in phrases, mild retractions, audible wheezing, pulse 100-120.    - SEVERE: Very SOB at rest, speaks in single words, struggling to breathe, sitting hunched forward, retractions, pulse > 120      No 6. FEVER: "Do you have a fever?" If Yes, ask: "What is your temperature, how was it measured, and when did it start?"     No 7. CARDIAC HISTORY: "Do you have any history of heart disease?" (e.g., heart attack, congestive heart failure)      No 8. LUNG HISTORY: "Do you have any history of lung disease?"  (e.g., pulmonary embolus, asthma, emphysema)     Asthma  9. PE RISK FACTORS: "Do you have a history of blood clots?" (or: recent major surgery, recent prolonged travel, bedridden)     No 10. OTHER SYMPTOMS: "Do you have any other symptoms?" (e.g., runny nose, wheezing, chest pain)       Pain in right side with cough  11. PREGNANCY: "Is there any chance you are pregnant?" "When was your last menstrual period?"       No 12. TRAVEL: "Have you traveled out of the country in the last month?" (e.g., travel history, exposures)       No  Protocols used: Cough - Acute Productive-A-AH

## 2023-04-24 ENCOUNTER — Other Ambulatory Visit (HOSPITAL_COMMUNITY): Payer: Self-pay | Admitting: Internal Medicine

## 2023-04-24 DIAGNOSIS — Z1231 Encounter for screening mammogram for malignant neoplasm of breast: Secondary | ICD-10-CM

## 2023-04-24 DIAGNOSIS — Z0000 Encounter for general adult medical examination without abnormal findings: Secondary | ICD-10-CM

## 2023-05-02 ENCOUNTER — Ambulatory Visit (INDEPENDENT_AMBULATORY_CARE_PROVIDER_SITE_OTHER): Payer: 59 | Admitting: Internal Medicine

## 2023-05-02 ENCOUNTER — Encounter: Payer: Self-pay | Admitting: Internal Medicine

## 2023-05-02 VITALS — BP 108/75 | HR 71 | Ht 62.0 in | Wt 189.2 lb

## 2023-05-02 DIAGNOSIS — I69359 Hemiplegia and hemiparesis following cerebral infarction affecting unspecified side: Secondary | ICD-10-CM

## 2023-05-02 DIAGNOSIS — G43719 Chronic migraine without aura, intractable, without status migrainosus: Secondary | ICD-10-CM

## 2023-05-02 DIAGNOSIS — I10 Essential (primary) hypertension: Secondary | ICD-10-CM | POA: Diagnosis not present

## 2023-05-02 DIAGNOSIS — F339 Major depressive disorder, recurrent, unspecified: Secondary | ICD-10-CM

## 2023-05-02 DIAGNOSIS — J4540 Moderate persistent asthma, uncomplicated: Secondary | ICD-10-CM | POA: Diagnosis not present

## 2023-05-02 DIAGNOSIS — M5412 Radiculopathy, cervical region: Secondary | ICD-10-CM

## 2023-05-02 DIAGNOSIS — Z09 Encounter for follow-up examination after completed treatment for conditions other than malignant neoplasm: Secondary | ICD-10-CM | POA: Diagnosis not present

## 2023-05-02 MED ORDER — FLUOXETINE HCL 20 MG PO CAPS: 20.0000 mg | ORAL_CAPSULE | Freq: Every day | ORAL | 5 refills | Status: DC

## 2023-05-02 NOTE — Progress Notes (Signed)
Established Patient Office Visit  Subjective:  Patient ID: Patty [name], female    DOB: 1965/12/23  Age: 58 y.o. MRN: [id number]  CC:  Chief Complaint  Patient presents with   Care Management    Hospital f/u    Hypertension    HPI Patty [name] is a 58 y.o. female with past medical history of HTN, CVA, OSA, asthma, seizure disorder, HLD, depression and morbid obesity who presents for f/u of her chronic medical conditions and recent ER visit.  She went to ER on 04/03/2023 for left-sided jaw and oral cavity pain.  She has a dental abscess, was given penicillin by ER.  She has an appointment with dental surgeon today.  She still complains of left-sided jaw pain.  Denies any fever or chills.  HTN: BP is well-controlled. Takes verapamil 120 mg QD regularly. Patient denies dizziness, chest pain, dyspnea or palpitations.  She has history of CVA and seizure disorder, followed by neurology.  She takes aspirin and statin for history of CVA.  She takes Depakote for seizures.  Denies any recent seizure-like activity.  Her migraine frequency has improved lately.  She has history of cervical radiculopathy.  She is taking gabapentin 100 mg twice daily currently.  Denies any recent worsening of neck pain.   She takes Prozac 20 mg QD for h/o depression.  She denies any anhedonia, SI or HI.     Past Medical History:  Diagnosis Date   Acute respiratory failure with hypoxia (HCC) 10/18/2014   Asthma    Cholecystitis with cholelithiasis 07/12/2014   Cholelithiasis with acute cholecystitis 07/13/2014   DDD (degenerative disc disease), lumbar    Depression    HTN (hypertension)    Hx of scabies    Hyperlipidemia    Rectal bleed 11/13/2011   Seizures (HCC)    remote   Stroke [facility]) 1990   chronic right hemiparesis    Past Surgical History:  Procedure Laterality Date   CESAREAN SECTION     CHOLECYSTECTOMY N/A 07/13/2014   Procedure: LAPAROSCOPIC CONVERTED TO OPEN  CHOLECYSTECTOMY ;  Surgeon:  Darnell Level, MD;  Location: [facility] ORS;  Service: General;  Laterality: N/A;   COLONOSCOPY  12/24/2011   Procedure: COLONOSCOPY;  Surgeon: West Bali, MD;  Location: [facility] ENDO SUITE;  Service: Endoscopy;  Laterality: N/A;  2:00   GALLBLADDER SURGERY     [name] /[name]   PARTIAL HYSTERECTOMY     heavy bleeding    Family History  Problem Relation Age of Onset   Stroke Mother    Heart attack Mother    Stroke Father    Diabetes Other    Colon cancer Neg Hx    Liver disease Neg Hx     Social History   Socioeconomic History   Marital status: Divorced    Spouse name: Not on file   Number of children: 2   Years of education: Not on file   Highest education level: Not on file  Occupational History   Occupation: disabled    Employer: UNEMPLOYED  Tobacco Use   Smoking status: Never   Smokeless tobacco: Never  Vaping Use   Vaping status: Never Used  Substance and Sexual Activity   Alcohol use: Yes    Alcohol/week: 2.0 standard drinks of alcohol    Types: 2 Glasses of wine per week    Comment: couple of drinks on weekends   Drug use: No   Sexual activity: Not Currently  Other Topics Concern  Not on file  Social History Narrative   On disability. Used to work at [employer]/Worked at [employer] Retired now   [employer].    Two children.    Daughter: 93    Son: 27   Has 10 grand children.    Enjoys reading      Diet: eats lots of fruits and veggies, occasional meat: chicken mostly, likes pasta    Caffeine: Tea-green lipton   Water: about 5 cups daily      Wears seatbelt   Smoke detectors      Social Drivers of Health   Financial Resource Strain: Low Risk  (03/12/2023)   Overall Financial Resource Strain (CARDIA)    Difficulty of Paying Living Expenses: Not hard at all  Food Insecurity: No Food Insecurity (03/12/2023)   Hunger Vital Sign    Worried About Running Out of Food in the Last Year: Never true    Ran Out of Food in the Last Year: Never true   Transportation Needs: No Transportation Needs (03/12/2023)   PRAPARE - Administrator, Civil Service (Medical): No    Lack of Transportation (Non-Medical): No  Physical Activity: Patient Declined (03/12/2023)   Exercise Vital Sign    Days of Exercise per Week: Patient declined    Minutes of Exercise per Session: Patient declined  Stress: No Stress Concern Present (03/12/2023)   [employer] of Occupational Health - Occupational Stress Questionnaire    Feeling of Stress : Not at all  Social Connections: Moderately Isolated (03/12/2023)   Social Connection and Isolation Panel [NHANES]    Frequency of Communication with Friends and Family: More than three times a week    Frequency of Social Gatherings with Friends and Family: More than three times a week    Attends Religious Services: More than 4 times per year    Active Member of [employer] or Organizations: No    Attends Banker Meetings: Never    Marital Status: Widowed  Intimate Partner Violence: Not At Risk (03/12/2023)   Humiliation, Afraid, Rape, and Kick questionnaire    Fear of Current or Ex-Partner: No    Emotionally Abused: No    Physically Abused: No    Sexually Abused: No    Outpatient Medications Prior to Visit  Medication Sig Dispense Refill   albuterol (ACCUNEB) 0.63 MG/3ML nebulizer solution Take 3 mLs (0.63 mg total) by nebulization every 6 (six) hours as needed for wheezing or shortness of breath. 3 mL 3   aspirin EC 81 MG tablet Take 1 tablet (81 mg total) by mouth daily.     budesonide-formoterol (SYMBICORT) 80-4.5 MCG/ACT inhaler INHALE 2 PUFFS BY MOUTH EVERY 12 HOURS 10.2 g 2   cyclobenzaprine (FLEXERIL) 5 MG tablet Take 1 tablet (5 mg total) by mouth 2 (two) times daily as needed for muscle spasms. 30 tablet 1   divalproex (DEPAKOTE) 250 MG DR tablet TAKE 1 TABLET(250 MG) BY MOUTH THREE TIMES DAILY 90 tablet 0   gabapentin (NEURONTIN) 100 MG capsule TAKE 1 CAPSULE(100 MG) BY MOUTH TWICE  DAILY 60 capsule 3   levalbuterol (XOPENEX HFA) 45 MCG/ACT inhaler INHALE 1 TO 2 PUFFS BY MOUTH EVERY 4 TO 6 HOURS AS NEEDED FOR WHEEZING 15 g 5   Nebulizers (MY MDI PORTABLE NEBULISER) MISC 1 application by Does not apply route as needed. 1 each 0   ondansetron (ZOFRAN) 4 MG tablet Take 1 tablet (4 mg total) by mouth every 8 (eight) hours as needed for  nausea or vomiting. 20 tablet 0   pravastatin (PRAVACHOL) 40 MG tablet Take 1 tablet (40 mg total) by mouth daily. 90 tablet 3   Rimegepant Sulfate (NURTEC) 75 MG TBDP Take 1 tablet (75 mg total) by mouth every other day. 16 tablet 0   UNABLE TO FIND 1 application by Other route daily. Wrist splint for carpal tunnel syndrome: Apply it daily. 1 each 0   verapamil (CALAN) 120 MG tablet TAKE 1 TABLET(120 MG) BY MOUTH DAILY 90 tablet 1   FLUoxetine (PROZAC) 20 MG capsule TAKE 1 CAPSULE(20 MG) BY MOUTH DAILY 30 capsule 0   No facility-administered medications prior to visit.    Allergies  Allergen Reactions   Bee Venom Swelling    ROS Review of Systems  Constitutional:  Positive for fatigue. Negative for chills and fever.  HENT:  Negative for congestion, sinus pressure, sinus pain and sore throat.   Eyes:  Negative for pain and discharge.  Respiratory:  Negative for cough and shortness of breath.   Cardiovascular:  Negative for chest pain and palpitations.  Gastrointestinal:  Negative for abdominal pain, diarrhea, nausea and vomiting.  Endocrine: Negative for polydipsia and polyuria.  Genitourinary:  Negative for dysuria and hematuria.  Musculoskeletal:  Positive for neck pain. Negative for neck stiffness.  Skin:  Negative for rash.  Neurological:  Positive for headaches. Negative for dizziness and weakness.  Psychiatric/Behavioral:  Positive for sleep disturbance. Negative for agitation and behavioral problems.       Objective:    Physical Exam Vitals reviewed.  Constitutional:      General: She is not in acute distress.     Appearance: She is obese. She is not diaphoretic.  HENT:     Head: Normocephalic and atraumatic.     Nose: Nose normal. No congestion.     Mouth/Throat:     Mouth: Mucous membranes are moist.     Pharynx: No posterior oropharyngeal erythema.  Eyes:     General: No scleral icterus.    Extraocular Movements: Extraocular movements intact.  Cardiovascular:     Rate and Rhythm: Normal rate and regular rhythm.     Heart sounds: Normal heart sounds. No murmur heard. Pulmonary:     Breath sounds: No wheezing or rales.  Abdominal:     Palpations: Abdomen is soft.     Tenderness: There is no abdominal tenderness.  Musculoskeletal:     Cervical back: Neck supple. Tenderness present. Pain with movement present.     Right lower leg: No edema.     Left lower leg: No edema.  Skin:    General: Skin is warm.     Findings: No rash.  Neurological:     General: No focal deficit present.     Mental Status: She is alert and oriented to person, place, and time.     Motor: Weakness (RUE - 4/5, RLE - 4/5) present.  Psychiatric:        Mood and Affect: Mood normal.        Behavior: Behavior normal.     BP 108/75   Pulse 71   Ht 5\' 2"  (1.575 m)   Wt 189 lb 3.2 oz (85.8 kg)   SpO2 96%   BMI 34.61 kg/m  Wt Readings from Last 3 Encounters:  05/02/23 189 lb 3.2 oz (85.8 kg)  04/03/23 186 lb (84.4 kg)  03/12/23 186 lb (84.4 kg)    Lab Results  Component Value Date   TSH 1.350 10/29/2022  Lab Results  Component Value Date   WBC 6.6 10/29/2022   HGB 14.0 10/29/2022   HCT 40.9 10/29/2022   MCV 92 10/29/2022   PLT 293 10/29/2022   Lab Results  Component Value Date   NA 135 02/26/2023   K 4.0 02/26/2023   CO2 20 (L) 02/26/2023   GLUCOSE 91 02/26/2023   BUN 29 (H) 02/26/2023   CREATININE 0.56 02/26/2023   BILITOT <0.2 10/29/2022   ALKPHOS 81 10/29/2022   AST 17 10/29/2022   ALT 16 10/29/2022   PROT 7.1 10/29/2022   ALBUMIN 4.1 10/29/2022   CALCIUM 9.2 02/26/2023   ANIONGAP 11  02/26/2023   EGFR 98 10/29/2022   Lab Results  Component Value Date   CHOL 176 10/29/2022   Lab Results  Component Value Date   HDL 51 10/29/2022   Lab Results  Component Value Date   LDLCALC 105 (H) 10/29/2022   Lab Results  Component Value Date   TRIG 108 10/29/2022   Lab Results  Component Value Date   CHOLHDL 3.5 10/29/2022   Lab Results  Component Value Date   HGBA1C 5.7 (H) 10/29/2022      Assessment & Plan:   Problem List Items Addressed This Visit       Cardiovascular and Mediastinum   Essential hypertension - Primary   BP Readings from Last 1 Encounters:  05/02/23 108/75   Well-controlled with Verapamil Counseled for compliance with the medications Advised DASH diet and moderate exercise/walking, at least 150 mins/week      Intractable chronic migraine without aura and without status migrainosus   She has tried Aleve with mild relief, but advised to avoid daily intake Trial of Tylenol and Excedrin Migraine alternatively She is on Depakote and Verapamil currently, which have ppx role in migraine as well Has Nurtec, sample was provided in the past - unable to take triptans due to h/o CVA      Relevant Medications   FLUoxetine (PROZAC) 20 MG capsule     Respiratory   Moderate persistent asthma   Continue Symbicort and as needed Xopenex She has better response with albuterol nebulizer, prescription sent        Nervous and Auditory   CVA, old, hemiparesis (HCC)   With right hemiparesis On aspirin and statin      Cervical radiculopathy   Neck pain with radicular symptoms, likely due to cervical radiculopathy Flexeril as needed for muscle spasms Continue gabapentin 100 mg twice daily      Relevant Medications   FLUoxetine (PROZAC) 20 MG capsule     Other   Depression, recurrent (HCC)   Well controlled currently Has tried Lexapro in the past Had titrated Lexapro and switched to Prozac Was uncontrolled due to stress at home, increased  dose of Prozac to 20 mg QD recently - now better      Relevant Medications   FLUoxetine (PROZAC) 20 MG capsule   Encounter for examination following treatment at hospital   ER chart reviewed Was given oral penicillin for dental abscess She still has dental caries/abscess - would avoid oral antibiotic for now as she has dentist appointment today       Meds ordered this encounter  Medications   FLUoxetine (PROZAC) 20 MG capsule    Sig: Take 1 capsule (20 mg total) by mouth daily.    Dispense:  30 capsule    Refill:  5    Follow-up: Return in about 6 months (around 10/30/2023) for HTN.  Anabel Halon, MD

## 2023-05-02 NOTE — Patient Instructions (Signed)
 Please continue to take medications as prescribed.  Please continue to follow low carb diet and perform moderate exercise/walking as tolerated.

## 2023-05-02 NOTE — Assessment & Plan Note (Signed)
BP Readings from Last 1 Encounters:  05/02/23 108/75   Well-controlled with Verapamil Counseled for compliance with the medications Advised DASH diet and moderate exercise/walking, at least 150 mins/week

## 2023-05-02 NOTE — Assessment & Plan Note (Signed)
With right hemiparesis On aspirin and statin 

## 2023-05-02 NOTE — Assessment & Plan Note (Signed)
Continue Symbicort and as needed Xopenex She has better response with albuterol nebulizer, prescription sent

## 2023-05-02 NOTE — Assessment & Plan Note (Signed)
She has tried Aleve with mild relief, but advised to avoid daily intake Trial of Tylenol and Excedrin Migraine alternatively She is on Depakote and Verapamil currently, which have ppx role in migraine as well Has Nurtec, sample was provided in the past - unable to take triptans due to h/o CVA

## 2023-05-02 NOTE — Assessment & Plan Note (Signed)
ER chart reviewed Was given oral penicillin for dental abscess She still has dental caries/abscess - would avoid oral antibiotic for now as she has dentist appointment today

## 2023-05-02 NOTE — Assessment & Plan Note (Signed)
Neck pain with radicular symptoms, likely due to cervical radiculopathy Flexeril as needed for muscle spasms Continue gabapentin 100 mg twice daily

## 2023-05-02 NOTE — Assessment & Plan Note (Addendum)
Well controlled currently Has tried Lexapro in the past Had titrated Lexapro and switched to Prozac Was uncontrolled due to stress at home, increased dose of Prozac to 20 mg QD recently - now better

## 2023-05-05 ENCOUNTER — Ambulatory Visit (HOSPITAL_COMMUNITY): Payer: 59

## 2023-05-08 ENCOUNTER — Telehealth: Payer: Self-pay

## 2023-05-08 NOTE — Telephone Encounter (Signed)
 Copied from CRM [id number]. Topic: Referral - Question >> May 06, 2023 12:58 PM Ivette P wrote: Reason for CRM: Heather  from [facility] in to say they received orders for pt but with no notes. Needs notes faxed over to be able to process referral

## 2023-05-09 NOTE — Telephone Encounter (Signed)
 Office visit notes printed and faxed to [phone]

## 2023-05-12 ENCOUNTER — Ambulatory Visit (HOSPITAL_COMMUNITY)
Admission: RE | Admit: 2023-05-12 | Discharge: 2023-05-12 | Disposition: A | Discharge status: Home / Self Care | Payer: 59 | Source: Ambulatory Visit | Attending: Internal Medicine | Admitting: Internal Medicine

## 2023-05-12 DIAGNOSIS — Z1231 Encounter for screening mammogram for malignant neoplasm of breast: Secondary | ICD-10-CM | POA: Insufficient documentation

## 2023-05-13 ENCOUNTER — Other Ambulatory Visit: Payer: Self-pay | Admitting: Neurology

## 2023-05-13 DIAGNOSIS — R569 Unspecified convulsions: Secondary | ICD-10-CM

## 2023-05-13 DIAGNOSIS — G8191 Hemiplegia, unspecified affecting right dominant side: Secondary | ICD-10-CM

## 2023-05-13 DIAGNOSIS — I6389 Other cerebral infarction: Secondary | ICD-10-CM

## 2023-05-13 NOTE — Telephone Encounter (Signed)
Called patient and LVM to give [place] a call back. Patient is needing to request her refills from her PCP.

## 2023-05-29 ENCOUNTER — Other Ambulatory Visit: Payer: Self-pay | Admitting: Internal Medicine

## 2023-07-05 ENCOUNTER — Other Ambulatory Visit: Payer: Self-pay | Admitting: Internal Medicine

## 2023-07-05 DIAGNOSIS — I10 Essential (primary) hypertension: Secondary | ICD-10-CM

## 2023-08-28 ENCOUNTER — Ambulatory Visit: Payer: Self-pay | Admitting: Family Medicine

## 2023-09-04 ENCOUNTER — Other Ambulatory Visit: Payer: Self-pay | Admitting: Internal Medicine

## 2023-09-04 DIAGNOSIS — R42 Dizziness and giddiness: Secondary | ICD-10-CM

## 2023-09-10 ENCOUNTER — Other Ambulatory Visit: Payer: Self-pay | Admitting: Internal Medicine

## 2023-09-10 DIAGNOSIS — J4540 Moderate persistent asthma, uncomplicated: Secondary | ICD-10-CM

## 2023-09-17 ENCOUNTER — Ambulatory Visit: Payer: Self-pay

## 2023-09-17 NOTE — Telephone Encounter (Signed)
 FYI Only or Action Required?: FYI only for provider  Patient was last seen in primary care on 05/02/2023 by Meldon Sport, MD. Called Nurse Triage reporting Urinary Frequency. Symptoms began about a month ago. Interventions attempted: Rest, hydration, or home remedies. Symptoms are: gradually worsening.  Triage Disposition: See Physician Within 24 Hours  Patient/caregiver understands and will follow disposition?: Yes   Copied from CRM [id number]. Topic: Clinical - Red Word Triage >> Sep 17, 2023  3:42 PM Alysia Jumbo S wrote: [facility] that prompted transfer to Nurse Triage: vaginal discharge, foul odor, frequent urination, pain Reason for Disposition  Unusual vaginal discharge (e.g., bad smelling, yellow, green, or foamy-white)  Answer Assessment - Initial Assessment Questions 1. SEVERITY: How bad is the pain?  (e.g., Scale 1-10; mild, moderate, or severe)   - MILD (1-3): complains slightly about urination hurting   - MODERATE (4-7): interferes with normal activities     - SEVERE (8-10): excruciating, unwilling or unable to urinate because of the pain      Mild 2. FREQUENCY: How many times have you had painful urination today?      Increased 3. PATTERN: Is pain present every time you urinate or just sometimes?      yes 4. ONSET: When did the painful urination start?      One month ago 5. FEVER: Do you have a fever? If Yes, ask: What is your temperature, how was it measured, and when did it start?     denies 6. PAST UTI: Have you had a urine infection before? If Yes, ask: When was the last time? and What happened that time?      Denies 7. CAUSE: What do you think is causing the painful urination?  (e.g., UTI, scratch, Herpes sore)     unsure 8. OTHER SYMPTOMS: Do you have any other symptoms? (e.g., blood in urine, flank pain, genital sores, urgency, vaginal discharge)     Mucous discharge  Additional info: increase her water  and cranberry juice but still  persisting.  Protocols used: Urination Pain - Female-A-AH

## 2023-09-24 ENCOUNTER — Ambulatory Visit (INDEPENDENT_AMBULATORY_CARE_PROVIDER_SITE_OTHER): Admitting: Internal Medicine

## 2023-09-24 ENCOUNTER — Encounter: Payer: Self-pay | Admitting: Internal Medicine

## 2023-09-24 VITALS — BP 124/80 | HR 75 | Ht 62.0 in | Wt 198.2 lb

## 2023-09-24 DIAGNOSIS — E559 Vitamin D deficiency, unspecified: Secondary | ICD-10-CM

## 2023-09-24 DIAGNOSIS — N760 Acute vaginitis: Secondary | ICD-10-CM | POA: Insufficient documentation

## 2023-09-24 DIAGNOSIS — E782 Mixed hyperlipidemia: Secondary | ICD-10-CM

## 2023-09-24 DIAGNOSIS — F339 Major depressive disorder, recurrent, unspecified: Secondary | ICD-10-CM

## 2023-09-24 DIAGNOSIS — R7303 Prediabetes: Secondary | ICD-10-CM | POA: Diagnosis not present

## 2023-09-24 DIAGNOSIS — I10 Essential (primary) hypertension: Secondary | ICD-10-CM

## 2023-09-24 DIAGNOSIS — G8191 Hemiplegia, unspecified affecting right dominant side: Secondary | ICD-10-CM

## 2023-09-24 DIAGNOSIS — G40909 Epilepsy, unspecified, not intractable, without status epilepticus: Secondary | ICD-10-CM

## 2023-09-24 MED ORDER — DIVALPROEX SODIUM 250 MG PO DR TAB: 250.0000 mg | DELAYED_RELEASE_TABLET | Freq: Three times a day (TID) | ORAL | 3 refills | Status: AC

## 2023-09-24 NOTE — Progress Notes (Signed)
 Established Patient Office Visit  Subjective:  Patient ID: Patty [name], female    DOB: [birth year]  Age: 58 y.o. MRN: [id number]  CC:  Chief Complaint  Patient presents with   Vaginal Discharge    Mucus and foul odor     HPI Patty [name] is a 58 y.o. female with past medical history of HTN, CVA, OSA, asthma, seizure disorder, HLD, depression and morbid obesity who presents for f/u of her chronic medical conditions and vaginal discharge.  She reports yellowish vaginal discharge, which is foul-smelling for the last 2 days.  Denies any vaginal bleeding currently.  Denies dysuria, hematuria currently.  Denies abdominal pain, nausea or vomiting.  She admits that she has been eating a lot of sweets lately, has a history of prediabetes.  HTN: BP is well-controlled. Takes verapamil  120 mg QD regularly. Patient denies dizziness, chest pain, dyspnea or palpitations.  She has history of CVA and seizure disorder, used to be followed by neurology.  She takes aspirin  and statin for history of CVA.  She takes Depakote  for seizures.  Denies any recent seizure-like activity.  Her migraine frequency has improved lately.  She has history of cervical radiculopathy.  She is taking gabapentin  100 mg twice daily currently.  Denies any recent worsening of neck pain.   She takes Prozac  20 mg QD for h/o depression.  She denies any anhedonia, SI or HI.     Past Medical History:  Diagnosis Date   Acute respiratory failure with hypoxia (HCC) 10/18/2014   Asthma    Cholecystitis with cholelithiasis 07/12/2014   Cholelithiasis with acute cholecystitis 07/13/2014   DDD (degenerative disc disease), lumbar    Depression    HTN (hypertension)    Hx of scabies    Hyperlipidemia    Rectal bleed 11/13/2011   Seizures (HCC)    remote   Stroke [facility]) 1990   chronic right hemiparesis    Past Surgical History:  Procedure Laterality Date   CESAREAN SECTION     CHOLECYSTECTOMY N/A 07/13/2014   Procedure:  LAPAROSCOPIC CONVERTED TO OPEN  CHOLECYSTECTOMY ;  Surgeon: Krystal Spinner, MD;  Location: [facility];  Service: General;  Laterality: N/A;   COLONOSCOPY  12/24/2011   Procedure: COLONOSCOPY;  Surgeon: Margo LITTIE Haddock, MD;  Location: [facility];  Service: Endoscopy;  Laterality: N/A;  2:00   GALLBLADDER SURGERY     [place]   PARTIAL HYSTERECTOMY     heavy bleeding    Family History  Problem Relation Age of Onset   Stroke Mother    Heart attack Mother    Stroke Father    Diabetes Other    Colon cancer Neg Hx    Liver disease Neg Hx     Social History   Socioeconomic History   Marital status: Divorced    Spouse name: Not on file   Number of children: 2   Years of education: Not on file   Highest education level: Not on file  Occupational History   Occupation: disabled    Employer: UNEMPLOYED  Tobacco Use   Smoking status: Never   Smokeless tobacco: Never  Vaping Use   Vaping status: Never Used  Substance and Sexual Activity   Alcohol use: Yes    Alcohol/week: 2.0 standard drinks of alcohol    Types: 2 Glasses of wine per week    Comment: couple of drinks on weekends   Drug use: No   Sexual activity: Not Currently  Other  Topics Concern   Not on file  Social History Narrative   On disability. Used to work at [employer]/Worked at [employer] Retired now   [employer].    Two children.    Daughter: 40    Son: 99   Has 10 grand children.    Enjoys reading      Diet: eats lots of fruits and veggies, occasional meat: chicken mostly, likes pasta    Caffeine: Tea-green lipton   Water : about 5 cups daily      Wears seatbelt   Smoke detectors      Social Drivers of Health   Financial Resource Strain: Low Risk  (03/12/2023)   Overall Financial Resource Strain (CARDIA)    Difficulty of Paying Living Expenses: Not hard at all  Food Insecurity: No Food Insecurity (03/12/2023)   Hunger Vital Sign    Worried About Running Out of Food in the Last Year:  Never true    Ran Out of Food in the Last Year: Never true  Transportation Needs: No Transportation Needs (03/12/2023)   PRAPARE - Administrator, Civil Service (Medical): No    Lack of Transportation (Non-Medical): No  Physical Activity: Patient Declined (03/12/2023)   Exercise Vital Sign    Days of Exercise per Week: Patient declined    Minutes of Exercise per Session: Patient declined  Stress: No Stress Concern Present (03/12/2023)   [employer] of Occupational Health - Occupational Stress Questionnaire    Feeling of Stress : Not at all  Social Connections: Moderately Isolated (03/12/2023)   Social Connection and Isolation Panel    Frequency of Communication with Friends and Family: More than three times a week    Frequency of Social Gatherings with Friends and Family: More than three times a week    Attends Religious Services: More than 4 times per year    Active Member of [employer] or Organizations: No    Attends Banker Meetings: Never    Marital Status: Widowed  Intimate Partner Violence: Not At Risk (03/12/2023)   Humiliation, Afraid, Rape, and Kick questionnaire    Fear of Current or Ex-Partner: No    Emotionally Abused: No    Physically Abused: No    Sexually Abused: No    Outpatient Medications Prior to Visit  Medication Sig Dispense Refill   albuterol  (ACCUNEB ) 0.63 MG/3ML nebulizer solution Take 3 mLs (0.63 mg total) by nebulization every 6 (six) hours as needed for wheezing or shortness of breath. 3 mL 3   aspirin  EC 81 MG tablet Take 1 tablet (81 mg total) by mouth daily.     budesonide -formoterol  (SYMBICORT ) 80-4.5 MCG/ACT inhaler INHALE 2 PUFFS BY MOUTH EVERY 12 HOURS 10.2 g 2   cyclobenzaprine  (FLEXERIL ) 5 MG tablet Take 1 tablet (5 mg total) by mouth 2 (two) times daily as needed for muscle spasms. 30 tablet 1   FLUoxetine  (PROZAC ) 20 MG capsule Take 1 capsule (20 mg total) by mouth daily. 30 capsule 5   gabapentin  (NEURONTIN ) 100 MG  capsule TAKE 1 CAPSULE(100 MG) BY MOUTH TWICE DAILY 60 capsule 3   levalbuterol  (XOPENEX  HFA) 45 MCG/ACT inhaler INHALE 1 TO 2 PUFFS BY MOUTH EVERY 4 TO 6 HOURS AS NEEDED FOR WHEEZING 15 g 5   Nebulizers (MY MDI PORTABLE NEBULISER) MISC 1 application by Does not apply route as needed. 1 each 0   ondansetron  (ZOFRAN ) 4 MG tablet Take 1 tablet (4 mg total) by mouth every 8 (eight) hours as  needed for nausea or vomiting. 20 tablet 0   pravastatin  (PRAVACHOL ) 40 MG tablet Take 1 tablet (40 mg total) by mouth daily. 90 tablet 3   Rimegepant Sulfate (NURTEC) 75 MG TBDP Take 1 tablet (75 mg total) by mouth every other day. 16 tablet 0   UNABLE TO FIND 1 application by Other route daily. Wrist splint for carpal tunnel syndrome: Apply it daily. 1 each 0   verapamil  (CALAN ) 120 MG tablet TAKE 1 TABLET(120 MG) BY MOUTH DAILY 90 tablet 1   divalproex  (DEPAKOTE ) 250 MG DR tablet TAKE 1 TABLET(250 MG) BY MOUTH THREE TIMES DAILY 90 tablet 3   No facility-administered medications prior to visit.    Allergies  Allergen Reactions   Bee Venom Swelling    ROS Review of Systems  Constitutional:  Positive for fatigue. Negative for chills and fever.  HENT:  Negative for congestion, sinus pressure, sinus pain and sore throat.   Eyes:  Negative for pain and discharge.  Respiratory:  Negative for cough and shortness of breath.   Cardiovascular:  Negative for chest pain and palpitations.  Gastrointestinal:  Negative for abdominal pain, diarrhea, nausea and vomiting.  Endocrine: Negative for polydipsia and polyuria.  Genitourinary:  Positive for vaginal discharge. Negative for dysuria and hematuria.  Musculoskeletal:  Positive for neck pain. Negative for neck stiffness.  Skin:  Negative for rash.  Neurological:  Positive for headaches. Negative for dizziness and weakness.  Psychiatric/Behavioral:  Positive for sleep disturbance. Negative for agitation and behavioral problems.       Objective:    Physical  Exam Vitals reviewed.  Constitutional:      General: She is not in acute distress.    Appearance: She is obese. She is not diaphoretic.  HENT:     Head: Normocephalic and atraumatic.     Nose: Nose normal. No congestion.     Mouth/Throat:     Mouth: Mucous membranes are moist.     Pharynx: No posterior oropharyngeal erythema.   Eyes:     General: No scleral icterus.    Extraocular Movements: Extraocular movements intact.    Cardiovascular:     Rate and Rhythm: Normal rate and regular rhythm.     Heart sounds: Normal heart sounds. No murmur heard. Pulmonary:     Breath sounds: No wheezing or rales.  Abdominal:     Palpations: Abdomen is soft.     Tenderness: There is no abdominal tenderness.   Musculoskeletal:     Cervical back: Neck supple. Tenderness present. Pain with movement present.     Right lower leg: No edema.     Left lower leg: No edema.   Skin:    General: Skin is warm.     Findings: No rash.   Neurological:     General: No focal deficit present.     Mental Status: She is alert and oriented to person, place, and time.     Motor: Weakness (RUE - 4/5, RLE - 4/5) present.   Psychiatric:        Mood and Affect: Mood normal.        Behavior: Behavior normal.     BP 124/80   Pulse 75   Ht 5' 2 (1.575 m)   Wt 198 lb 3.2 oz (89.9 kg)   SpO2 95%   BMI 36.25 kg/m  Wt Readings from Last 3 Encounters:  09/24/23 198 lb 3.2 oz (89.9 kg)  05/02/23 189 lb 3.2 oz (85.8 kg)  04/03/23 186 lb (  84.4 kg)    Lab Results  Component Value Date   TSH 1.350 10/29/2022   Lab Results  Component Value Date   WBC 6.6 10/29/2022   HGB 14.0 10/29/2022   HCT 40.9 10/29/2022   MCV 92 10/29/2022   PLT 293 10/29/2022   Lab Results  Component Value Date   NA 135 02/26/2023   K 4.0 02/26/2023   CO2 20 (L) 02/26/2023   GLUCOSE 91 02/26/2023   BUN 29 (H) 02/26/2023   CREATININE 0.56 02/26/2023   BILITOT <0.2 10/29/2022   ALKPHOS 81 10/29/2022   AST 17 10/29/2022    ALT 16 10/29/2022   PROT 7.1 10/29/2022   ALBUMIN 4.1 10/29/2022   CALCIUM 9.2 02/26/2023   ANIONGAP 11 02/26/2023   EGFR 98 10/29/2022   Lab Results  Component Value Date   CHOL 176 10/29/2022   Lab Results  Component Value Date   HDL 51 10/29/2022   Lab Results  Component Value Date   LDLCALC 105 (H) 10/29/2022   Lab Results  Component Value Date   TRIG 108 10/29/2022   Lab Results  Component Value Date   CHOLHDL 3.5 10/29/2022   Lab Results  Component Value Date   HGBA1C 5.7 (H) 10/29/2022      Assessment & Plan:   Problem List Items Addressed This Visit       Cardiovascular and Mediastinum   Essential hypertension   BP Readings from Last 1 Encounters:  09/24/23 124/80   Well-controlled with Verapamil  120 mg QD Counseled for compliance with the medications Advised DASH diet and moderate exercise/walking, at least 150 mins/week      Relevant Orders   TSH     Nervous and Auditory   Seizure disorder (HCC)   Takes Depakote  250 mg TID Used to be followed by neurology- refilled Depakote  Check Depakote  level      Relevant Medications   divalproex  (DEPAKOTE ) 250 MG DR tablet   Other Relevant Orders   TSH   CMP14+EGFR   CBC with Differential/Platelet   Valproic Acid  level   Right hemiparesis (HCC)   Due to old CVA On aspirin  and statin        Genitourinary   Acute vaginitis - Primary   Check vaginal swab Concern for BV - will treat accordingly      Relevant Orders   NuSwab Vaginitis Plus (VG+)     Other   Mixed hyperlipidemia   Relevant Orders   Lipid panel   Depression, recurrent (HCC)   Well controlled currently Has tried Lexapro  in the past Had titrated Lexapro  and switched to Prozac  Was uncontrolled due to stress at home, on Prozac  to 20 mg QD - now better      Relevant Orders   TSH   Prediabetes   Lab Results  Component Value Date   HGBA1C 5.7 (H) 10/29/2022   Advised to follow low-carb diet for now She needs to cut  down sweet intake - vaginitis can a sign of worsening glycemic profile      Relevant Orders   Hemoglobin A1c   CMP14+EGFR   Other Visit Diagnoses       Vitamin D  deficiency       Relevant Orders   VITAMIN D  25 Hydroxy (Vit-D Deficiency, Fractures)        Meds ordered this encounter  Medications   divalproex  (DEPAKOTE ) 250 MG DR tablet    Sig: Take 1 tablet (250 mg total) by mouth 3 (three) times daily.  Dispense:  270 tablet    Refill:  3    Follow-up: Return in about 6 weeks (around 11/05/2023).    Suzzane MARLA Blanch, MD

## 2023-09-24 NOTE — Assessment & Plan Note (Signed)
Due to old CVA On aspirin and statin

## 2023-09-24 NOTE — Assessment & Plan Note (Addendum)
 Takes Depakote  250 mg TID Used to be followed by neurology- refilled Depakote  Check Depakote  level

## 2023-09-24 NOTE — Assessment & Plan Note (Addendum)
 Lab Results  Component Value Date   HGBA1C 5.7 (H) 10/29/2022   Advised to follow low-carb diet for now She needs to cut down sweet intake - vaginitis can a sign of worsening glycemic profile

## 2023-09-24 NOTE — Assessment & Plan Note (Signed)
 Well controlled currently Has tried Lexapro  in the past Had titrated Lexapro  and switched to Prozac  Was uncontrolled due to stress at home, on Prozac  to 20 mg QD - now better

## 2023-09-24 NOTE — Assessment & Plan Note (Signed)
 Check vaginal swab Concern for BV - will treat accordingly

## 2023-09-24 NOTE — Assessment & Plan Note (Signed)
 BP Readings from Last 1 Encounters:  09/24/23 124/80   Well-controlled with Verapamil  120 mg QD Counseled for compliance with the medications Advised DASH diet and moderate exercise/walking, at least 150 mins/week

## 2023-09-24 NOTE — Patient Instructions (Signed)
Please continue to take medications as prescribed.  Please continue to follow low carb diet and perform moderate exercise/walking at least 150 mins/week.  Please get fasting blood tests done before the next visit. 

## 2023-09-26 ENCOUNTER — Ambulatory Visit: Payer: Self-pay | Admitting: Internal Medicine

## 2023-09-26 MED ORDER — METRONIDAZOLE 500 MG PO TABS: 500.0000 mg | ORAL_TABLET | Freq: Two times a day (BID) | ORAL | 0 refills | Status: AC

## 2023-09-26 NOTE — Addendum Note (Signed)
 Addended byBETHA TOBIE DOWNS on: 09/26/2023 07:50 AM   Modules accepted: Orders

## 2023-10-01 LAB — NUSWAB VAGINITIS PLUS (VG+)
Candida albicans, NAA: NEGATIVE
Candida glabrata, NAA: NEGATIVE
Megasphaera 1: HIGH {score} — AB

## 2023-10-07 ENCOUNTER — Other Ambulatory Visit: Payer: Self-pay | Admitting: Internal Medicine

## 2023-10-07 DIAGNOSIS — R42 Dizziness and giddiness: Secondary | ICD-10-CM

## 2023-10-10 ENCOUNTER — Other Ambulatory Visit: Payer: Self-pay | Admitting: Internal Medicine

## 2023-10-10 DIAGNOSIS — R42 Dizziness and giddiness: Secondary | ICD-10-CM

## 2023-10-13 ENCOUNTER — Other Ambulatory Visit: Payer: Self-pay | Admitting: Internal Medicine

## 2023-10-13 ENCOUNTER — Ambulatory Visit: Payer: Self-pay

## 2023-10-13 DIAGNOSIS — R42 Dizziness and giddiness: Secondary | ICD-10-CM

## 2023-10-13 MED ORDER — MECLIZINE HCL 25 MG PO TABS: 25.0000 mg | ORAL_TABLET | Freq: Three times a day (TID) | ORAL | 0 refills | Status: AC | PRN

## 2023-10-13 NOTE — Telephone Encounter (Signed)
 Left detailed vm

## 2023-10-13 NOTE — Telephone Encounter (Signed)
 FYI Only or Action Required?: Action required by provider: medication refill request.  Patient was last seen in primary care on 09/24/2023 by Tobie Suzzane POUR, MD.  Called Nurse Triage reporting Dizziness.  Symptoms began about a month ago.  Interventions attempted: Nothing.  Symptoms are: unchanged.  Triage Disposition: See PCP When Office is Open (Within 3 Days)  Patient/caregiver understands and will follow disposition?: No, wishes to speak with PCP  Pt denies scheduling an appt. She is requesting refill on Meclizine  d/t having dizziness and nausea since 09/11/23. Also asking if PCP can increase depression medication. Suggested pt come in to see PCP to discuss but she declined. Advised I would send message to PCP.   Copied from CRM [id number]. Topic: Clinical - Red Word Triage >> Oct 13, 2023 10:16 AM Harlene ORN wrote: Red Word that prompted transfer to Nurse Triage: she has been having nausea and dizziness for over a month. The medication has not been refilled by her PCP yet and is starting to effect her. Reason for Disposition  [1] MODERATE dizziness (e.g., vertigo; feels very unsteady, interferes with normal activities) AND [2] has been evaluated by doctor (or NP/PA) for this  Answer Assessment - Initial Assessment Questions 2. VERTIGO: Do you feel like either you or the room is spinning or tilting?      yes 3. LIGHTHEADED: Do you feel lightheaded? (e.g., somewhat faint, woozy, weak upon standing)     Weak and woozy  5. ONSET:  When did the dizziness begin?     1 month  6. AGGRAVATING FACTORS: Does anything make it worse? (e.g., standing, change in head position)     Movement or sitting  8. RECURRENT SYMPTOM: Have you had dizziness before? If Yes, ask: When was the last time? What happened that time?     Meclizine  but refusing refill  9. OTHER SYMPTOMS: Do you have any other symptoms? (e.g., earache, headache, numbness, tinnitus, vomiting, weakness)      Nausea  Protocols used: Dizziness - Vertigo-A-AH

## 2023-11-03 ENCOUNTER — Other Ambulatory Visit: Payer: Self-pay | Admitting: Internal Medicine

## 2023-11-03 DIAGNOSIS — E782 Mixed hyperlipidemia: Secondary | ICD-10-CM

## 2023-11-07 ENCOUNTER — Encounter: Payer: Self-pay | Admitting: Internal Medicine

## 2023-11-07 ENCOUNTER — Ambulatory Visit (INDEPENDENT_AMBULATORY_CARE_PROVIDER_SITE_OTHER): Payer: 59 | Admitting: Internal Medicine

## 2023-11-07 VITALS — BP 121/80 | HR 71 | Ht 62.0 in | Wt 194.8 lb

## 2023-11-07 DIAGNOSIS — E559 Vitamin D deficiency, unspecified: Secondary | ICD-10-CM

## 2023-11-07 DIAGNOSIS — M1711 Unilateral primary osteoarthritis, right knee: Secondary | ICD-10-CM | POA: Insufficient documentation

## 2023-11-07 DIAGNOSIS — Z0001 Encounter for general adult medical examination with abnormal findings: Secondary | ICD-10-CM | POA: Diagnosis not present

## 2023-11-07 DIAGNOSIS — G40909 Epilepsy, unspecified, not intractable, without status epilepticus: Secondary | ICD-10-CM

## 2023-11-07 DIAGNOSIS — R7303 Prediabetes: Secondary | ICD-10-CM

## 2023-11-07 DIAGNOSIS — G43719 Chronic migraine without aura, intractable, without status migrainosus: Secondary | ICD-10-CM

## 2023-11-07 DIAGNOSIS — F339 Major depressive disorder, recurrent, unspecified: Secondary | ICD-10-CM

## 2023-11-07 DIAGNOSIS — I10 Essential (primary) hypertension: Secondary | ICD-10-CM

## 2023-11-07 DIAGNOSIS — E782 Mixed hyperlipidemia: Secondary | ICD-10-CM

## 2023-11-07 MED ORDER — NAPROXEN 500 MG PO TABS: 500.0000 mg | ORAL_TABLET | Freq: Two times a day (BID) | ORAL | 1 refills | Status: DC

## 2023-11-07 NOTE — Assessment & Plan Note (Signed)
 Takes Depakote  250 mg TID Used to be followed by neurology- refilled Depakote  Check Depakote  level

## 2023-11-07 NOTE — Assessment & Plan Note (Signed)
 Well controlled currently Has tried Lexapro  in the past Was uncontrolled due to stress at home, on Prozac  to 20 mg QD - now better

## 2023-11-07 NOTE — Assessment & Plan Note (Signed)
 Physical exam as documented. Fasting blood tests ordered today. Advised to get Shingrix and Tdap vaccines at local pharmacy.

## 2023-11-07 NOTE — Patient Instructions (Signed)
Please continue to take medications as prescribed.  Please continue to follow low carb diet and perform moderate exercise/walking at least 150 mins/week.  Please get fasting blood tests done within a week.  

## 2023-11-07 NOTE — Assessment & Plan Note (Signed)
 BP Readings from Last 1 Encounters:  11/07/23 121/80   Well-controlled with Verapamil  120 mg QD Counseled for compliance with the medications Advised DASH diet and moderate exercise/walking, at least 150 mins/week

## 2023-11-07 NOTE — Progress Notes (Signed)
 Established Patient Office Visit  Subjective:  Patient ID: Patty [name], female    DOB: [birth year]  Age: 58 y.o. MRN: [id number]  CC:  Chief Complaint  Patient presents with   Annual Exam    HPI Patty [name] is a 58 y.o. female with past medical history of HTN, CVA, OSA, asthma, seizure disorder, HLD, depression and morbid obesity who presents for annual physical.  HTN: BP is well-controlled. Takes verapamil  120 mg QD regularly. Patient denies dizziness, chest pain, dyspnea or palpitations.  She has history of CVA and seizure disorder, followed by neurology.  She takes aspirin  and statin for history of CVA.  She takes Depakote  for seizures.  Denies any recent seizure-like activity.  Her migraine frequency has improved lately.  She has history of cervical radiculopathy.  She is taking gabapentin  100 mg twice daily currently.  Denies any recent worsening of neck pain.  She reports chronic right knee pain with swelling.  Pain is constant, dull, worse with prolonged standing and walking.  She has tried taking naproxen  in the past with some relief.  She agrees to take Tylenol  arthritis and take naproxen  only for persistent pain.  She takes Prozac  20 mg QD for h/o depression.  She denies any anhedonia, SI or HI.     Past Medical History:  Diagnosis Date   Acute respiratory failure with hypoxia (HCC) 10/18/2014   Asthma    Cholecystitis with cholelithiasis 07/12/2014   Cholelithiasis with acute cholecystitis 07/13/2014   DDD (degenerative disc disease), lumbar    Depression    HTN (hypertension)    Hx of scabies    Hyperlipidemia    Rectal bleed 11/13/2011   Seizures (HCC)    remote   Stroke [facility]) 1990   chronic right hemiparesis    Past Surgical History:  Procedure Laterality Date   CESAREAN SECTION     CHOLECYSTECTOMY N/A 07/13/2014   Procedure: LAPAROSCOPIC CONVERTED TO OPEN  CHOLECYSTECTOMY ;  Surgeon: Krystal Spinner, MD;  Location: [facility];  Service: General;  Laterality:  N/A;   COLONOSCOPY  12/24/2011   Procedure: COLONOSCOPY;  Surgeon: Margo LITTIE Haddock, MD;  Location: [facility];  Service: Endoscopy;  Laterality: N/A;  2:00   GALLBLADDER SURGERY     [place]   PARTIAL HYSTERECTOMY     heavy bleeding    Family History  Problem Relation Age of Onset   Stroke Mother    Heart attack Mother    Stroke Father    Diabetes Other    Colon cancer Neg Hx    Liver disease Neg Hx     Social History   Socioeconomic History   Marital status: Divorced    Spouse name: Not on file   Number of children: 2   Years of education: Not on file   Highest education level: Not on file  Occupational History   Occupation: disabled    Employer: UNEMPLOYED  Tobacco Use   Smoking status: Never   Smokeless tobacco: Never  Vaping Use   Vaping status: Never Used  Substance and Sexual Activity   Alcohol use: Yes    Alcohol/week: 2.0 standard drinks of alcohol    Types: 2 Glasses of wine per week    Comment: couple of drinks on weekends   Drug use: No   Sexual activity: Not Currently  Other Topics Concern   Not on file  Social History Narrative   On disability. Used to work at [employer]/Worked at  [employer] Retired now   [employer].    Two children.    Daughter: 23    Son: 53   Has 10 grand children.    Enjoys reading      Diet: eats lots of fruits and veggies, occasional meat: chicken mostly, likes pasta    Caffeine: Tea-green lipton   Water : about 5 cups daily      Wears seatbelt   Smoke detectors      Social Drivers of Health   Financial Resource Strain: Low Risk  (03/12/2023)   Overall Financial Resource Strain (CARDIA)    Difficulty of Paying Living Expenses: Not hard at all  Food Insecurity: No Food Insecurity (03/12/2023)   Hunger Vital Sign    Worried About Running Out of Food in the Last Year: Never true    Ran Out of Food in the Last Year: Never true  Transportation Needs: No Transportation Needs (03/12/2023)   PRAPARE  - Administrator, Civil Service (Medical): No    Lack of Transportation (Non-Medical): No  Physical Activity: Patient Declined (03/12/2023)   Exercise Vital Sign    Days of Exercise per Week: Patient declined    Minutes of Exercise per Session: Patient declined  Stress: No Stress Concern Present (03/12/2023)   [employer] of Occupational Health - Occupational Stress Questionnaire    Feeling of Stress : Not at all  Social Connections: Moderately Isolated (03/12/2023)   Social Connection and Isolation Panel    Frequency of Communication with Friends and Family: More than three times a week    Frequency of Social Gatherings with Friends and Family: More than three times a week    Attends Religious Services: More than 4 times per year    Active Member of [employer] or Organizations: No    Attends Banker Meetings: Never    Marital Status: Widowed  Intimate Partner Violence: Not At Risk (03/12/2023)   Humiliation, Afraid, Rape, and Kick questionnaire    Fear of Current or Ex-Partner: No    Emotionally Abused: No    Physically Abused: No    Sexually Abused: No    Outpatient Medications Prior to Visit  Medication Sig Dispense Refill   albuterol  (ACCUNEB ) 0.63 MG/3ML nebulizer solution Take 3 mLs (0.63 mg total) by nebulization every 6 (six) hours as needed for wheezing or shortness of breath. 3 mL 3   aspirin  EC 81 MG tablet Take 1 tablet (81 mg total) by mouth daily.     budesonide -formoterol  (SYMBICORT ) 80-4.5 MCG/ACT inhaler INHALE 2 PUFFS BY MOUTH EVERY 12 HOURS 10.2 g 2   cyclobenzaprine  (FLEXERIL ) 5 MG tablet Take 1 tablet (5 mg total) by mouth 2 (two) times daily as needed for muscle spasms. 30 tablet 1   divalproex  (DEPAKOTE ) 250 MG DR tablet Take 1 tablet (250 mg total) by mouth 3 (three) times daily. 270 tablet 3   FLUoxetine  (PROZAC ) 20 MG capsule Take 1 capsule (20 mg total) by mouth daily. 30 capsule 5   gabapentin  (NEURONTIN ) 100 MG capsule TAKE 1  CAPSULE(100 MG) BY MOUTH TWICE DAILY 60 capsule 3   levalbuterol  (XOPENEX  HFA) 45 MCG/ACT inhaler INHALE 1 TO 2 PUFFS BY MOUTH EVERY 4 TO 6 HOURS AS NEEDED FOR WHEEZING 15 g 5   meclizine  (ANTIVERT ) 25 MG tablet Take 1 tablet (25 mg total) by mouth 3 (three) times daily as needed for dizziness. 30 tablet 0   Nebulizers (MY MDI PORTABLE NEBULISER) MISC 1 application by Does not apply  route as needed. 1 each 0   ondansetron  (ZOFRAN ) 4 MG tablet Take 1 tablet (4 mg total) by mouth every 8 (eight) hours as needed for nausea or vomiting. 20 tablet 0   pravastatin  (PRAVACHOL ) 40 MG tablet TAKE 1 TABLET(40 MG) BY MOUTH DAILY 90 tablet 3   Rimegepant Sulfate (NURTEC) 75 MG TBDP Take 1 tablet (75 mg total) by mouth every other day. 16 tablet 0   UNABLE TO FIND 1 application by Other route daily. Wrist splint for carpal tunnel syndrome: Apply it daily. 1 each 0   verapamil  (CALAN ) 120 MG tablet TAKE 1 TABLET(120 MG) BY MOUTH DAILY 90 tablet 1   No facility-administered medications prior to visit.    Allergies  Allergen Reactions   Bee Venom Swelling    ROS Review of Systems  Constitutional:  Positive for fatigue. Negative for chills and fever.  HENT:  Negative for congestion, sinus pressure, sinus pain and sore throat.   Eyes:  Negative for pain and discharge.  Respiratory:  Negative for cough and shortness of breath.   Cardiovascular:  Negative for chest pain and palpitations.  Gastrointestinal:  Negative for abdominal pain, diarrhea, nausea and vomiting.  Endocrine: Negative for polydipsia and polyuria.  Genitourinary:  Negative for dysuria and hematuria.  Musculoskeletal:  Positive for arthralgias (Right knee) and neck pain. Negative for neck stiffness.  Skin:  Negative for rash.  Neurological:  Positive for headaches. Negative for dizziness and weakness.  Psychiatric/Behavioral:  Positive for sleep disturbance. Negative for agitation and behavioral problems.       Objective:     Physical Exam Vitals reviewed.  Constitutional:      General: She is not in acute distress.    Appearance: She is obese. She is not diaphoretic.  HENT:     Head: Normocephalic and atraumatic.     Nose: Nose normal. No congestion.     Mouth/Throat:     Mouth: Mucous membranes are moist.     Pharynx: No posterior oropharyngeal erythema.  Eyes:     General: No scleral icterus.    Extraocular Movements: Extraocular movements intact.  Cardiovascular:     Rate and Rhythm: Normal rate and regular rhythm.     Heart sounds: Normal heart sounds. No murmur heard. Pulmonary:     Breath sounds: No wheezing or rales.  Abdominal:     Palpations: Abdomen is soft.     Tenderness: There is no abdominal tenderness.  Musculoskeletal:     Cervical back: Neck supple. Tenderness present. Pain with movement present.     Right knee: Swelling present. Tenderness present.     Right lower leg: No edema.     Left lower leg: No edema.  Skin:    General: Skin is warm.     Findings: No rash.  Neurological:     General: No focal deficit present.     Mental Status: She is alert and oriented to person, place, and time.     Motor: Weakness (RUE - 4/5, RLE - 4/5) present.  Psychiatric:        Mood and Affect: Mood normal.        Behavior: Behavior normal.     BP 121/80   Pulse 71   Ht 5' 2 (1.575 m)   Wt 194 lb 12.8 oz (88.4 kg)   SpO2 93%   BMI 35.63 kg/m  Wt Readings from Last 3 Encounters:  11/07/23 194 lb 12.8 oz (88.4 kg)  09/24/23 198 lb 3.2  oz (89.9 kg)  05/02/23 189 lb 3.2 oz (85.8 kg)    Lab Results  Component Value Date   TSH 1.350 10/29/2022   Lab Results  Component Value Date   WBC 6.6 10/29/2022   HGB 14.0 10/29/2022   HCT 40.9 10/29/2022   MCV 92 10/29/2022   PLT 293 10/29/2022   Lab Results  Component Value Date   NA 135 02/26/2023   K 4.0 02/26/2023   CO2 20 (L) 02/26/2023   GLUCOSE 91 02/26/2023   BUN 29 (H) 02/26/2023   CREATININE 0.56 02/26/2023   BILITOT  <0.2 10/29/2022   ALKPHOS 81 10/29/2022   AST 17 10/29/2022   ALT 16 10/29/2022   PROT 7.1 10/29/2022   ALBUMIN 4.1 10/29/2022   CALCIUM 9.2 02/26/2023   ANIONGAP 11 02/26/2023   EGFR 98 10/29/2022   Lab Results  Component Value Date   CHOL 176 10/29/2022   Lab Results  Component Value Date   HDL 51 10/29/2022   Lab Results  Component Value Date   LDLCALC 105 (H) 10/29/2022   Lab Results  Component Value Date   TRIG 108 10/29/2022   Lab Results  Component Value Date   CHOLHDL 3.5 10/29/2022   Lab Results  Component Value Date   HGBA1C 5.7 (H) 10/29/2022      Assessment & Plan:   Problem List Items Addressed This Visit       Cardiovascular and Mediastinum   Essential hypertension   BP Readings from Last 1 Encounters:  11/07/23 121/80   Well-controlled with Verapamil  120 mg QD Counseled for compliance with the medications Advised DASH diet and moderate exercise/walking, at least 150 mins/week      Relevant Orders   CMP14+EGFR   CBC with Differential/Platelet   Intractable chronic migraine without aura and without status migrainosus   She has tried Aleve  with mild relief, but advised to avoid daily intake Trial of Tylenol  and Excedrin Migraine alternatively She is on Depakote  and Verapamil  currently, which have ppx role in migraine as well Has Nurtec, sample was provided in the past - unable to take triptans due to h/o CVA      Relevant Medications   naproxen  (NAPROSYN ) 500 MG tablet     Nervous and Auditory   Seizure disorder (HCC)   Takes Depakote  250 mg TID Used to be followed by neurology- refilled Depakote  Check Depakote  level      Relevant Orders   TSH   CMP14+EGFR   CBC with Differential/Platelet     Musculoskeletal and Integument   Primary osteoarthritis of right knee   Chronic right knee pain and swelling, likely due to OA of knee Advised to take Tylenol  arthritis as needed for pain Naproxen  only for persistent pain If worsening  pain, will get imaging and/or orthopedic surgery evaluation      Relevant Medications   naproxen  (NAPROSYN ) 500 MG tablet     Other   Mixed hyperlipidemia   On pravastatin  40 mg once daily Check lipid profile      Relevant Orders   Lipid panel   Depression, recurrent (HCC)   Well controlled currently Has tried Lexapro  in the past Was uncontrolled due to stress at home, on Prozac  to 20 mg QD - now better      Prediabetes   Lab Results  Component Value Date   HGBA1C 5.7 (H) 10/29/2022   Advised to follow low-carb diet for now She needs to cut down sweet intake  Relevant Orders   Hemoglobin A1c   CMP14+EGFR   Encounter for general adult medical examination with abnormal findings - Primary   Physical exam as documented. Fasting blood tests ordered today. Advised to get Shingrix and Tdap vaccines at local pharmacy.      Other Visit Diagnoses       Vitamin D  deficiency       Relevant Orders   VITAMIN D  25 Hydroxy (Vit-D Deficiency, Fractures)       Meds ordered this encounter  Medications   naproxen  (NAPROSYN ) 500 MG tablet    Sig: Take 1 tablet (500 mg total) by mouth 2 (two) times daily with a meal.    Dispense:  60 tablet    Refill:  1    Follow-up: Return in about 6 months (around 05/09/2024).    Patty MARLA Blanch, MD

## 2023-11-07 NOTE — Assessment & Plan Note (Addendum)
She has tried Aleve with mild relief, but advised to avoid daily intake Trial of Tylenol and Excedrin Migraine alternatively She is on Depakote and Verapamil currently, which have ppx role in migraine as well Has Nurtec, sample was provided in the past - unable to take triptans due to h/o CVA

## 2023-11-07 NOTE — Assessment & Plan Note (Signed)
 On pravastatin  40 mg once daily Check lipid profile

## 2023-11-07 NOTE — Assessment & Plan Note (Signed)
 Lab Results  Component Value Date   HGBA1C 5.7 (H) 10/29/2022   Advised to follow low-carb diet for now She needs to cut down sweet intake

## 2023-11-07 NOTE — Assessment & Plan Note (Signed)
 Chronic right knee pain and swelling, likely due to OA of knee Advised to take Tylenol  arthritis as needed for pain Naproxen  only for persistent pain If worsening pain, will get imaging and/or orthopedic surgery evaluation

## 2023-12-17 ENCOUNTER — Other Ambulatory Visit: Payer: Self-pay | Admitting: Internal Medicine

## 2024-01-07 ENCOUNTER — Other Ambulatory Visit: Payer: Self-pay | Admitting: Internal Medicine

## 2024-01-07 DIAGNOSIS — M1711 Unilateral primary osteoarthritis, right knee: Secondary | ICD-10-CM

## 2024-01-11 ENCOUNTER — Other Ambulatory Visit: Payer: Self-pay | Admitting: Internal Medicine

## 2024-01-11 DIAGNOSIS — I10 Essential (primary) hypertension: Secondary | ICD-10-CM

## 2024-01-20 ENCOUNTER — Other Ambulatory Visit: Payer: Self-pay | Admitting: Internal Medicine

## 2024-02-23 ENCOUNTER — Other Ambulatory Visit: Payer: Self-pay | Admitting: Internal Medicine

## 2024-02-23 DIAGNOSIS — F339 Major depressive disorder, recurrent, unspecified: Secondary | ICD-10-CM

## 2024-03-03 ENCOUNTER — Emergency Department (HOSPITAL_COMMUNITY)

## 2024-03-03 ENCOUNTER — Emergency Department (HOSPITAL_COMMUNITY)
Admission: EM | Admit: 2024-03-03 | Discharge: 2024-03-03 | Disposition: A | Discharge status: Home / Self Care | Attending: Emergency Medicine | Admitting: Emergency Medicine

## 2024-03-03 ENCOUNTER — Encounter (HOSPITAL_COMMUNITY): Payer: Self-pay | Admitting: *Deleted

## 2024-03-03 ENCOUNTER — Other Ambulatory Visit: Payer: Self-pay

## 2024-03-03 DIAGNOSIS — Z79899 Other long term (current) drug therapy: Secondary | ICD-10-CM | POA: Insufficient documentation

## 2024-03-03 DIAGNOSIS — Z7982 Long term (current) use of aspirin: Secondary | ICD-10-CM | POA: Insufficient documentation

## 2024-03-03 DIAGNOSIS — M5412 Radiculopathy, cervical region: Secondary | ICD-10-CM

## 2024-03-03 DIAGNOSIS — Y92410 Unspecified street and highway as the place of occurrence of the external cause: Secondary | ICD-10-CM | POA: Insufficient documentation

## 2024-03-03 DIAGNOSIS — R4020 Unspecified coma: Secondary | ICD-10-CM

## 2024-03-03 DIAGNOSIS — Z87898 Personal history of other specified conditions: Secondary | ICD-10-CM

## 2024-03-03 DIAGNOSIS — Z8669 Personal history of other diseases of the nervous system and sense organs: Secondary | ICD-10-CM | POA: Insufficient documentation

## 2024-03-03 DIAGNOSIS — I10 Essential (primary) hypertension: Secondary | ICD-10-CM | POA: Insufficient documentation

## 2024-03-03 DIAGNOSIS — Z8673 Personal history of transient ischemic attack (TIA), and cerebral infarction without residual deficits: Secondary | ICD-10-CM | POA: Insufficient documentation

## 2024-03-03 DIAGNOSIS — R55 Syncope and collapse: Secondary | ICD-10-CM | POA: Insufficient documentation

## 2024-03-03 LAB — I-STAT CHEM 8, ED
BUN: 12 mg/dL (ref 6–20)
Calcium, Ion: 0.97 mmol/L — ABNORMAL LOW (ref 1.15–1.40)
Chloride: 107 mmol/L (ref 98–111)
Creatinine, Ser: 0.6 mg/dL (ref 0.44–1.00)
Glucose, Bld: 94 mg/dL (ref 70–99)
HCT: 45 % (ref 36.0–46.0)
Hemoglobin: 15.3 g/dL — ABNORMAL HIGH (ref 12.0–15.0)
Potassium: 3.9 mmol/L (ref 3.5–5.1)
Sodium: 139 mmol/L (ref 135–145)
TCO2: 20 mmol/L — ABNORMAL LOW (ref 22–32)

## 2024-03-03 LAB — URINE DRUG SCREEN
Amphetamines: NEGATIVE
Barbiturates: NEGATIVE
Benzodiazepines: NEGATIVE
Cocaine: POSITIVE — AB
Fentanyl: NEGATIVE
Methadone Scn, Ur: NEGATIVE
Opiates: NEGATIVE
Tetrahydrocannabinol: NEGATIVE

## 2024-03-03 LAB — COMPREHENSIVE METABOLIC PANEL WITH GFR
ALT: 18 U/L (ref 0–44)
AST: 20 U/L (ref 15–41)
Albumin: 4.2 g/dL (ref 3.5–5.0)
Alkaline Phosphatase: 94 U/L (ref 38–126)
Anion gap: 14 (ref 5–15)
BUN: 12 mg/dL (ref 6–20)
CO2: 21 mmol/L — ABNORMAL LOW (ref 22–32)
Calcium: 9.1 mg/dL (ref 8.9–10.3)
Chloride: 104 mmol/L (ref 98–111)
Creatinine, Ser: 0.54 mg/dL (ref 0.44–1.00)
GFR, Estimated: >60 mL/min (ref >60)
Glucose, Bld: 92 mg/dL (ref 70–99)
Potassium: 4 mmol/L (ref 3.5–5.1)
Sodium: 139 mmol/L (ref 135–145)
Total Bilirubin: 0.5 mg/dL (ref 0.0–1.2)
Total Protein: 7.5 g/dL (ref 6.5–8.1)

## 2024-03-03 LAB — CBC
HCT: 47.9 % — ABNORMAL HIGH (ref 36.0–46.0)
Hemoglobin: 15.1 g/dL — ABNORMAL HIGH (ref 12.0–15.0)
MCH: 32 pg (ref 26.0–34.0)
MCHC: 31.5 g/dL (ref 30.0–36.0)
MCV: 101.5 fL — ABNORMAL HIGH (ref 80.0–100.0)
Platelets: 226 K/uL (ref 150–400)
RBC: 4.72 MIL/uL (ref 3.87–5.11)
RDW: 12.5 % (ref 11.5–15.5)
WBC: 7.5 K/uL (ref 4.0–10.5)
nRBC: 0 % (ref 0.0–0.2)

## 2024-03-03 LAB — SAMPLE TO BLOOD BANK

## 2024-03-03 LAB — ETHANOL: Alcohol, Ethyl (B): <15 mg/dL (ref <15)

## 2024-03-03 LAB — URINALYSIS, ROUTINE W REFLEX MICROSCOPIC
Bilirubin Urine: NEGATIVE
Glucose, UA: NEGATIVE mg/dL
Hgb urine dipstick: NEGATIVE
Ketones, ur: NEGATIVE mg/dL
Leukocytes,Ua: NEGATIVE
Nitrite: NEGATIVE
Protein, ur: NEGATIVE mg/dL
Specific Gravity, Urine: 1.011 (ref 1.005–1.030)
pH: 7 (ref 5.0–8.0)

## 2024-03-03 LAB — VALPROIC ACID LEVEL: Valproic Acid Lvl: 31 ug/mL — ABNORMAL LOW (ref 50–100)

## 2024-03-03 LAB — ABO/RH: ABO/RH(D): B POS

## 2024-03-03 MED ORDER — CYCLOBENZAPRINE HCL 5 MG PO TABS: 5.0000 mg | ORAL_TABLET | Freq: Two times a day (BID) | ORAL | 1 refills | Status: AC | PRN
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
## 2024-03-22 ENCOUNTER — Telehealth: Payer: Self-pay

## 2024-03-22 ENCOUNTER — Other Ambulatory Visit: Payer: Self-pay | Admitting: Internal Medicine

## 2024-03-22 DIAGNOSIS — K2900 Acute gastritis without bleeding: Secondary | ICD-10-CM

## 2024-03-22 MED ORDER — ONDANSETRON HCL 4 MG PO TABS: 4.0000 mg | ORAL_TABLET | Freq: Three times a day (TID) | ORAL | 0 refills | Status: AC | PRN

## 2024-03-22 NOTE — Telephone Encounter (Signed)
 Copied from CRM [id number]. Topic: Appointments - Appointment Scheduling >> Mar 22, 2024 12:03 PM Patty [name] wrote: Patient has nausea,, no appt until March

## 2024-03-22 NOTE — Telephone Encounter (Signed)
 Copied from CRM #[id number]. Topic: Clinical - Prescription Issue >> Mar 22, 2024 12:08 PM Victoria B wrote: Reason for CRM: patient can't afford the $11.00 cos for her med, FLUoxetine  (PROZAC ) 20 MG capsule

## 2024-03-23 NOTE — Telephone Encounter (Signed)
Na, vm full

## 2024-03-23 NOTE — Telephone Encounter (Signed)
 Tried calling, n/a, vm full

## 2024-04-07 ENCOUNTER — Ambulatory Visit: Payer: Self-pay

## 2024-04-07 NOTE — Telephone Encounter (Signed)
 Patient called, unable to leave VM due to mailbox is full.   Copied from CRM [id number]. Topic: Clinical - Red Word Triage >> Apr 07, 2024  3:49 PM Patty [name] wrote: Red Word that prompted transfer to Nurse Triage: Swollen hand, right back and side hurts. Pt believes she has pneumonia. Transferred to NT

## 2024-04-08 NOTE — Telephone Encounter (Signed)
 Attempted main number and cell number unable to reach pt [email or web address].

## 2024-04-08 NOTE — Telephone Encounter (Signed)
 Second attempt

## 2024-04-08 NOTE — Telephone Encounter (Signed)
 Multiple attempt to contact her unable to reach her.

## 2024-04-14 ENCOUNTER — Other Ambulatory Visit: Payer: Self-pay | Admitting: Internal Medicine

## 2024-04-14 ENCOUNTER — Ambulatory Visit: Payer: Self-pay | Admitting: Internal Medicine

## 2024-04-14 DIAGNOSIS — G5603 Carpal tunnel syndrome, bilateral upper limbs: Secondary | ICD-10-CM

## 2024-04-14 DIAGNOSIS — F339 Major depressive disorder, recurrent, unspecified: Secondary | ICD-10-CM

## 2024-04-14 DIAGNOSIS — J4540 Moderate persistent asthma, uncomplicated: Secondary | ICD-10-CM

## 2024-04-14 DIAGNOSIS — E782 Mixed hyperlipidemia: Secondary | ICD-10-CM

## 2024-04-14 MED ORDER — GABAPENTIN 100 MG PO CAPS: 100.0000 mg | ORAL_CAPSULE | Freq: Two times a day (BID) | ORAL | 5 refills | Status: AC

## 2024-04-14 MED ORDER — ALBUTEROL SULFATE 0.63 MG/3ML IN NEBU: 1.0000 | INHALATION_SOLUTION | Freq: Four times a day (QID) | RESPIRATORY_TRACT | 3 refills | Status: AC | PRN

## 2024-04-14 MED ORDER — PRAVASTATIN SODIUM 40 MG PO TABS: 40.0000 mg | ORAL_TABLET | Freq: Every day | ORAL | 3 refills | Status: AC

## 2024-04-14 NOTE — Telephone Encounter (Signed)
 Copied from CRM #[id number]. Topic: Clinical - Medication Refill >> Apr 14, 2024  8:33 AM Donna BRAVO wrote: Medication:  FLUoxetine  (PROZAC ) 20 MG capsule  --- completely out  gabapentin  (NEURONTIN ) 100 MG capsule Inhalur not for nebulizer   Has the patient contacted their pharmacy? Yes Cost of medication stopped her from picking up medication   This is the patient's preferred pharmacy:   [id number] - [place], [place] - [address] SEC [address] [place] [address] Phone: [phone] Fax: [phone]   Is this the correct pharmacy for this prescription? Yes If no, delete pharmacy and type the correct one.   Has the prescription been filled recently? No  Is the patient out of the medication? Yes  Has the patient been seen for an appointment in the last year OR does the patient have an upcoming appointment? Yes  Can we respond through MyChart? No  Agent: Please be advised that Rx refills may take up to 3 business days. We ask that you follow-up with your pharmacy.

## 2024-04-14 NOTE — Telephone Encounter (Signed)
 FYI Only or Action Required?: Action required by provider: medication refill request, update on patient condition, and heading to hospital.  Patient was last seen in primary care on 11/07/2023 by Tobie Suzzane POUR, MD.  Called Nurse Triage reporting Tingling and Aphasia.  Symptoms began several weeks ago.  Interventions attempted: Rest, hydration, or home remedies.  Symptoms are: rapidly worsening.  Triage Disposition: Call EMS 911 Now - Pt having daughter drive her  Patient/caregiver understands and will follow disposition?: Yes       Copied from CRM [id number]. Topic: Clinical - Red Word Triage >> Apr 14, 2024  8:36 AM Patty [name] wrote: Red Word that prompted transfer to Nurse Triage:  right leg swollen pain expecually at night left hand swollen  the get numb at night Reason for Disposition  [1] Loss of speech or garbled speech AND [2] sudden onset AND [3] present now  Answer Assessment - Initial Assessment Questions This RN recommended pt be examined in hospital asap, declined pt offer to call 911, my daughter will take me to hospital. Pt verbalized understanding, will head to ER via loved one. Advised call 911 if any new or worsening symptoms. Pt also requesting refill of albuterol  inhaler, pravastatin , and gabapentin .     1. SYMPTOM: What is the main symptom you are concerned about? (e.g., weakness, numbness)     Numbness/tingling with carpal tunnel for while, getting worse, left hand  6. NEUROLOGIC SYMPTOMS: Have you had any of the following symptoms: headache, dizziness, vision loss, double vision, changes in speech, unsteady on your feet?    Pt confirms speech been slurred for past 2 weeks      7. OTHER SYMPTOMS: Do you have any other symptoms?     Hand swollen Knee swollen   Denies: Weakness New numbness/tingling  Protocols used: Neurologic Deficit-A-AH

## 2024-05-10 ENCOUNTER — Ambulatory Visit: Admitting: Internal Medicine
